# Patient Record
Sex: Female | Born: 1990 | Race: White | Hispanic: No | Marital: Married | Smoking: Never smoker
Health system: Southern US, Community
[De-identification: ages and names within clinical notes are randomized; demographics above are authoritative.]

## PROBLEM LIST (undated history)

## (undated) DIAGNOSIS — N920 Excessive and frequent menstruation with regular cycle: Secondary | ICD-10-CM

## (undated) DIAGNOSIS — T8859XA Other complications of anesthesia, initial encounter: Secondary | ICD-10-CM

## (undated) DIAGNOSIS — K219 Gastro-esophageal reflux disease without esophagitis: Secondary | ICD-10-CM

## (undated) DIAGNOSIS — Z8489 Family history of other specified conditions: Secondary | ICD-10-CM

## (undated) DIAGNOSIS — F419 Anxiety disorder, unspecified: Secondary | ICD-10-CM

## (undated) DIAGNOSIS — T4145XA Adverse effect of unspecified anesthetic, initial encounter: Secondary | ICD-10-CM

## (undated) DIAGNOSIS — Z87442 Personal history of urinary calculi: Secondary | ICD-10-CM

## (undated) DIAGNOSIS — R519 Headache, unspecified: Secondary | ICD-10-CM

## (undated) HISTORY — PX: ABDOMINAL HYSTERECTOMY: SHX81

## (undated) HISTORY — DX: Excessive and frequent menstruation with regular cycle: N92.0

## (undated) HISTORY — PX: TONSILLECTOMY: SUR1361

---
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
## 2013-11-11 DIAGNOSIS — L309 Dermatitis, unspecified: Secondary | ICD-10-CM | POA: Insufficient documentation

## 2015-01-04 ENCOUNTER — Encounter: Payer: Self-pay | Admitting: Emergency Medicine

## 2015-01-04 ENCOUNTER — Emergency Department (INDEPENDENT_AMBULATORY_CARE_PROVIDER_SITE_OTHER)
Admission: EM | Admit: 2015-01-04 | Discharge: 2015-01-04 | Disposition: A | Discharge status: Home / Self Care | Payer: 59 | Source: Home / Self Care | Attending: Family Medicine | Admitting: Family Medicine

## 2015-01-04 DIAGNOSIS — J029 Acute pharyngitis, unspecified: Secondary | ICD-10-CM

## 2015-01-04 DIAGNOSIS — R05 Cough: Secondary | ICD-10-CM

## 2015-01-04 DIAGNOSIS — R059 Cough, unspecified: Secondary | ICD-10-CM

## 2015-01-04 LAB — POCT RAPID STREP A (OFFICE): Rapid Strep A Screen: NEGATIVE

## 2015-01-04 MED ORDER — HYDROCODONE-HOMATROPINE 5-1.5 MG/5ML PO SYRP
5.0000 mL | ORAL_SOLUTION | Freq: Four times a day (QID) | ORAL | Status: DC | PRN
Start: 2015-01-04 — End: 2016-06-02

## 2015-01-04 NOTE — ED Provider Notes (Signed)
CSN: [id number]     Arrival date & time 01/04/15  1659 History   First MD Initiated Contact with Patient 01/04/15 1723     Chief Complaint  Patient presents with  . Sore Throat   (Consider location/radiation/quality/duration/timing/severity/associated sxs/prior Treatment) HPI  Pt is a 24yo female presenting to [facility] with c/o 3 day hx of gradually worsening sore throat with associated mild to moderately intermittent dry cough and hoarseness of voice.  Pt reports hx of strep but no recent known exposure.  She has not been trying any OTC medications PTA.  Denies fever, chills, n/v/d.  Denies body aches or shortness of breath.  History reviewed. No pertinent past medical history. Past Surgical History  Procedure Laterality Date  . Tonsillectomy     History reviewed. No pertinent family history. Social History  Substance Use Topics  . Smoking status: Never Smoker   . Smokeless tobacco: None  . Alcohol Use: Yes   OB History    No data available     Review of Systems  Constitutional: Positive for fatigue. Negative for fever and chills.  HENT: Positive for sore throat and voice change. Negative for congestion, ear pain and trouble swallowing.   Respiratory: Positive for cough and wheezing. Negative for shortness of breath.   Cardiovascular: Negative for chest pain and palpitations.  Gastrointestinal: Negative for nausea, vomiting, abdominal pain and diarrhea.  Musculoskeletal: Negative for myalgias, back pain and arthralgias.  Skin: Negative for rash.    Allergies  Review of patient's allergies indicates not on file.  Home Medications   Prior to Admission medications   Medication Sig Start Date End Date Taking? Authorizing Provider  HYDROcodone-homatropine (HYCODAN) 5-1.5 MG/5ML syrup Take 5 mLs by mouth every 6 (six) hours as needed for cough. 01/04/15   Junius Finner, PA-C   Meds Ordered and Administered this Visit  Medications - No data to display  BP 118/76 mmHg  Pulse 96   Temp(Src) 98.2 F (36.8 C) (Oral)  Wt 141 lb (63.957 kg)  SpO2 99% No data found.   Physical Exam  Constitutional: She appears well-developed and well-nourished. No distress.  HENT:  Head: Normocephalic and atraumatic.  Right Ear: Hearing, tympanic membrane, external ear and ear canal normal.  Left Ear: Hearing, tympanic membrane, external ear and ear canal normal.  Nose: Nose normal.  Mouth/Throat: Uvula is midline and mucous membranes are normal. Posterior oropharyngeal erythema present. No oropharyngeal exudate, posterior oropharyngeal edema or tonsillar abscesses.  Eyes: Conjunctivae are normal. No scleral icterus.  Neck: Normal range of motion. Neck supple.  Cardiovascular: Normal rate, regular rhythm and normal heart sounds.   Pulmonary/Chest: Effort normal and breath sounds normal. No respiratory distress. She has no wheezes. She has no rales. She exhibits no tenderness.  Abdominal: Soft. She exhibits no distension and no mass. There is no tenderness. There is no rebound and no guarding.  Musculoskeletal: Normal range of motion.  Lymphadenopathy:    She has no cervical adenopathy.  Neurological: She is alert.  Skin: Skin is warm and dry. She is not diaphoretic.  Nursing note and vitals reviewed.   ED Course  Procedures (including critical care time)  Labs Review Labs Reviewed  POCT RAPID STREP A (OFFICE)    Imaging Review No results found.    MDM   1. Acute pharyngitis, unspecified etiology   2. Cough    Pt c/o sore throat and cough for 3 days.  Rapid strep: negative  Reassured pt symptoms are likely viral. Encouraged conservative  treatment with fluids, rest, acetaminophen and ibuprofen. Pt states she has had tessalon before and it "does not work."  Advised she may try OTC cough medication.  Upon discharge pt requested a prescription cough medication Rx: hycodan. Advised it may cause drowsiness. Do not drive while taking. Pt declined work note. F/u with  PCP in 7-10 days if not improving, sooner if worsening. Patient verbalized understanding and agreement with treatment plan.     Junius FinnerErin O'Malley, PA-C 01/05/15 [id number]

## 2015-01-04 NOTE — ED Notes (Signed)
Pt c/o sore throat and dry cough x3 days. Denies fever.

## 2015-01-04 NOTE — Discharge Instructions (Signed)
You may take 400-600mg Ibuprofen (Motrin) every 6-8 hours for fever and pain  °Alternate with Tylenol  °You may take 500mg Tylenol every 4-6 hours as needed for fever and pain  °Follow-up with your primary care provider next week for recheck of symptoms if not improving.  °Be sure to drink plenty of fluids and rest, at least 8hrs of sleep a night, preferably more while you are sick. °Return urgent care or go to closest ER if you cannot keep down fluids/signs of dehydration, fever not reducing with Tylenol, difficulty breathing/wheezing, stiff neck, worsening condition, or other concerns (see below)  ° ° °Cool Mist Vaporizers °Vaporizers may help relieve the symptoms of a cough and cold. They add moisture to the air, which helps mucus to become thinner and less sticky. This makes it easier to breathe and cough up secretions. Cool mist vaporizers do not cause serious burns like hot mist vaporizers, which may also be called steamers or humidifiers. Vaporizers have not been proven to help with colds. You should not use a vaporizer if you are allergic to mold. °HOME CARE INSTRUCTIONS °· Follow the package instructions for the vaporizer. °· Do not use anything other than distilled water in the vaporizer. °· Do not run the vaporizer all of the time. This can cause mold or bacteria to grow in the vaporizer. °· Clean the vaporizer after each time it is used. °· Clean and dry the vaporizer well before storing it. °· Stop using the vaporizer if worsening respiratory symptoms develop. °  °This information is not intended to replace advice given to you by your health care provider. Make sure you discuss any questions you have with your health care provider. °  °Document Released: 10/01/2003 Document Revised: 01/08/2013 Document Reviewed: 05/23/2012 °Elsevier Interactive Patient Education ©2016 Elsevier Inc. ° °

## 2016-06-02 ENCOUNTER — Encounter: Payer: Self-pay | Admitting: Certified Nurse Midwife

## 2016-06-02 ENCOUNTER — Telehealth: Payer: Self-pay | Admitting: Certified Nurse Midwife

## 2016-06-02 ENCOUNTER — Ambulatory Visit (INDEPENDENT_AMBULATORY_CARE_PROVIDER_SITE_OTHER): Payer: 59 | Admitting: Certified Nurse Midwife

## 2016-06-02 VITALS — BP 120/70 | HR 70 | Wt 158.1 lb

## 2016-06-02 DIAGNOSIS — Z30011 Encounter for initial prescription of contraceptive pills: Secondary | ICD-10-CM

## 2016-06-02 DIAGNOSIS — Z30432 Encounter for removal of intrauterine contraceptive device: Secondary | ICD-10-CM | POA: Diagnosis not present

## 2016-06-02 MED ORDER — NORETHIN ACE-ETH ESTRAD-FE 1-20 MG-MCG PO TABS: 1.0000 | ORAL_TABLET | Freq: Every day | ORAL | 3 refills | Status: DC

## 2016-06-02 NOTE — Progress Notes (Signed)
Mary Parsons is a 26 y.o. year old [id number] Caucasian female who presents for removal of a Mirena IUD. Her Mirena IUD was placed 01/08/2016.   Patient's last menstrual period was 05/17/2016. BP 120/70   Pulse 70   Wt 158 lb 1.6 oz (71.7 kg)   LMP 05/17/2016 Comment: taking out today (06/02/16)  Time out was performed.  A medium plastic speculum was placed in the vagina.  The cervix was visualized, and the strings were visible. They were grasped and the Mirena was easily removed intact without complications.   Pt desires OCPs as pregnancy prevention. She was taking Junel prior to pregnancy and would like to resume.    Rx Junel 1/20 Fe, see orders. Advised back up method of prevention for 14 days.   Reviewed red flag symptoms and when to call including ACHES acronym.  RTC as needed.    Mary Parsons ,CNM

## 2016-06-02 NOTE — Patient Instructions (Signed)
Ethinyl Estradiol; Norethindrone Acetate; Ferrous fumarate tablets or capsules What is this medicine? ETHINYL ESTRADIOL; NORETHINDRONE ACETATE; FERROUS FUMARATE (ETH in il es tra DYE ole; nor eth IN drone AS e tate; FER us FUE ma rate) is an oral contraceptive. The products combine two types of female hormones, an estrogen and a progestin. They are used to prevent ovulation and pregnancy. Some products are also used to treat acne in females. This medicine may be used for other purposes; ask your health care provider or pharmacist if you have questions. COMMON BRAND NAME(S): Blisovi 24 Fe, Blisovi Fe, Estrostep Fe, Gildess 24 Fe, Gildess Fe 1.5/30, Gildess Fe 1/20, Junel Fe 1.5/30, Junel Fe 1/20, Junel Fe 24, Larin Fe, Lo Loestrin Fe, Loestrin 24 Fe, Loestrin FE 1.5/30, Loestrin FE 1/20, Lomedia 24 Fe, Microgestin 24 Fe, Microgestin Fe 1.5/30, Microgestin Fe 1/20, Tarina Fe 1/20, Taytulla, Tilia Fe, Tri-Legest Fe What should I tell my health care provider before I take this medicine? They need to know if you have any of these conditions: -abnormal vaginal bleeding -blood vessel disease -breast, cervical, endometrial, ovarian, liver, or uterine cancer -diabetes -gallbladder disease -heart disease or recent heart attack -high blood pressure -high cholesterol -history of blood clots -kidney disease -liver disease -migraine headaches -smoke tobacco -stroke -systemic lupus erythematosus (SLE) -an unusual or allergic reaction to estrogens, progestins, other medicines, foods, dyes, or preservatives -pregnant or trying to get pregnant -breast-feeding How should I use this medicine? Take this medicine by mouth. To reduce nausea, this medicine may be taken with food. Follow the directions on the prescription label. Take this medicine at the same time each day and in the order directed on the package. Do not take your medicine more often than directed. A patient package insert for the product will be  given with each prescription and refill. Read this sheet carefully each time. The sheet may change frequently. Contact your pediatrician regarding the use of this medicine in children. Special care may be needed. This medicine has been used in female children who have started having menstrual periods. Overdosage: If you think you have taken too much of this medicine contact a poison control center or emergency room at once. NOTE: This medicine is only for you. Do not share this medicine with others. What if I miss a dose? If you miss a dose, refer to the patient information sheet you received with your medicine for direction. If you miss more than one pill, this medicine may not be as effective and you may need to use another form of birth control. What may interact with this medicine? Do not take this medicine with the following medication: -dasabuvir; ombitasvir; paritaprevir; ritonavir -ombitasvir; paritaprevir; ritonavir This medicine may also interact with the following medications: -acetaminophen -antibiotics or medicines for infections, especially rifampin, rifabutin, rifapentine, and griseofulvin, and possibly penicillins or tetracyclines -aprepitant -ascorbic acid (vitamin C) -atorvastatin -barbiturate medicines, such as phenobarbital -bosentan -carbamazepine -caffeine -clofibrate -cyclosporine -dantrolene -doxercalciferol -felbamate -grapefruit juice -hydrocortisone -medicines for anxiety or sleeping problems, such as diazepam or temazepam -medicines for diabetes, including pioglitazone -mineral oil -modafinil -mycophenolate -nefazodone -oxcarbazepine -phenytoin -prednisolone -ritonavir or other medicines for HIV infection or AIDS -rosuvastatin -selegiline -soy isoflavones supplements -St. John's wort -tamoxifen or raloxifene -theophylline -thyroid hormones -topiramate -warfarin This list may not describe all possible interactions. Give your health care  provider a list of all the medicines, herbs, non-prescription drugs, or dietary supplements you use. Also tell them if you smoke, drink alcohol, or use illegal drugs. Some   items may interact with your medicine. What should I watch for while using this medicine? Visit your doctor or health care professional for regular checks on your progress. You will need a regular breast and pelvic exam and Pap smear while on this medicine. Use an additional method of contraception during the first cycle that you take these tablets. If you have any reason to think you are pregnant, stop taking this medicine right away and contact your doctor or health care professional. If you are taking this medicine for hormone related problems, it may take several cycles of use to see improvement in your condition. Smoking increases the risk of getting a blood clot or having a stroke while you are taking birth control pills, especially if you are more than 25 years old. You are strongly advised not to smoke. This medicine can make your body retain fluid, making your fingers, hands, or ankles swell. Your blood pressure can go up. Contact your doctor or health care professional if you feel you are retaining fluid. This medicine can make you more sensitive to the sun. Keep out of the sun. If you cannot avoid being in the sun, wear protective clothing and use sunscreen. Do not use sun lamps or tanning beds/booths. If you wear contact lenses and notice visual changes, or if the lenses begin to feel uncomfortable, consult your eye care specialist. In some women, tenderness, swelling, or minor bleeding of the gums may occur. Notify your dentist if this happens. Brushing and flossing your teeth regularly may help limit this. See your dentist regularly and inform your dentist of the medicines you are taking. If you are going to have elective surgery, you may need to stop taking this medicine before the surgery. Consult your health care  professional for advice. This medicine does not protect you against HIV infection (AIDS) or any other sexually transmitted diseases. What side effects may I notice from receiving this medicine? Side effects that you should report to your doctor or health care professional as soon as possible: -allergic reactions like skin rash, itching or hives, swelling of the face, lips, or tongue -breast tissue changes or discharge -changes in vaginal bleeding during your period or between your periods -changes in vision -chest pain -confusion -coughing up blood -dizziness -feeling faint or lightheaded -headaches or migraines -leg, arm or groin pain -loss of balance or coordination -severe or sudden headaches -stomach pain (severe) -sudden shortness of breath -sudden numbness or weakness of the face, arm or leg -symptoms of vaginal infection like itching, irritation or unusual discharge -tenderness in the upper abdomen -trouble speaking or understanding -vomiting -yellowing of the eyes or skin Side effects that usually do not require medical attention (report to your doctor or health care professional if they continue or are bothersome): -breakthrough bleeding and spotting that continues beyond the 3 initial cycles of pills -breast tenderness -mood changes, anxiety, depression, frustration, anger, or emotional outbursts -increased sensitivity to sun or ultraviolet light -nausea -skin rash, acne, or brown spots on the skin -weight gain (slight) This list may not describe all possible side effects. Call your doctor for medical advice about side effects. You may report side effects to FDA at 1-800-FDA-1088. Where should I keep my medicine? Keep out of the reach of children. Store at room temperature between 15 and 30 degrees C (59 and 86 degrees F). Throw away any unused medicine after the expiration date. NOTE: This sheet is a summary. It may not cover all possible information. If you   have  questions about this medicine, talk to your doctor, pharmacist, or health care provider.  2018 Elsevier/Gold Standard (2015-09-14 08:04:41)  

## 2016-06-02 NOTE — Telephone Encounter (Signed)
Please resend scripts to the [facility]  This has been updated in the chart

## 2016-06-06 MED ORDER — NORETHIN ACE-ETH ESTRAD-FE 1-20 MG-MCG PO TABS: 1.0000 | ORAL_TABLET | Freq: Every day | ORAL | 3 refills | Status: DC

## 2016-06-06 NOTE — Telephone Encounter (Signed)
Patient called again this am [facility]   Thanks

## 2016-06-06 NOTE — Telephone Encounter (Signed)
Refill sent to [facility].

## 2016-08-15 ENCOUNTER — Encounter: Payer: Self-pay | Admitting: Certified Nurse Midwife

## 2016-08-15 ENCOUNTER — Ambulatory Visit (INDEPENDENT_AMBULATORY_CARE_PROVIDER_SITE_OTHER): Payer: 59 | Admitting: Certified Nurse Midwife

## 2016-08-15 VITALS — BP 119/78 | HR 75 | Ht 62.0 in | Wt 153.0 lb

## 2016-08-15 DIAGNOSIS — N939 Abnormal uterine and vaginal bleeding, unspecified: Secondary | ICD-10-CM

## 2016-08-15 DIAGNOSIS — Z842 Family history of other diseases of the genitourinary system: Secondary | ICD-10-CM

## 2016-08-15 LAB — POCT URINE PREGNANCY: PREG TEST UR: NEGATIVE

## 2016-08-15 MED ORDER — IBUPROFEN 800 MG PO TABS: 800.0000 mg | ORAL_TABLET | Freq: Three times a day (TID) | ORAL | 1 refills | Status: DC | PRN

## 2016-08-15 NOTE — Patient Instructions (Signed)
Abnormal Uterine Bleeding Abnormal uterine bleeding can affect women at various stages in life, including teenagers, women in their reproductive years, pregnant women, and women who have reached menopause. Several kinds of uterine bleeding are considered abnormal, including:  Bleeding or spotting between periods.  Bleeding after sexual intercourse.  Bleeding that is heavier or more than normal.  Periods that last longer than usual.  Bleeding after menopause.  Many cases of abnormal uterine bleeding are minor and simple to treat, while others are more serious. Any type of abnormal bleeding should be evaluated by your health care provider. Treatment will depend on the cause of the bleeding. Follow these instructions at home: Monitor your condition for any changes. The following actions may help to alleviate any discomfort you are experiencing:  Avoid the use of tampons and douches as directed by your health care provider.  Change your pads frequently.  You should get regular pelvic exams and Pap tests. Keep all follow-up appointments for diagnostic tests as directed by your health care provider. Contact a health care provider if:  Your bleeding lasts more than 1 week.  You feel dizzy at times. Get help right away if:  You pass out.  You are changing pads every 15 to 30 minutes.  You have abdominal pain.  You have a fever.  You become sweaty or weak.  You are passing large blood clots from the vagina.  You start to feel nauseous and vomit. This information is not intended to replace advice given to you by your health care provider. Make sure you discuss any questions you have with your health care provider. Document Released: 01/03/2005 Document Revised: 06/17/2015 Document Reviewed: 08/02/2012 Elsevier Interactive Patient Education  2017 [employer]  Endometriosis Endometriosis is a condition in which the tissue that lines the uterus (endometrium) grows outside of its  normal location. The tissue may grow in many locations close to the uterus, but it commonly grows on the ovaries, fallopian tubes, vagina, or bowel. When the uterus sheds the endometrium every menstrual cycle, there is bleeding wherever the endometrial tissue is located. This can cause pain because blood is irritating to tissues that are not normally exposed to it. What are the causes? The cause of endometriosis is not known. What increases the risk? You may be more likely to develop endometriosis if you:  Have a family history of endometriosis.  Have never given birth.  Started your period at age 25 or younger.  Have high levels of estrogen in your body.  Were exposed to a certain medicine (diethylstilbestrol) before you were born (in utero).  Had low birth weight.  Were born as a twin, triplet, or other multiple.  Have a BMI of less than 25. BMI is an estimate of body fat and is calculated from height and weight.  What are the signs or symptoms? Often, there are no symptoms of this condition. If you do have symptoms, they may:  Vary depending on where your endometrial tissue is growing.  Occur during your menstrual period (most common) or midcycle.  Come and go, or you may go months with no symptoms at all.  Stop with menopause.  Symptoms may include:  Pain in the back or abdomen.  Heavier bleeding during periods.  Pain during sex.  Painful bowel movements.  Infertility.  Pelvic pain.  Bleeding more than once a month.  How is this diagnosed? This condition is diagnosed based on your symptoms and a physical exam. You may have tests, such as:  Blood tests and urine tests. These may be done to help rule out other possible causes of your symptoms.  Ultrasound, to look for abnormal tissues.  An X-ray of the lower bowel (barium enema).  An ultrasound that is done through the vagina (transvaginally).  CT scan.  MRI.  Laparoscopy. In this procedure, a  lighted, pencil-sized instrument called a laparoscope is inserted into your abdomen through an incision. The laparoscope allows your health care provider to look at the organs inside your body and check for abnormal tissue to confirm the diagnosis. If abnormal tissue is found, your health care provider may remove a small piece of tissue (biopsy) to be examined under a microscope.  How is this treated? Treatment for this condition may include:  Medicines to relieve pain, such as NSAIDs.  Hormone therapy. This involves using artificial (synthetic) hormones to reduce endometrial tissue growth. Your health care provider may recommend using a hormonal form of birth control, or other medicines.  Surgery. This may be done to remove abnormal endometrial tissue. ? In some cases, tissue may be removed using a laparoscope and a laser (laparoscopic laser treatment). ? In severe cases, surgery may be done to remove the fallopian tubes, uterus, and ovaries (hysterectomy).  Follow these instructions at home:  Take over-the-counter and prescription medicines only as told by your health care provider.  Do not drive or use heavy machinery while taking prescription pain medicine.  Try to avoid activities that cause pain, including sexual activity.  Keep all follow-up visits as told by your health care provider. This is important. Contact a health care provider if:  You have pain in the area between your hip bones (pelvic area) that occurs: ? Before, during, or after your period. ? In between your period and gets worse during your period. ? During or after sex. ? With bowel movements or urination, especially during your period.  You have problems getting pregnant.  You have a fever. Get help right away if:  You have severe pain that does not get better with medicine.  You have severe nausea and vomiting, or you cannot eat without vomiting.  You have pain that affects only the lower, right side of  your abdomen.  You have abdominal pain that gets worse.  You have abdominal swelling.  You have blood in your stool. This information is not intended to replace advice given to you by your health care provider. Make sure you discuss any questions you have with your health care provider. Document Released: 01/01/2000 Document Revised: 10/09/2015 Document Reviewed: 06/06/2015 Elsevier Interactive Patient Education  [employer]2018 Elsevier Inc.

## 2016-08-16 LAB — BETA HCG QUANT (REF LAB): hCG Quant: <1 m[IU]/mL

## 2016-08-16 LAB — CBC
HEMATOCRIT: 43 % (ref 34.0–46.6)
Hemoglobin: 14.7 g/dL (ref 11.1–15.9)
MCH: 31.3 pg (ref 26.6–33.0)
MCHC: 34.2 g/dL (ref 31.5–35.7)
MCV: 92 fL (ref 79–97)
PLATELETS: 284 10*3/uL (ref 150–379)
RBC: 4.7 x10E6/uL (ref 3.77–5.28)
RDW: 12.6 % (ref 12.3–15.4)
WBC: 6 10*3/uL (ref 3.4–10.8)

## 2016-08-16 LAB — FSH/LH
FSH: 6.1 m[IU]/mL
LH: 7.8 m[IU]/mL

## 2016-08-16 LAB — ESTRADIOL: ESTRADIOL: 23.8 pg/mL

## 2016-08-16 LAB — PROLACTIN: PROLACTIN: 8.4 ng/mL (ref 4.8–23.3)

## 2016-08-16 LAB — TSH: TSH: 1.67 u[IU]/mL (ref 0.450–4.500)

## 2016-08-16 LAB — FERRITIN: FERRITIN: 164 ng/mL — AB (ref 15–150)

## 2016-08-16 NOTE — Progress Notes (Signed)
GYN ENCOUNTER NOTE  Subjective:       Mary Parsons is a 26 y.o. [id number] female is here for gynecologic evaluation of the following issues: abnormal uterine bleeding.   Mary CoolerChristen reports heavy vaginal bleeding with small clots x 1 day. Bleeding saturates one (1) tampon per hour.   Denies difficulty breathing or respiratory distress, chest pain, abdominal pain, dysuria, dyspareunia, and leg pain or swelling.   Family history significant for endometriosis and several relatives had hysterectomies prior to age 25.      Gynecologic History  Patient's last menstrual period was 08/01/2016.  Contraception: OCP (estrogen/progesterone)  Last Pap: unknown.   Obstetric History  OB History  Gravida Para Term Preterm AB Living  1 1 1     1   SAB TAB Ectopic Multiple Live Births          1    # Outcome Date GA Lbr Len/2nd Weight Sex Delivery Anes PTL Lv  1 Term 08/21/15 [id number]  4 lb 2.2 oz (1.878 kg) M CS-Unspec   LIV     Complications: Fetal Intolerance      Past Medical History:  Diagnosis Date  . Heavy periods     Past Surgical History:  Procedure Laterality Date  . CESAREAN SECTION    . TONSILLECTOMY      Current Outpatient Prescriptions on File Prior to Visit  Medication Sig Dispense Refill  . norethindrone-ethinyl estradiol (JUNEL FE,GILDESS FE,LOESTRIN FE) 1-20 MG-MCG tablet Take 1 tablet by mouth daily. 3 Package 3   No current facility-administered medications on file prior to visit.     No Known Allergies  Social History   Social History  . Marital status: Married    Spouse name: N/A  . Number of children: N/A  . Years of education: N/A   Occupational History  . Not on file.   Social History Main Topics  . Smoking status: Never Smoker  . Smokeless tobacco: Never Used  . Alcohol use Yes  . Drug use: No  . Sexual activity: Yes   Other Topics Concern  . Not on file   Social History Narrative  . No narrative on file    Family History  Problem  Relation Age of Onset  . Endometriosis Mother   . Endometriosis Maternal Aunt   . Endometriosis Maternal Grandmother     The following portions of the patient's history were reviewed and updated as appropriate: allergies, current medications, past family history, past medical history, past social history, past surgical history and problem list.  Review of Systems  Review of Systems - Negative except as noted above.  History obtained from the patient.   Objective:   BP 119/78   Pulse 75   Ht 5\' 2"  (1.575 m)   Wt 153 lb (69.4 kg)   LMP 08/01/2016   BMI 27.98 kg/m    CONSTITUTIONAL: Well-developed, well-nourished female in no acute distress.   ABDOMEN: Soft, non distended; Non tender.  No Organomegaly.  PELVIC:  External Genitalia: Normall  Vagina: Normal  Cervix: Normal, NuSwab collected  Uterus: Normal size, shape,consistency, mobile  Adnexa: Normal  UPT negative.   Assessment:   1. Abnormal uterine bleeding  - CBC - Ferritin - Estradiol - FSH/LH - TSH - Prolactin - Beta HCG, Quant - NuSwab Vaginitis Plus (VG+) - US Pelvis Complete; Future - US Transvaginal Non-OB; Future - POCT urine pregnancy  2. Family history of endometriosis  - US Pelvis Complete; Future - US Transvaginal Non-OB; Future  Plan:   Labs: See orders.   NuSwab collected will contact pt with results.   Reviewed red flag symptoms and when to call.   RTC x 1 weeks for ultrasound, RTC x 2 weeks for results review.    Gunnar BullaJenkins Michelle Terriona Horlacher, CNM

## 2016-08-19 LAB — NUSWAB VAGINITIS PLUS (VG+)
CANDIDA ALBICANS, NAA: NEGATIVE
CANDIDA GLABRATA, NAA: NEGATIVE
Chlamydia trachomatis, NAA: NEGATIVE
NEISSERIA GONORRHOEAE, NAA: NEGATIVE
Trich vag by NAA: NEGATIVE

## 2016-08-22 ENCOUNTER — Ambulatory Visit (INDEPENDENT_AMBULATORY_CARE_PROVIDER_SITE_OTHER): Payer: 59

## 2016-08-22 ENCOUNTER — Encounter: Payer: Self-pay | Admitting: Certified Nurse Midwife

## 2016-08-22 DIAGNOSIS — Z842 Family history of other diseases of the genitourinary system: Secondary | ICD-10-CM

## 2016-08-22 DIAGNOSIS — N939 Abnormal uterine and vaginal bleeding, unspecified: Secondary | ICD-10-CM | POA: Diagnosis not present

## 2016-08-26 ENCOUNTER — Ambulatory Visit (INDEPENDENT_AMBULATORY_CARE_PROVIDER_SITE_OTHER): Payer: 59 | Admitting: Certified Nurse Midwife

## 2016-08-26 ENCOUNTER — Encounter: Payer: Self-pay | Admitting: Certified Nurse Midwife

## 2016-08-26 VITALS — BP 112/61 | HR 71 | Ht 62.0 in | Wt 154.0 lb

## 2016-08-26 DIAGNOSIS — N939 Abnormal uterine and vaginal bleeding, unspecified: Secondary | ICD-10-CM

## 2016-08-26 DIAGNOSIS — Z842 Family history of other diseases of the genitourinary system: Secondary | ICD-10-CM

## 2016-08-26 DIAGNOSIS — Z09 Encounter for follow-up examination after completed treatment for conditions other than malignant neoplasm: Secondary | ICD-10-CM | POA: Diagnosis not present

## 2016-08-26 NOTE — Patient Instructions (Signed)
Endometriosis Endometriosis is a condition in which the tissue that lines the uterus (endometrium) grows outside of its normal location. The tissue may grow in many locations close to the uterus, but it commonly grows on the ovaries, fallopian tubes, vagina, or bowel. When the uterus sheds the endometrium every menstrual cycle, there is bleeding wherever the endometrial tissue is located. This can cause pain because blood is irritating to tissues that are not normally exposed to it. What are the causes? The cause of endometriosis is not known. What increases the risk? You may be more likely to develop endometriosis if you:  Have a family history of endometriosis.  Have never given birth.  Started your period at age 10 or younger.  Have high levels of estrogen in your body.  Were exposed to a certain medicine (diethylstilbestrol) before you were born (in utero).  Had low birth weight.  Were born as a twin, triplet, or other multiple.  Have a BMI of less than 25. BMI is an estimate of body fat and is calculated from height and weight.  What are the signs or symptoms? Often, there are no symptoms of this condition. If you do have symptoms, they may:  Vary depending on where your endometrial tissue is growing.  Occur during your menstrual period (most common) or midcycle.  Come and go, or you may go months with no symptoms at all.  Stop with menopause.  Symptoms may include:  Pain in the back or abdomen.  Heavier bleeding during periods.  Pain during sex.  Painful bowel movements.  Infertility.  Pelvic pain.  Bleeding more than once a month.  How is this diagnosed? This condition is diagnosed based on your symptoms and a physical exam. You may have tests, such as:  Blood tests and urine tests. These may be done to help rule out other possible causes of your symptoms.  Ultrasound, to look for abnormal tissues.  An X-ray of the lower bowel (barium enema).  An  ultrasound that is done through the vagina (transvaginally).  CT scan.  MRI.  Laparoscopy. In this procedure, a lighted, pencil-sized instrument called a laparoscope is inserted into your abdomen through an incision. The laparoscope allows your health care provider to look at the organs inside your body and check for abnormal tissue to confirm the diagnosis. If abnormal tissue is found, your health care provider may remove a small piece of tissue (biopsy) to be examined under a microscope.  How is this treated? Treatment for this condition may include:  Medicines to relieve pain, such as NSAIDs.  Hormone therapy. This involves using artificial (synthetic) hormones to reduce endometrial tissue growth. Your health care provider may recommend using a hormonal form of birth control, or other medicines.  Surgery. This may be done to remove abnormal endometrial tissue. ? In some cases, tissue may be removed using a laparoscope and a laser (laparoscopic laser treatment). ? In severe cases, surgery may be done to remove the fallopian tubes, uterus, and ovaries (hysterectomy).  Follow these instructions at home:  Take over-the-counter and prescription medicines only as told by your health care provider.  Do not drive or use heavy machinery while taking prescription pain medicine.  Try to avoid activities that cause pain, including sexual activity.  Keep all follow-up visits as told by your health care provider. This is important. Contact a health care provider if:  You have pain in the area between your hip bones (pelvic area) that occurs: ? Before, during, or   after your period. ? In between your period and gets worse during your period. ? During or after sex. ? With bowel movements or urination, especially during your period.  You have problems getting pregnant.  You have a fever. Get help right away if:  You have severe pain that does not get better with medicine.  You have severe  nausea and vomiting, or you cannot eat without vomiting.  You have pain that affects only the lower, right side of your abdomen.  You have abdominal pain that gets worse.  You have abdominal swelling.  You have blood in your stool. This information is not intended to replace advice given to you by your health care provider. Make sure you discuss any questions you have with your health care provider. Document Released: 01/01/2000 Document Revised: 10/09/2015 Document Reviewed: 06/06/2015 Elsevier Interactive Patient Education  2018 [employer] Diagnostic Laparoscopy A diagnostic laparoscopy is a procedure to diagnose diseases in the abdomen. During the procedure, a thin, lighted, pencil-sized instrument called a laparoscope is inserted into the abdomen through an incision. The laparoscope allows your health care provider to look at the organs inside your body. Tell a health care provider about:  Any allergies you have.  All medicines you are taking, including vitamins, herbs, eye drops, creams, and over-the-counter medicines.  Any problems you or family members have had with anesthetic medicines.  Any blood disorders you have.  Any surgeries you have had.  Any medical conditions you have. What are the risks? Generally, this is a safe procedure. However, problems can occur, which may include:  Infection.  Bleeding.  Damage to other organs.  Allergic reaction to the anesthetics used during the procedure.  What happens before the procedure?  Do not eat or drink anything after midnight on the night before the procedure or as directed by your health care provider.  Ask your health care provider about: ? Changing or stopping your regular medicines. ? Taking medicines such as aspirin and ibuprofen. These medicines can thin your blood. Do not take these medicines before your procedure if your health care provider instructs you not to.  Plan to have someone take you home after  the procedure. What happens during the procedure?  You may be given a medicine to help you relax (sedative).  You will be given a medicine to make you sleep (general anesthetic).  Your abdomen will be inflated with a gas. This will make your organs easier to see.  Small incisions will be made in your abdomen.  A laparoscope and other small instruments will be inserted into the abdomen through the incisions.  A tissue sample may be removed from an organ in the abdomen for examination.  The instruments will be removed from the abdomen.  The gas will be released.  The incisions will be closed with stitches (sutures). What happens after the procedure? Your blood pressure, heart rate, breathing rate, and blood oxygen level will be monitored often until the medicines you were given have worn off. This information is not intended to replace advice given to you by your health care provider. Make sure you discuss any questions you have with your health care provider. Document Released: 04/11/2000 Document Revised: 05/14/2015 Document Reviewed: 08/16/2013 Elsevier Interactive Patient Education  2018 [employer] Leuprolide depot injection What is this medicine? LEUPROLIDE (loo PROE lide) is a man-made protein that acts like a natural hormone in the body. It decreases testosterone in men and decreases estrogen in women. In men, this  medicine is used to treat advanced prostate cancer. In women, some forms of this medicine may be used to treat endometriosis, uterine fibroids, or other female hormone-related problems. This medicine may be used for other purposes; ask your health care provider or pharmacist if you have questions. COMMON BRAND NAME(S): Eligard, Lupron Depot, Lupron Depot-Ped, Viadur What should I tell my health care provider before I take this medicine? They need to know if you have any of these conditions: -diabetes -heart disease or previous heart attack -high blood  pressure -high cholesterol -mental illness -osteoporosis -pain or difficulty passing urine -seizures -spinal cord metastasis -stroke -suicidal thoughts, plans, or attempt; a previous suicide attempt by you or a family member -tobacco smoker -unusual vaginal bleeding (women) -an unusual or allergic reaction to leuprolide, benzyl alcohol, other medicines, foods, dyes, or preservatives -pregnant or trying to get pregnant -breast-feeding How should I use this medicine? This medicine is for injection into a muscle or for injection under the skin. It is given by a health care professional in a hospital or clinic setting. The specific product will determine how it will be given to you. Make sure you understand which product you receive and how often you will receive it. Talk to your pediatrician regarding the use of this medicine in children. Special care may be needed. Overdosage: If you think you have taken too much of this medicine contact a poison control center or emergency room at once. NOTE: This medicine is only for you. Do not share this medicine with others. What if I miss a dose? It is important not to miss a dose. Call your doctor or health care professional if you are unable to keep an appointment. Depot injections: Depot injections are given either once-monthly, every 12 weeks, every 16 weeks, or every 24 weeks depending on the product you are prescribed. The product you are prescribed will be based on if you are female or female, and your condition. Make sure you understand your product and dosing. What may interact with this medicine? Do not take this medicine with any of the following medications: -chasteberry This medicine may also interact with the following medications: -herbal or dietary supplements, like black cohosh or DHEA -female hormones, like estrogens or progestins and birth control pills, patches, rings, or injections -female hormones, like testosterone This list may  not describe all possible interactions. Give your health care provider a list of all the medicines, herbs, non-prescription drugs, or dietary supplements you use. Also tell them if you smoke, drink alcohol, or use illegal drugs. Some items may interact with your medicine. What should I watch for while using this medicine? Visit your doctor or health care professional for regular checks on your progress. During the first weeks of treatment, your symptoms may get worse, but then will improve as you continue your treatment. You may get hot flashes, increased bone pain, increased difficulty passing urine, or an aggravation of nerve symptoms. Discuss these effects with your doctor or health care professional, some of them may improve with continued use of this medicine. Female patients may experience a menstrual cycle or spotting during the first months of therapy with this medicine. If this continues, contact your doctor or health care professional. What side effects may I notice from receiving this medicine? Side effects that you should report to your doctor or health care professional as soon as possible: -allergic reactions like skin rash, itching or hives, swelling of the face, lips, or tongue -breathing problems -chest pain -depression  or memory disorders -pain in your legs or groin -pain at site where injected or implanted -seizures -severe headache -swelling of the feet and legs -suicidal thoughts or other mood changes -visual changes -vomiting Side effects that usually do not require medical attention (report to your doctor or health care professional if they continue or are bothersome): -breast swelling or tenderness -decrease in sex drive or performance -diarrhea -hot flashes -loss of appetite -muscle, joint, or bone pains -nausea -redness or irritation at site where injected or implanted -skin problems or acne This list may not describe all possible side effects. Call your doctor  for medical advice about side effects. You may report side effects to FDA at 1-800-FDA-1088. Where should I keep my medicine? This drug is given in a hospital or clinic and will not be stored at home. NOTE: This sheet is a summary. It may not cover all possible information. If you have questions about this medicine, talk to your doctor, pharmacist, or health care provider.  2018 Elsevier/Gold Standard (2015-06-18 09:45:53) Medroxyprogesterone injection [Contraceptive] What is this medicine? MEDROXYPROGESTERONE (me DROX ee proe JES te rone) contraceptive injections prevent pregnancy. They provide effective birth control for 3 months. Depo-subQ Provera 104 is also used for treating pain related to endometriosis. This medicine may be used for other purposes; ask your health care provider or pharmacist if you have questions. COMMON BRAND NAME(S): Depo-Provera, Depo-subQ Provera 104 What should I tell my health care provider before I take this medicine? They need to know if you have any of these conditions: -frequently drink alcohol -asthma -blood vessel disease or a history of a blood clot in the lungs or legs -bone disease such as osteoporosis -breast cancer -diabetes -eating disorder (anorexia nervosa or bulimia) -high blood pressure -HIV infection or AIDS -kidney disease -liver disease -mental depression -migraine -seizures (convulsions) -stroke -tobacco smoker -vaginal bleeding -an unusual or allergic reaction to medroxyprogesterone, other hormones, medicines, foods, dyes, or preservatives -pregnant or trying to get pregnant -breast-feeding How should I use this medicine? Depo-Provera Contraceptive injection is given into a muscle. Depo-subQ Provera 104 injection is given under the skin. These injections are given by a health care professional. You must not be pregnant before getting an injection. The injection is usually given during the first 5 days after the start of a menstrual  period or 6 weeks after delivery of a baby. Talk to your pediatrician regarding the use of this medicine in children. Special care may be needed. These injections have been used in female children who have started having menstrual periods. Overdosage: If you think you have taken too much of this medicine contact a poison control center or emergency room at once. NOTE: This medicine is only for you. Do not share this medicine with others. What if I miss a dose? Try not to miss a dose. You must get an injection once every 3 months to maintain birth control. If you cannot keep an appointment, call and reschedule it. If you wait longer than 13 weeks between Depo-Provera contraceptive injections or longer than 14 weeks between Depo-subQ Provera 104 injections, you could get pregnant. Use another method for birth control if you miss your appointment. You may also need a pregnancy test before receiving another injection. What may interact with this medicine? Do not take this medicine with any of the following medications: -bosentan This medicine may also interact with the following medications: -aminoglutethimide -antibiotics or medicines for infections, especially rifampin, rifabutin, rifapentine, and griseofulvin -aprepitant -barbiturate medicines such  as phenobarbital or primidone -bexarotene -carbamazepine -medicines for seizures like ethotoin, felbamate, oxcarbazepine, phenytoin, topiramate -modafinil -St. John's wort This list may not describe all possible interactions. Give your health care provider a list of all the medicines, herbs, non-prescription drugs, or dietary supplements you use. Also tell them if you smoke, drink alcohol, or use illegal drugs. Some items may interact with your medicine. What should I watch for while using this medicine? This drug does not protect you against HIV infection (AIDS) or other sexually transmitted diseases. Use of this product may cause you to lose calcium  from your bones. Loss of calcium may cause weak bones (osteoporosis). Only use this product for more than 2 years if other forms of birth control are not right for you. The longer you use this product for birth control the more likely you will be at risk for weak bones. Ask your health care professional how you can keep strong bones. You may have a change in bleeding pattern or irregular periods. Many females stop having periods while taking this drug. If you have received your injections on time, your chance of being pregnant is very low. If you think you may be pregnant, see your health care professional as soon as possible. Tell your health care professional if you want to get pregnant within the next year. The effect of this medicine may last a long time after you get your last injection. What side effects may I notice from receiving this medicine? Side effects that you should report to your doctor or health care professional as soon as possible: -allergic reactions like skin rash, itching or hives, swelling of the face, lips, or tongue -breast tenderness or discharge -breathing problems -changes in vision -depression -feeling faint or lightheaded, falls -fever -pain in the abdomen, chest, groin, or leg -problems with balance, talking, walking -unusually weak or tired -yellowing of the eyes or skin Side effects that usually do not require medical attention (report to your doctor or health care professional if they continue or are bothersome): -acne -fluid retention and swelling -headache -irregular periods, spotting, or absent periods -temporary pain, itching, or skin reaction at site where injected -weight gain This list may not describe all possible side effects. Call your doctor for medical advice about side effects. You may report side effects to FDA at 1-800-FDA-1088. Where should I keep my medicine? This does not apply. The injection will be given to you by a health care  professional. NOTE: This sheet is a summary. It may not cover all possible information. If you have questions about this medicine, talk to your doctor, pharmacist, or health care provider.  2018 Elsevier/Gold Standard (2008-01-25 18:37:56) Norethindrone tablets (contraception) What is this medicine? NORETHINDRONE (nor eth IN drone) is an oral contraceptive. The product contains a female hormone known as a progestin. It is used to prevent pregnancy. This medicine may be used for other purposes; ask your health care provider or pharmacist if you have questions. COMMON BRAND NAME(S): Camila, Deblitane 28-Day, Errin, Heather, [place], Jolivette, [place], Nor-QD, Nora-BE, Norlyroc, Ortho Micronor, [employer] 28-Day What should I tell my health care provider before I take this medicine? They need to know if you have any of these conditions: -blood vessel disease or blood clots -breast, cervical, or vaginal cancer -diabetes -heart disease -kidney disease -liver disease -mental depression -migraine -seizures -stroke -vaginal bleeding -an unusual or allergic reaction to norethindrone, other medicines, foods, dyes, or preservatives -pregnant or trying to get pregnant -breast-feeding How should I use  this medicine? Take this medicine by mouth with a glass of water. You may take it with or without food. Follow the directions on the prescription label. Take this medicine at the same time each day and in the order directed on the package. Do not take your medicine more often than directed. Contact your pediatrician regarding the use of this medicine in children. Special care may be needed. This medicine has been used in female children who have started having menstrual periods. A patient package insert for the product will be given with each prescription and refill. Read this sheet carefully each time. The sheet may change frequently. Overdosage: If you think you have taken too much of this medicine  contact a poison control center or emergency room at once. NOTE: This medicine is only for you. Do not share this medicine with others. What if I miss a dose? Try not to miss a dose. Every time you miss a dose or take a dose late your chance of pregnancy increases. When 1 pill is missed (even if only 3 hours late), take the missed pill as soon as possible and continue taking a pill each day at the regular time (use a back up method of birth control for the next 48 hours). If more than 1 dose is missed, use an additional birth control method for the rest of your pill pack until menses occurs. Contact your health care professional if more than 1 dose has been missed. What may interact with this medicine? Do not take this medicine with any of the following medications: -amprenavir or fosamprenavir -bosentan This medicine may also interact with the following medications: -antibiotics or medicines for infections, especially rifampin, rifabutin, rifapentine, and griseofulvin, and possibly penicillins or tetracyclines -aprepitant -barbiturate medicines, such as phenobarbital -carbamazepine -felbamate -modafinil -oxcarbazepine -phenytoin -ritonavir or other medicines for HIV infection or AIDS -St. John's wort -topiramate This list may not describe all possible interactions. Give your health care provider a list of all the medicines, herbs, non-prescription drugs, or dietary supplements you use. Also tell them if you smoke, drink alcohol, or use illegal drugs. Some items may interact with your medicine. What should I watch for while using this medicine? Visit your doctor or health care professional for regular checks on your progress. You will need a regular breast and pelvic exam and Pap smear while on this medicine. Use an additional method of birth control during the first cycle that you take these tablets. If you have any reason to think you are pregnant, stop taking this medicine right away and  contact your doctor or health care professional. If you are taking this medicine for hormone related problems, it may take several cycles of use to see improvement in your condition. This medicine does not protect you against HIV infection (AIDS) or any other sexually transmitted diseases. What side effects may I notice from receiving this medicine? Side effects that you should report to your doctor or health care professional as soon as possible: -breast tenderness or discharge -pain in the abdomen, chest, groin or leg -severe headache -skin rash, itching, or hives -sudden shortness of breath -unusually weak or tired -vision or speech problems -yellowing of skin or eyes Side effects that usually do not require medical attention (report to your doctor or health care professional if they continue or are bothersome): -changes in sexual desire -change in menstrual flow -facial hair growth -fluid retention and swelling -headache -irritability -nausea -weight gain or loss This list may not describe  all possible side effects. Call your doctor for medical advice about side effects. You may report side effects to FDA at 1-800-FDA-1088. Where should I keep my medicine? Keep out of the reach of children. Store at room temperature between 15 and 30 degrees C (59 and 86 degrees F). Throw away any unused medicine after the expiration date. NOTE: This sheet is a summary. It may not cover all possible information. If you have questions about this medicine, talk to your doctor, pharmacist, or health care provider.  2018 Elsevier/Gold Standard (2011-09-23 16:41:35)

## 2016-08-29 ENCOUNTER — Encounter: Payer: Self-pay | Admitting: Certified Nurse Midwife

## 2016-08-29 ENCOUNTER — Encounter: Payer: 59 | Admitting: Certified Nurse Midwife

## 2016-08-31 ENCOUNTER — Encounter: Payer: Self-pay | Admitting: Certified Nurse Midwife

## 2016-09-01 NOTE — Progress Notes (Signed)
GYN ENCOUNTER NOTE  Subjective:       Mary Parsons is a 26 y.o. G45P1001 female here for results review. Pt seen in office on 08/15/2016 for evaluation of AUB. Lab work and [place] were ordered.   Denies difficulty breathing or respiratory distress, chest pain, abdominal pain, unexplained or excessive vaginal bleeding, and leg pain or swelling.    Gynecologic History  Patient's last menstrual period was 08/01/2016 (exact date).  Contraception: OCP (estrogen/progesterone)  Last Pap: unknown.   Obstetric History  OB History  Gravida Para Term Preterm AB Living  1 1 1     1   SAB TAB Ectopic Multiple Live Births          1    # Outcome Date GA Lbr Len/2nd Weight Sex Delivery Anes PTL Lv  1 Term 08/21/15 [redacted]w[redacted]d  4 lb 2.2 oz (1.878 kg) M CS-Unspec   LIV     Complications: Fetal Intolerance      Past Medical History:  Diagnosis Date  . Heavy periods     Past Surgical History:  Procedure Laterality Date  . CESAREAN SECTION    . TONSILLECTOMY      Current Outpatient Prescriptions on File Prior to Visit  Medication Sig Dispense Refill  . ibuprofen (ADVIL,MOTRIN) 800 MG tablet Take 1 tablet (800 mg total) by mouth every 8 (eight) hours as needed. 60 tablet 1  . norethindrone-ethinyl estradiol (JUNEL FE,GILDESS FE,LOESTRIN FE) 1-20 MG-MCG tablet Take 1 tablet by mouth daily. 3 Package 3   No current facility-administered medications on file prior to visit.     No Known Allergies  Social History   Social History  . Marital status: Married    Spouse name: N/A  . Number of children: N/A  . Years of education: N/A   Occupational History  . Not on file.   Social History Main Topics  . Smoking status: Never Smoker  . Smokeless tobacco: Never Used  . Alcohol use Yes     Comment: occas  . Drug use: No  . Sexual activity: Yes    Birth control/ protection: Pill   Other Topics Concern  . Not on file   Social History Narrative  . No narrative on file    Family  History  Problem Relation Age of Onset  . Endometriosis Mother   . Endometriosis Maternal Aunt   . Endometriosis Maternal Grandmother     The following portions of the patient's history were reviewed and updated as appropriate: allergies, current medications, past family history, past medical history, past social history, past surgical history and problem list.  Review of Systems Review of Systems - Negative except as noted above.  History obtained from the patient.   Objective:   BP 112/61   Pulse 71   Ht 5\' 2"  (1.575 m)   Wt 154 lb (69.9 kg)   LMP 08/01/2016 (Exact Date)   Breastfeeding? No   BMI 28.17 kg/m   Alert and oriented x 4, no apparent distress.   Physical exam: not indicated.   Labs:   Recent Results (from the past 2160 hour(s))  CBC     Status: None   Collection Time: 08/15/16 11:25 AM  Result Value Ref Range   WBC 6.0 3.4 - 10.8 x10E3/uL   RBC 4.70 3.77 - 5.28 x10E6/uL   Hemoglobin 14.7 11.1 - 15.9 g/dL   Hematocrit 16.1 09.6 - 46.6 %   MCV 92 79 - 97 fL   MCH 31.3 26.6 - 33.0  pg   MCHC 34.2 31.5 - 35.7 g/dL   RDW 16.112.6 09.612.3 - 04.515.4 %   Platelets 284 150 - 379 x10E3/uL  Ferritin     Status: Abnormal   Collection Time: 08/15/16 11:25 AM  Result Value Ref Range   Ferritin 164 (H) 15 - 150 ng/mL  Estradiol     Status: None   Collection Time: 08/15/16 11:25 AM  Result Value Ref Range   Estradiol 23.8 pg/mL    Comment:                     Adult Female:                       Follicular phase   12.5 -   166.0                       Ovulation phase    85.8 -   498.0                       Luteal phase       43.8 -   211.0                       Postmenopausal     <6.0 -    54.7                     Pregnancy                       1st trimester     215.0 - >4300.0                     Girls (1-10 years)    6.0 -    27.0 Roche ECLIA methodology   FSH/LH     Status: None   Collection Time: 08/15/16 11:25 AM  Result Value Ref Range   LH 7.8 mIU/mL     Comment:                     Adult Female:                       Follicular phase      2.4 -  12.6                       Ovulation phase      14.0 -  95.6                       Luteal phase          1.0 -  11.4                       Postmenopausal        7.7 -  58.5    FSH 6.1 mIU/mL    Comment:                     Adult Female:                       Follicular phase      3.5 -  12.5  Ovulation phase       4.7 -  21.5                       Luteal phase          1.7 -   7.7                       Postmenopausal       25.8 - 134.8   TSH     Status: None   Collection Time: 08/15/16 11:25 AM  Result Value Ref Range   TSH 1.670 0.450 - 4.500 uIU/mL  Prolactin     Status: None   Collection Time: 08/15/16 11:25 AM  Result Value Ref Range   Prolactin 8.4 4.8 - 23.3 ng/mL  Beta HCG, Quant     Status: None   Collection Time: 08/15/16 11:25 AM  Result Value Ref Range   hCG Quant <1 mIU/mL    Comment:                      Female (Non-pregnant)    0 -     5                             (Postmenopausal)  0 -     8                      Female (Pregnant)                      Weeks of Gestation                              3                6 -    71                              4               10 -   750                              5              217 -  7138                              6              158 - 31795                              7             3697 -130865                              8            32065 -784696                              9  16109 -151410                             10            46509 -604540                             12            27832 -210612                             14            13950 - 62530                             15            12039 - [phone] Roche  E CLIA methodology   POCT urine pregnancy     Status: None   Collection Time: 08/15/16 11:32 AM  Result Value Ref Range   Preg Test, Ur Negative Negative  NuSwab Vaginitis Plus (VG+)     Status: None   Collection Time: 08/15/16 11:37 AM  Result Value Ref Range   Atopobium vaginae Low - 0 Score   BVAB 2 Low - 0 Score   Megasphaera 1 Low - 0 Score    Comment: Calculate total score by adding the 3 individual bacterial vaginosis (BV) marker scores together.  Total score is interpreted as follows: Total score 0-1: Indicates the absence of BV. Total score   2: Indeterminate for BV. Additional clinical                  data should be evaluated to establish a                  diagnosis. Total score 3-6: Indicates the presence of BV. This test was developed and its performance characteristics determined by [facility].  It has not been cleared or approved by the Food and Drug Administration.  The FDA has determined that such clearance or approval is not necessary.    Candida albicans, NAA Negative Negative   Candida glabrata, NAA Negative Negative    Comment: This test was developed and its performance characteristics determined by [facility].  It has not been cleared or approved by the Food and Drug Administration.  The FDA has determined that such clearance or approval is not necessary.    Trich vag by NAA Negative Negative   Chlamydia trachomatis, NAA Negative Negative   Neisseria gonorrhoeae, NAA Negative Negative  ULTRASOUND REPORT  Location: [facility] Date of Service: 08/22/16   Indications: AUB and a family history of endometriosis Findings:  The uterus is anteverted and measures 8.2 x 3.4 x 4.5 cm. Echo texture is homogenous without evidence of focal masses.  The Endometrium measures 2.5 mm.  Right Ovary measures 2.9 x 1.9 x 1.7 cm, and appears WNL. Left Ovary measures 2.8 x 1.9 x 1.5 cm, and appears WNL. Survey of the adnexa demonstrates no adnexal  masses. There is no free fluid in the cul de sac.  Impression: 1. Normal appearing pelvic ultrasound.  Recommendations: 1.Clinical correlation with the patient's History and Physical Exam.    Assessment:   1. Follow up   2. Abnormal uterine bleeding   3. Family history of endometriosis   Plan:   Labs reviewed with patient and mother. Findings all within normal limits, pt verbalized understanding.   Discussed diagnosis options for endometriosis including MD referral and possible exploratory laparoscopy.   Education regarding treatment options including POPs, Depo, Mirena, and Lupron as well as associated risk and benefits.   Pt will contact clinic with desired treatment method.   Reviewed red flag symptoms and when to call.   RTC as needed.    Gunnar Bulla, CNM

## 2016-09-12 ENCOUNTER — Encounter: Payer: Self-pay | Admitting: Certified Nurse Midwife

## 2016-09-13 ENCOUNTER — Encounter: Payer: Self-pay | Admitting: Certified Nurse Midwife

## 2016-09-13 ENCOUNTER — Ambulatory Visit (INDEPENDENT_AMBULATORY_CARE_PROVIDER_SITE_OTHER): Payer: 59 | Admitting: Certified Nurse Midwife

## 2016-09-13 VITALS — BP 123/76 | HR 67 | Wt 156.1 lb

## 2016-09-13 DIAGNOSIS — R102 Pelvic and perineal pain: Secondary | ICD-10-CM

## 2016-09-13 DIAGNOSIS — N926 Irregular menstruation, unspecified: Secondary | ICD-10-CM | POA: Diagnosis not present

## 2016-09-13 DIAGNOSIS — N941 Unspecified dyspareunia: Secondary | ICD-10-CM

## 2016-09-13 DIAGNOSIS — Z842 Family history of other diseases of the genitourinary system: Secondary | ICD-10-CM

## 2016-09-13 NOTE — Patient Instructions (Signed)
Endometriosis Endometriosis is a condition in which the tissue that lines the uterus (endometrium) grows outside of its normal location. The tissue may grow in many locations close to the uterus, but it commonly grows on the ovaries, fallopian tubes, vagina, or bowel. When the uterus sheds the endometrium every menstrual cycle, there is bleeding wherever the endometrial tissue is located. This can cause pain because blood is irritating to tissues that are not normally exposed to it. What are the causes? The cause of endometriosis is not known. What increases the risk? You may be more likely to develop endometriosis if you:  Have a family history of endometriosis.  Have never given birth.  Started your period at age 10 or younger.  Have high levels of estrogen in your body.  Were exposed to a certain medicine (diethylstilbestrol) before you were born (in utero).  Had low birth weight.  Were born as a twin, triplet, or other multiple.  Have a BMI of less than 25. BMI is an estimate of body fat and is calculated from height and weight.  What are the signs or symptoms? Often, there are no symptoms of this condition. If you do have symptoms, they may:  Vary depending on where your endometrial tissue is growing.  Occur during your menstrual period (most common) or midcycle.  Come and go, or you may go months with no symptoms at all.  Stop with menopause.  Symptoms may include:  Pain in the back or abdomen.  Heavier bleeding during periods.  Pain during sex.  Painful bowel movements.  Infertility.  Pelvic pain.  Bleeding more than once a month.  How is this diagnosed? This condition is diagnosed based on your symptoms and a physical exam. You may have tests, such as:  Blood tests and urine tests. These may be done to help rule out other possible causes of your symptoms.  Ultrasound, to look for abnormal tissues.  An X-ray of the lower bowel (barium enema).  An  ultrasound that is done through the vagina (transvaginally).  CT scan.  MRI.  Laparoscopy. In this procedure, a lighted, pencil-sized instrument called a laparoscope is inserted into your abdomen through an incision. The laparoscope allows your health care provider to look at the organs inside your body and check for abnormal tissue to confirm the diagnosis. If abnormal tissue is found, your health care provider may remove a small piece of tissue (biopsy) to be examined under a microscope.  How is this treated? Treatment for this condition may include:  Medicines to relieve pain, such as NSAIDs.  Hormone therapy. This involves using artificial (synthetic) hormones to reduce endometrial tissue growth. Your health care provider may recommend using a hormonal form of birth control, or other medicines.  Surgery. This may be done to remove abnormal endometrial tissue. ? In some cases, tissue may be removed using a laparoscope and a laser (laparoscopic laser treatment). ? In severe cases, surgery may be done to remove the fallopian tubes, uterus, and ovaries (hysterectomy).  Follow these instructions at home:  Take over-the-counter and prescription medicines only as told by your health care provider.  Do not drive or use heavy machinery while taking prescription pain medicine.  Try to avoid activities that cause pain, including sexual activity.  Keep all follow-up visits as told by your health care provider. This is important. Contact a health care provider if:  You have pain in the area between your hip bones (pelvic area) that occurs: ? Before, during, or   after your period. ? In between your period and gets worse during your period. ? During or after sex. ? With bowel movements or urination, especially during your period.  You have problems getting pregnant.  You have a fever. Get help right away if:  You have severe pain that does not get better with medicine.  You have severe  nausea and vomiting, or you cannot eat without vomiting.  You have pain that affects only the lower, right side of your abdomen.  You have abdominal pain that gets worse.  You have abdominal swelling.  You have blood in your stool. This information is not intended to replace advice given to you by your health care provider. Make sure you discuss any questions you have with your health care provider. Document Released: 01/01/2000 Document Revised: 10/09/2015 Document Reviewed: 06/06/2015 Elsevier Interactive Patient Education  2018 [employer]  

## 2016-09-14 LAB — BETA HCG QUANT (REF LAB)

## 2016-09-20 ENCOUNTER — Encounter: Payer: Self-pay | Admitting: Certified Nurse Midwife

## 2016-09-25 DIAGNOSIS — N941 Unspecified dyspareunia: Secondary | ICD-10-CM | POA: Insufficient documentation

## 2016-09-25 DIAGNOSIS — R102 Pelvic and perineal pain: Secondary | ICD-10-CM | POA: Insufficient documentation

## 2016-09-25 NOTE — Progress Notes (Signed)
GYN ENCOUNTER NOTE  Subjective:       Mary Parsons is a 26 y.o. 161P1001 female here with her spouse to discussed treatment for abnormal uterine bleeding and possible endometriosis.   Patient stopped taking her OCPs after one (1) month and has yet to have a period. She took a pregnancy test on Sunday, that was negative. Her pelvic pain and dyspareunia persist.    Denies difficulty breathing or respiratory distress, chest pain, abdominal pain, excessive vaginal bleeding, dysuria, and leg pain or swelling.    Gynecologic History  No LMP recorded. Patient is not currently having periods (Reason: Other).  Contraception: none  Last Pap: unknown.   Obstetric History  OB History  Gravida Para Term Preterm AB Living  1 1 1     1   SAB TAB Ectopic Multiple Live Births          1    # Outcome Date GA Lbr Len/2nd Weight Sex Delivery Anes PTL Lv  1 Term 08/21/15 [id number]  4 lb 2.2 oz (1.878 kg) M CS-Unspec   LIV     Complications: Fetal Intolerance      Past Medical History:  Diagnosis Date  . Heavy periods     Past Surgical History:  Procedure Laterality Date  . CESAREAN SECTION    . TONSILLECTOMY      Current Outpatient Prescriptions on File Prior to Visit  Medication Sig Dispense Refill  . ibuprofen (ADVIL,MOTRIN) 800 MG tablet Take 1 tablet (800 mg total) by mouth every 8 (eight) hours as needed. 60 tablet 1  . norethindrone-ethinyl estradiol (JUNEL FE,GILDESS FE,LOESTRIN FE) 1-20 MG-MCG tablet Take 1 tablet by mouth daily. 3 Package 3   No current facility-administered medications on file prior to visit.     No Known Allergies  Social History   Social History  . Marital status: Married    Spouse name: N/A  . Number of children: N/A  . Years of education: N/A   Occupational History  . Not on file.   Social History Main Topics  . Smoking status: Never Smoker  . Smokeless tobacco: Never Used  . Alcohol use Yes     Comment: occas  . Drug use: No  . Sexual  activity: Yes    Birth control/ protection: Pill   Other Topics Concern  . Not on file   Social History Narrative  . No narrative on file    Family History  Problem Relation Age of Onset  . Endometriosis Mother   . Endometriosis Maternal Aunt   . Endometriosis Maternal Grandmother     The following portions of the patient's history were reviewed and updated as appropriate: allergies, current medications, past family history, past medical history, past social history, past surgical history and problem list.  Review of Systems  Review of Systems - Negative except as noted above.  History obtained from the patient.   Objective:   BP 123/76   Pulse 67   Wt 156 lb 1.6 oz (70.8 kg)   BMI 28.55 kg/m   Alert and oriented x 4, no apparent distress.   Physical exam: not indicated.   Assessment:   1. Missed menses  - Beta HCG, Quant  2. Pelvic pain   3. Family history of endometriosis  Plan:   Encouraged patient to wait one (1) to two (2) months for return of menses due to recently removing Mirena and stopping OCPs.   Advised pregnancy prior to contraception use or exploratory surgery, if desired.  Reviewed forms of birth control options available to treat irregular menses and possible endometriosis including:  hormonal contraceptive medication including pill, patch, ring, injection,contraceptive implant; hormonal IUDs; Risks and benefits reviewed.    Patient questions need for surgery prior to treatment or next pregnancy. Requests MD referral.   Referral to Physical Therapy due to pelvic pain, see orders.   Reviewed red flag symptoms and when to call.   RTC x 2 weeks for follow up with Dr. Valentino Saxon.    Gunnar Bulla, CNM

## 2016-09-29 ENCOUNTER — Encounter: Payer: Self-pay | Admitting: Certified Nurse Midwife

## 2016-10-06 ENCOUNTER — Encounter: Payer: 59 | Admitting: Obstetrics and Gynecology

## 2016-10-28 ENCOUNTER — Ambulatory Visit (INDEPENDENT_AMBULATORY_CARE_PROVIDER_SITE_OTHER): Payer: 59 | Admitting: Certified Nurse Midwife

## 2016-10-28 ENCOUNTER — Encounter: Payer: Self-pay | Admitting: Certified Nurse Midwife

## 2016-10-28 VITALS — BP 128/67 | HR 86 | Wt 160.2 lb

## 2016-10-28 DIAGNOSIS — O26899 Other specified pregnancy related conditions, unspecified trimester: Secondary | ICD-10-CM | POA: Diagnosis not present

## 2016-10-28 DIAGNOSIS — Z8742 Personal history of other diseases of the female genital tract: Secondary | ICD-10-CM

## 2016-10-28 DIAGNOSIS — R109 Unspecified abdominal pain: Secondary | ICD-10-CM | POA: Diagnosis not present

## 2016-10-28 DIAGNOSIS — N926 Irregular menstruation, unspecified: Secondary | ICD-10-CM | POA: Diagnosis not present

## 2016-10-28 LAB — POCT URINE PREGNANCY: Preg Test, Ur: POSITIVE — AB

## 2016-10-28 NOTE — Progress Notes (Signed)
GYN ENCOUNTER NOTE  Subjective:       Mary Parsons is a 26 y.o. G72P1001 female here for pregnancy confirmation.   Reports four (4) positive home pregnancy test and intermittent lower abdominal cramping.   Denies difficulty breathing or respiratory distress, chest pain, vaginal bleeding, dysuria, and leg pain or swelling.    Gynecologic History  Patient's last menstrual period was 09/25/2016 (exact date).  Estimated Date of Birth: 07/02/2016.   Gestational age: 45 weeks 5 days.   Obstetric History  OB History  Gravida Para Term Preterm AB Living  SAB TAB Ectopic Multiple Live Births          1    # Outcome Date GA Lbr Len/2nd Weight Sex Delivery Anes PTL Lv  2 Current           1 Term 08/21/15 [redacted]w[redacted]d  4 lb 2.2 oz (1.878 kg) M CS-Unspec   LIV     Complications: Fetal Intolerance      Past Medical History:  Diagnosis Date  . Heavy periods     Past Surgical History:  Procedure Laterality Date  . CESAREAN SECTION    . TONSILLECTOMY     No Known Allergies  Social History   Social History  . Marital status: Married    Spouse name: N/A  . Number of children: N/A  . Years of education: N/A   Occupational History  . Not on file.   Social History Main Topics  . Smoking status: Never Smoker  . Smokeless tobacco: Never Used  . Alcohol use Yes     Comment: occas  . Drug use: No  . Sexual activity: Yes    Birth control/ protection: None   Other Topics Concern  . Not on file   Social History Narrative  . No narrative on file    Family History  Problem Relation Age of Onset  . Endometriosis Mother   . Endometriosis Maternal Aunt   . Endometriosis Maternal Grandmother     The following portions of the patient's history were reviewed and updated as appropriate: allergies, current medications, past family history, past medical history, past social history, past surgical history and problem list.  Review of Systems Review of Systems -  Negative except as noted above History obtained from the patient.   Objective:   BP 128/67   Pulse 86   Wt 160 lb 3.2 oz (72.7 kg)   LMP 09/25/2016 (Exact Date)   Breastfeeding? No   BMI 29.30 kg/m    CONSTITUTIONAL: Well-developed, well-nourished female in no acute distress.   Positive UPT  Physical exam: not indicated.   Assessment:   1. Missed menses  - POCT urine pregnancy - US OB Transvaginal; Future - Beta HCG, Quant - Progesterone  2. Abdominal cramping affecting pregnancy  - US OB Transvaginal; Future - Beta HCG, Quant - Progesterone  3. History of abnormal uterine bleeding  - Beta HCG, Quant - Progesterone   Plan:   Labs: Beta and Progesterone, see orders.   Reviewed red flag symptoms and when to call.   RTC x 3-4 weeks for dating/viability [place] and nurse intake.    Gunnar Bulla, CNM

## 2016-10-28 NOTE — Patient Instructions (Signed)
Trial of Labor After Cesarean Delivery A trial of labor after cesarean delivery (TOLAC) is when a woman tries to give birth vaginally after a previous cesarean delivery. TOLAC may be a safe and appropriate option for you depending on your medical history and other risk factors. When TOLAC is successful and you are able to have a vaginal delivery, this is called a vaginal birth after cesarean delivery (VBAC). Candidates for TOLAC TOLAC is possible for some women who:  Have undergone one or two prior cesarean deliveries in which the incision of the uterus was horizontal (low transverse).  Are carrying twins and have had one prior low transverse incision during a cesarean delivery.  Do not have a vertical (classical) uterine scar.  Have not had a tear in the wall of their uterus (uterine rupture).  TOLAC is also supported for women who meet appropriate criteria and:  Are under the age of 65 years.  Are tall and have a body mass index (BMI) of less than 30.  Have an unknown uterine scar.  Give birth in a facility equipped to handle an emergency cesarean delivery. This team should be able to handle possible complications such as a uterine rupture.  Have thorough counseling about the benefits and risks of TOLAC.  Have discussed future pregnancy plans with their health care provider.  Plan to have several more pregnancies.  Most successful candidates for TOLAC:  Have had a successful vaginal delivery before or after their cesarean delivery.  Experience labor that begins naturally on or before the due date (40 weeks of gestation).  Do not have a very large (macrosomic) baby.  Had a prior cesarean delivery but are not currently experiencing factors that would prompt a cesarean delivery (such as a breech position).  Had only one prior cesarean delivery.  Had a prior cesarean delivery that was performed early in labor and not after full cervical dilation. TOLAC may be most appropriate  for women who meet the above guidelines and who plan to have more pregnancies. TOLAC is not recommended for home births. Least successful candidates for TOLAC:  Have an induced labor with an unfavorable cervix. An unfavorable cervix is when the cervix is not dilating enough (among other factors).  Have never had a vaginal delivery.  Have had more than two cesarean deliveries.  Have a pregnancy at more than 40 weeks of gestation.  Are pregnant with a baby with a suspected weight greater than 4,000 grams (8 pounds) and who have no prior history of a vaginal delivery.  Have closely spaced pregnancies. Suggested benefits of TOLAC  You may have a faster recovery time.  You may have a shorter stay in the hospital.  You may have less pain and fewer problems than with a cesarean delivery. Women who have a cesarean delivery have a higher chance of needing blood or getting a fever, an infection, or a blood clot in the legs. Suggested risks of TOLAC The highest risk of complications happens to women who attempt a TOLAC and fail. A failed TOLAC results in an unplanned cesarean delivery. Risks related to [facility] or repeat cesarean deliveries include:  Blood loss.  Infection.  Blood clot.  Injury to surrounding tissues or organs.  Having to remove the uterus (hysterectomy).  Potential problems with the placenta (such as placenta previa or placenta accreta) in future pregnancies.  Although very rare, the main concerns with TOLAC are:  Rupture of the uterine scar from a past cesarean delivery.  Needing an emergency  cesarean delivery.  Having a bad outcome for the baby (perinatal morbidity).  Where to find more information:  American [employer] of [employer]st[employer]ians and Gynecologists: www.acog.[place]: www.midwife.org This information is not intended to replace advice given to you by your health care provider. Make sure you discuss any questions you have with  your health care provider. Document Released: 09/21/2010 Document Revised: 12/02/2015 Document Reviewed: 06/25/2012 Elsevier Interactive Patient Education  2018 [place] for Pregnant Women While you are pregnant, your body will require additional nutrition to help support your growing baby. It is recommended that you consume:  150 additional calories each day during your first trimester.  300 additional calories each day during your second trimester.  300 additional calories each day during your third trimester.  Eating a healthy, well-balanced diet is very important for your health and for your baby's health. You also have a higher need for some vitamins and minerals, such as folic acid, calcium, iron, and vitamin D. What do I need to know about eating during pregnancy?  Do not try to lose weight or go on a diet during pregnancy.  Choose healthy, nutritious foods. Choose  of a sandwich with a glass of milk instead of a candy bar or a high-calorie sugar-sweetened beverage.  Limit your overall intake of foods that have "empty calories." These are foods that have little nutritional value, such as sweets, desserts, candies, sugar-sweetened beverages, and fried foods.  Eat a variety of foods, especially fruits and vegetables.  Take a prenatal vitamin to help meet the additional needs during pregnancy, specifically for folic acid, iron, calcium, and vitamin D.  Remember to stay active. Ask your health care provider for exercise recommendations that are specific to you.  Practice good food safety and cleanliness, such as washing your hands before you eat and after you prepare raw meat. This helps to prevent foodborne illnesses, such as listeriosis, that can be very dangerous for your baby. Ask your health care provider for more information about listeriosis. What does 150 extra calories look like? Healthy options for an additional 150 calories each day could be any of the  following:  Plain low-fat yogurt (6-8 oz) with  cup of berries.  1 apple with 2 teaspoons of peanut butter.  Cut-up vegetables with  cup of hummus.  Low-fat chocolate milk (8 oz or 1 cup).  1 string cheese with 1 medium orange.   of a peanut butter and jelly sandwich on whole-wheat bread (1 tsp of peanut butter).  For 300 calories, you could eat two of those healthy options each day. What is a healthy amount of weight to gain? The recommended amount of weight for you to gain is based on your pre-pregnancy BMI. If your pre-pregnancy BMI was:  Less than 18 (underweight), you should gain 28-40 lb.  18-24.9 (normal), you should gain 25-35 lb.  25-29.9 (overweight), you should gain 15-25 lb.  Greater than 30 (obese), you should gain 11-20 lb.  What if I am having twins or multiples? Generally, pregnant women who will be having twins or multiples may need to increase their daily calories by 300-600 calories each day. The recommended range for total weight gain is 25-54 lb, depending on your pre-pregnancy BMI. Talk with your health care provider for specific guidance about additional nutritional needs, weight gain, and exercise during your pregnancy. What foods can I eat? Grains Any grains. Try to choose whole grains, such as whole-wheat bread, oatmeal, or brown rice.  Vegetables Any vegetables. Try to eat a variety of colors and types of vegetables to get a full range of vitamins and minerals. Remember to wash your vegetables well before eating. Fruits Any fruits. Try to eat a variety of colors and types of fruit to get a full range of vitamins and minerals. Remember to wash your fruits well before eating. Meats and Other Protein Sources Lean meats, including chicken, [place], fish, and lean cuts of beef, veal, or pork. Make sure that all meats are cooked to "well done." Tofu. Tempeh. Beans. Eggs. Peanut butter and other nut butters. Seafood, such as shrimp, crab, and lobster. If you  choose fish, select types that are higher in omega-3 fatty acids, including salmon, herring, mussels, trout, sardines, and pollock. Make sure that all meats are cooked to food-safe temperatures. Dairy Pasteurized milk and milk alternatives. Pasteurized yogurt and pasteurized cheese. Cottage cheese. Sour cream. Beverages Water. Juices that contain 100% fruit juice or vegetable juice. Caffeine-free teas and decaffeinated coffee. Drinks that contain caffeine are okay to drink, but it is better to avoid caffeine. Keep your total caffeine intake to less than 200 mg each day (12 oz of coffee, tea, or soda) or as directed by your health care provider. Condiments Any pasteurized condiments. Sweets and Desserts Any sweets and desserts. Fats and Oils Any fats and oils. The items listed above may not be a complete list of recommended foods or beverages. Contact your dietitian for more options. What foods are not recommended? Vegetables Unpasteurized (raw) vegetable juices. Fruits Unpasteurized (raw) fruit juices. Meats and Other Protein Sources Cured meats that have nitrates, such as bacon, salami, and hotdogs. Luncheon meats, bologna, or other deli meats (unless they are reheated until they are steaming hot). Refrigerated pate, meat spreads from a meat counter, smoked seafood that is found in the refrigerated section of a store. Raw fish, such as sushi or sashimi. High mercury content fish, such as tilefish, shark, swordfish, and king mackerel. Raw meats, such as tuna or beef tartare. Undercooked meats and poultry. Make sure that all meats are cooked to food-safe temperatures. Dairy Unpasteurized (raw) milk and any foods that have raw milk in them. Soft cheeses, such as feta, queso blanco, queso fresco, Brie, Camembert cheeses, blue-veined cheeses, and Panela cheese (unless it is made with pasteurized milk, which must be stated on the label). Beverages Alcohol. Sugar-sweetened beverages, such as sodas,  teas, or energy drinks. Condiments Homemade fermented foods and drinks, such as pickles, sauerkraut, or kombucha drinks. (Store-bought pasteurized versions of these are okay.) Other Salads that are made in the store, such as ham salad, chicken salad, egg salad, tuna salad, and seafood salad. The items listed above may not be a complete list of foods and beverages to avoid. Contact your dietitian for more information. This information is not intended to replace advice given to you by your health care provider. Make sure you discuss any questions you have with your health care provider. Document Released: 10/18/2013 Document Revised: 06/11/2015 Document Reviewed: 06/18/2013 Elsevier Interactive Patient Education  2018 [employer]. Common Medications Safe in Pregnancy  Acne:      Constipation:  Benzoyl Peroxide     Colace  Clindamycin      Dulcolax Suppository  [place] Erythromycin     Fibercon  Salicylic Acid      Metamucil         Miralax AVOID:        Senakot   Accutane    Cough:  Retin-A  Cough Drops  Tetracycline      Phenergan w/ Codeine if Rx  Minocycline      Robitussin (Plain & DM)  Antibiotics:     Crabs/Lice:  Ceclor       RID  Cephalosporins    AVOID:  E-Mycins      Kwell  Keflex  Macrobid/Macrodantin   Diarrhea:  Penicillin      Kao-Pectate  Zithromax      Imodium AD         PUSH FLUIDS AVOID:       Cipro     Fever:  Tetracycline      Tylenol (Regular or Extra  Minocycline       Strength)  Levaquin      Extra Strength-Do not          Exceed 8 tabs/24 hrs Caffeine:        200mg /day (equiv. To 1 cup of coffee or  approx. 3 12 oz sodas)         Gas: Cold/Hayfever:       Gas-X  Benadryl      Mylicon  Claritin       Phazyme  **Claritin-D        Chlor-Trimeton    Headaches:  Dimetapp      ASA-Free Excedrin  Drixoral-Non-Drowsy     Cold Compress  Mucinex (Guaifenasin)     Tylenol (Regular or Extra  Sudafed/Sudafed-12 Hour     Strength)  **Sudafed PE  Pseudoephedrine   Tylenol Cold & Sinus     Vicks Vapor Rub  Zyrtec  **AVOID if Problems With Blood Pressure         Heartburn: Avoid lying down for at least 1 hour after meals  Aciphex      Maalox     Rash:  Milk of Magnesia     Benadryl    Mylanta       1% Hydrocortisone Cream  Pepcid  Pepcid Complete   Sleep Aids:  Prevacid      Ambien   Prilosec       Benadryl  Rolaids       Chamomile Tea  Tums (Limit 4/day)     Unisom  Zantac       Tylenol PM         Warm milk-add vanilla or  Hemorrhoids:       Sugar for taste  Anusol/Anusol H.C.  (RX: Analapram 2.5%)  Sugar Substitutes:  Hydrocortisone OTC     Ok in moderation  Preparation H      Tucks        Vaseline lotion applied to tissue with wiping    Herpes:     Throat:  Acyclovir      Oragel  Famvir  Valtrex     Vaccines:         Flu Shot Leg Cramps:       *Gardasil  Benadryl      Hepatitis A         Hepatitis B Nasal Spray:       Pneumovax  Saline Nasal Spray     [place] Booster         Tetanus Nausea:       Tuberculosis test or PPD  Vitamin B6 25 mg TID   AVOID:    Dramamine      *Gardasil  Emetrol       Live Poliovirus  Ginger Root 250 mg QID    MMR (measles, mumps &  High Complex Carbs @ Bedtime    rebella)  Sea Bands-Accupressure    Varicella (Chickenpox)  Unisom 1/2 tab TID     *No known complications           If received before Pain:         Known pregnancy;   Darvocet       Resume series after  Lortab        Delivery  Percocet    Yeast:   Tramadol      Femstat  Tylenol 3      Gyne-lotrimin  Ultram       Monistat  Vicodin           MISC:         All Sunscreens           Hair Coloring/highlights          Insect Repellant's          (Including DEET)         [place] Tans Morning Sickness Morning sickness is when you feel sick to your stomach (nauseous) during pregnancy. You may feel sick to your stomach and throw up (vomit). You may feel sick in the morning, but you can feel this way any time of  day. Some women feel very sick to their stomach and cannot stop throwing up (hyperemesis gravidarum). Follow these instructions at home:  Only take medicines as told by your doctor.  Take multivitamins as told by your doctor. Taking multivitamins before getting pregnant can stop or lessen the harshness of morning sickness.  Eat dry toast or unsalted crackers before getting out of bed.  Eat 5 to 6 small meals a day.  Eat dry and bland foods like rice and baked potatoes.  Do not drink liquids with meals. Drink between meals.  Do not eat greasy, fatty, or spicy foods.  Have someone cook for you if the smell of food causes you to feel sick or throw up.  If you feel sick to your stomach after taking prenatal vitamins, take them at night or with a snack.  Eat protein when you need a snack (nuts, yogurt, cheese).  Eat unsweetened gelatins for dessert.  Wear a bracelet used for sea sickness (acupressure wristband).  Go to a doctor that puts thin needles into certain body points (acupuncture) to improve how you feel.  Do not smoke.  Use a humidifier to keep the air in your house free of odors.  Get lots of fresh air. Contact a doctor if:  You need medicine to feel better.  You feel dizzy or lightheaded.  You are losing weight. Get help right away if:  You feel very sick to your stomach and cannot stop throwing up.  You pass out (faint). This information is not intended to replace advice given to you by your health care provider. Make sure you discuss any questions you have with your health care provider. Document Released: 02/11/2004 Document Revised: 06/11/2015 Document Reviewed: 06/20/2012 Elsevier Interactive Patient Education  2017 [place] of Pregnancy The first trimester of pregnancy is from week 1 until the end of week 13 (months 1 through 3). A week after a sperm fertilizes an egg, the egg will implant on the wall of the uterus. This embryo will  begin to develop into a baby. Genes from you and your partner will form the baby. The female genes will determine whether the baby will be a boy or a girl. At 6-8 weeks,  the eyes and face will be formed, and the heartbeat can be seen on ultrasound. At the end of 12 weeks, all the baby's organs will be formed. Now that you are pregnant, you will want to do everything you can to have a healthy baby. Two of the most important things are to get good prenatal care and to follow your health care provider's instructions. Prenatal care is all the medical care you receive before the baby's birth. This care will help prevent, find, and treat any problems during the pregnancy and childbirth. Body changes during your first trimester Your body goes through many changes during pregnancy. The changes vary from woman to woman.  You may gain or lose a couple of pounds at first.  You may feel sick to your stomach (nauseous) and you may throw up (vomit). If the vomiting is uncontrollable, call your health care provider.  You may tire easily.  You may develop headaches that can be relieved by medicines. All medicines should be approved by your health care provider.  You may urinate more often. Painful urination may mean you have a bladder infection.  You may develop heartburn as a result of your pregnancy.  You may develop constipation because certain hormones are causing the muscles that push stool through your intestines to slow down.  You may develop hemorrhoids or swollen veins (varicose veins).  Your breasts may begin to grow larger and become tender. Your nipples may stick out more, and the tissue that surrounds them (areola) may become darker.  Your gums may bleed and may be sensitive to brushing and flossing.  Dark spots or blotches (chloasma, mask of pregnancy) may develop on your face. This will likely fade after the baby is born.  Your menstrual periods will stop.  You may have a loss of  appetite.  You may develop cravings for certain kinds of food.  You may have changes in your emotions from day to day, such as being excited to be pregnant or being concerned that something may go wrong with the pregnancy and baby.  You may have more vivid and strange dreams.  You may have changes in your hair. These can include thickening of your hair, rapid growth, and changes in texture. Some women also have hair loss during or after pregnancy, or hair that feels dry or thin. Your hair will most likely return to normal after your baby is born.  What to expect at prenatal visits During a routine prenatal visit:  You will be weighed to make sure you and the baby are growing normally.  Your blood pressure will be taken.  Your abdomen will be measured to track your baby's growth.  The fetal heartbeat will be listened to between weeks 10 and 14 of your pregnancy.  Test results from any previous visits will be discussed.  Your health care provider may ask you:  How you are feeling.  If you are feeling the baby move.  If you have had any abnormal symptoms, such as leaking fluid, bleeding, severe headaches, or abdominal cramping.  If you are using any tobacco products, including cigarettes, chewing tobacco, and electronic cigarettes.  If you have any questions.  Other tests that may be performed during your first trimester include:  Blood tests to find your blood type and to check for the presence of any previous infections. The tests will also be used to check for low iron levels (anemia) and protein on red blood cells (Rh antibodies). Depending on your  risk factors, or if you previously had diabetes during pregnancy, you may have tests to check for high blood sugar that affects pregnant women (gestational diabetes).  Urine tests to check for infections, diabetes, or protein in the urine.  An ultrasound to confirm the proper growth and development of the baby.  Fetal screens  for spinal cord problems (spina bifida) and Down syndrome.  HIV (human immunodeficiency virus) testing. Routine prenatal testing includes screening for HIV, unless you choose not to have this test.  You may need other tests to make sure you and the baby are doing well.  Follow these instructions at home: Medicines  Follow your health care provider's instructions regarding medicine use. Specific medicines may be either safe or unsafe to take during pregnancy.  Take a prenatal vitamin that contains at least 600 micrograms (mcg) of folic acid.  If you develop constipation, try taking a stool softener if your health care provider approves. Eating and drinking  Eat a balanced diet that includes fresh fruits and vegetables, whole grains, good sources of protein such as meat, eggs, or tofu, and low-fat dairy. Your health care provider will help you determine the amount of weight gain that is right for you.  Avoid raw meat and uncooked cheese. These carry germs that can cause birth defects in the baby.  Eating four or five small meals rather than three large meals a day may help relieve nausea and vomiting. If you start to feel nauseous, eating a few soda crackers can be helpful. Drinking liquids between meals, instead of during meals, also seems to help ease nausea and vomiting.  Limit foods that are high in fat and processed sugars, such as fried and sweet foods.  To prevent constipation: ? Eat foods that are high in fiber, such as fresh fruits and vegetables, whole grains, and beans. ? Drink enough fluid to keep your urine clear or pale yellow. Activity  Exercise only as directed by your health care provider. Most women can continue their usual exercise routine during pregnancy. Try to exercise for 30 minutes at least 5 days a week. Exercising will help you: ? Control your weight. ? Stay in shape. ? Be prepared for labor and delivery.  Experiencing pain or cramping in the lower abdomen  or lower back is a good sign that you should stop exercising. Check with your health care provider before continuing with normal exercises.  Try to avoid standing for long periods of time. Move your legs often if you must stand in one place for a long time.  Avoid heavy lifting.  Wear low-heeled shoes and practice good posture.  You may continue to have sex unless your health care provider tells you not to. Relieving pain and discomfort  Wear a good support bra to relieve breast tenderness.  Take warm sitz baths to soothe any pain or discomfort caused by hemorrhoids. Use hemorrhoid cream if your health care provider approves.  Rest with your legs elevated if you have leg cramps or low back pain.  If you develop varicose veins in your legs, wear support hose. Elevate your feet for 15 minutes, 3-4 times a day. Limit salt in your diet. Prenatal care  Schedule your prenatal visits by the twelfth week of pregnancy. They are usually scheduled monthly at first, then more often in the last 2 months before delivery.  Write down your questions. Take them to your prenatal visits.  Keep all your prenatal visits as told by your health care provider. This  is important. Safety  Wear your seat belt at all times when driving.  Make a list of emergency phone numbers, including numbers for family, friends, the hospital, and police and fire departments. General instructions  Ask your health care provider for a referral to a local prenatal education class. Begin classes no later than the beginning of month 6 of your pregnancy.  Ask for help if you have counseling or nutritional needs during pregnancy. Your health care provider can offer advice or refer you to specialists for help with various needs.  Do not use hot tubs, steam rooms, or saunas.  Do not douche or use tampons or scented sanitary pads.  Do not cross your legs for long periods of time.  Avoid cat litter boxes and soil used by cats.  These carry germs that can cause birth defects in the baby and possibly loss of the fetus by miscarriage or stillbirth.  Avoid all smoking, herbs, alcohol, and medicines not prescribed by your health care provider. Chemicals in these products affect the formation and growth of the baby.  Do not use any products that contain nicotine or tobacco, such as cigarettes and e-cigarettes. If you need help quitting, ask your health care provider. You may receive counseling support and other resources to help you quit.  Schedule a dentist appointment. At home, brush your teeth with a soft toothbrush and be gentle when you floss. Contact a health care provider if:  You have dizziness.  You have mild pelvic cramps, pelvic pressure, or nagging pain in the abdominal area.  You have persistent nausea, vomiting, or diarrhea.  You have a bad smelling vaginal discharge.  You have pain when you urinate.  You notice increased swelling in your face, hands, legs, or ankles.  You are exposed to fifth disease or chickenpox.  You are exposed to [place] measles (rubella) and have never had it. Get help right away if:  You have a fever.  You are leaking fluid from your vagina.  You have spotting or bleeding from your vagina.  You have severe abdominal cramping or pain.  You have rapid weight gain or loss.  You vomit blood or material that looks like coffee grounds.  You develop a severe headache.  You have shortness of breath.  You have any kind of trauma, such as from a fall or a car accident. Summary  The first trimester of pregnancy is from week 1 until the end of week 13 (months 1 through 3).  Your body goes through many changes during pregnancy. The changes vary from woman to woman.  You will have routine prenatal visits. During those visits, your health care provider will examine you, discuss any test results you may have, and talk with you about how you are feeling. This information is  not intended to replace advice given to you by your health care provider. Make sure you discuss any questions you have with your health care provider. Document Released: 12/28/2000 Document Revised: 12/16/2015 Document Reviewed: 12/16/2015 Elsevier Interactive Patient Education  2017 [employer].

## 2016-10-28 NOTE — Progress Notes (Signed)
Pt is here for a confirmation of pregnancy. States she had  4 positive home pregnancy tests. C/o cramping but no spotting. Denies nausea,vomiting or breast tenderness.

## 2016-10-30 LAB — BETA HCG QUANT (REF LAB): HCG QUANT: 170 m[IU]/mL

## 2016-10-30 LAB — PROGESTERONE: PROGESTERONE: 10.6 ng/mL

## 2016-11-01 ENCOUNTER — Encounter: Payer: Self-pay | Admitting: Certified Nurse Midwife

## 2016-11-01 ENCOUNTER — Other Ambulatory Visit: Payer: Self-pay | Admitting: Certified Nurse Midwife

## 2016-11-01 DIAGNOSIS — R109 Unspecified abdominal pain: Secondary | ICD-10-CM

## 2016-11-01 DIAGNOSIS — N926 Irregular menstruation, unspecified: Secondary | ICD-10-CM

## 2016-11-01 DIAGNOSIS — O26899 Other specified pregnancy related conditions, unspecified trimester: Secondary | ICD-10-CM

## 2016-11-02 ENCOUNTER — Other Ambulatory Visit: Payer: 59

## 2016-11-02 DIAGNOSIS — O26899 Other specified pregnancy related conditions, unspecified trimester: Secondary | ICD-10-CM

## 2016-11-02 DIAGNOSIS — R109 Unspecified abdominal pain: Secondary | ICD-10-CM

## 2016-11-02 DIAGNOSIS — N926 Irregular menstruation, unspecified: Secondary | ICD-10-CM

## 2016-11-03 ENCOUNTER — Other Ambulatory Visit: Payer: Self-pay | Admitting: Certified Nurse Midwife

## 2016-11-03 DIAGNOSIS — R109 Unspecified abdominal pain: Principal | ICD-10-CM

## 2016-11-03 DIAGNOSIS — O26899 Other specified pregnancy related conditions, unspecified trimester: Secondary | ICD-10-CM

## 2016-11-03 LAB — BETA HCG QUANT (REF LAB): HCG QUANT: 1202 m[IU]/mL

## 2016-11-03 LAB — PROGESTERONE: PROGESTERONE: 9.3 ng/mL

## 2016-11-03 MED ORDER — PROGESTERONE MICRONIZED 200 MG PO CAPS: ORAL_CAPSULE | ORAL | 3 refills | Status: DC

## 2016-11-03 NOTE — Telephone Encounter (Signed)
Please contact patient to schedule ultrasound tomorrow due to abdominal cramping in early pregnancy. Thanks, JML

## 2016-11-03 NOTE — Telephone Encounter (Signed)
Pt has scheduled an appt at 11:30 tomorrow for ultrasound.

## 2016-11-04 ENCOUNTER — Ambulatory Visit (INDEPENDENT_AMBULATORY_CARE_PROVIDER_SITE_OTHER): Payer: 59

## 2016-11-04 ENCOUNTER — Ambulatory Visit (INDEPENDENT_AMBULATORY_CARE_PROVIDER_SITE_OTHER): Payer: 59 | Admitting: Certified Nurse Midwife

## 2016-11-04 ENCOUNTER — Encounter: Payer: Self-pay | Admitting: Certified Nurse Midwife

## 2016-11-04 VITALS — BP 103/54 | HR 82 | Wt 161.5 lb

## 2016-11-04 DIAGNOSIS — O26899 Other specified pregnancy related conditions, unspecified trimester: Secondary | ICD-10-CM | POA: Diagnosis not present

## 2016-11-04 DIAGNOSIS — O9989 Other specified diseases and conditions complicating pregnancy, childbirth and the puerperium: Secondary | ICD-10-CM | POA: Diagnosis not present

## 2016-11-04 DIAGNOSIS — R109 Unspecified abdominal pain: Secondary | ICD-10-CM

## 2016-11-04 DIAGNOSIS — O99891 Other specified diseases and conditions complicating pregnancy: Secondary | ICD-10-CM

## 2016-11-04 DIAGNOSIS — M549 Dorsalgia, unspecified: Secondary | ICD-10-CM

## 2016-11-04 NOTE — Progress Notes (Signed)
Subjective:   Mary Parsons is a 26 y.o. G2P1001 2844w5d being seen today for problem obstetrical visit.  Patient reports backache and and lower abdominal cramping for the last week. No relief with home treatment measures.     Patient on supplemental vaginal progesterone for low progesterone level in early pregnancy.   Denies difficulty breathing or respiratory distress, chest pain, vaginal bleeding, dysuria, and leg pain or swelling.   The following portions of the patient's history were reviewed and updated as appropriate: allergies, current medications, past family history, past medical history, past social history, past surgical history and problem list.   Review of Systems:   ROS negative except as noted above. Information obtained from patient.   Objective:   BP (!) 103/54   Pulse 82   Wt 161 lb 8 oz (73.3 kg)   LMP 09/25/2016 (Exact Date)   BMI 29.54 kg/m    CONSTITUTIONAL: Well-developed, well-nourished female in no acute distress.   HENT:  Normocephalic, atraumatic.   NECK: Normal range of motion, supple, no masses.    SKIN: Skin is warm and dry. No rash noted. Not diaphoretic. No erythema. No pallor.  NEUROLGIC: Alert and oriented to person, place, and time.  PSYCHIATRIC: Normal mood and affect. Normal behavior. Normal judgment and thought content.  CARDIOVASCULAR:Not Examined  RESPIRATORY: Not Examined  BREASTS: Not Examined  ABDOMEN: Soft, non distended; Non tender.  No Organomegaly.  PELVIC: Not Examined  MUSCULOSKELETAL: Normal range of motion. No tenderness.  No cyanosis, clubbing, or edema.  ULTRASOUND REPORT  Location: [facility] Date of Service: 11/04/16  Indications: Cramping in early pregnancy Findings:  A possible small gestational sac is identified within the endometrium measuring 5 1/[redacted] weeks gestation (4.712mm) with an U/S EDD of 07/06/17. The (U/S) EDD is consistent with the clinically established (LMP) EDD of 07/02/17.   No  fetal pole was identified at this time.  Right Ovary measures 3.3 x 2.1 x 2.1 cm and appears WNL.  Left Ovary measures 2.4 x 1.1 x 1.5 cm and appears WNL.  There is evidence of a corpus luteal cyst in the right ovary. Survey of the adnexa demonstrates no adnexal masses. There is no free peritoneal fluid in the cul de sac.  Impression: 1. A possible small gestational sac measuring approx. 5 1/[redacted] weeks gestation (4.412mm) was identified. 2. (U/S) EDD is consistent with Clinically established (LMP) EDD of 07/02/17.  Recommendations: 1.Clinical correlation with the patient's History and Physical Exam. 2. F/U U/S recommended in 2-3 weeks to confirm the presence of a fetal pole and viability.  Urine dipstick shows negative for all components.    Assessment and Plan:   Pregnancy:  G2P1001 at 644w5d  1. Cramping affecting pregnancy, antepartum  - Beta HCG, Quant - Urine Culture  2. Back pain affecting pregnancy in first trimester  - Urine Culture  Plan:   Labs: Beta and urine culture, see orders. Will contact pt via MyChart with results.   Reviewed red flag symptoms and when to call.   Follow up in 3 weeks as previously scheduled or sooner if needed.   Gunnar BullaJenkins Michelle Naiya Corral, CNM

## 2016-11-04 NOTE — Patient Instructions (Signed)
First Trimester of Pregnancy The first trimester of pregnancy is from week 1 until the end of week 13 (months 1 through 3). A week after a sperm fertilizes an egg, the egg will implant on the wall of the uterus. This embryo will begin to develop into a baby. Genes from you and your partner will form the baby. The female genes will determine whether the baby will be a boy or a girl. At 6-8 weeks, the eyes and face will be formed, and the heartbeat can be seen on ultrasound. At the end of 12 weeks, all the baby's organs will be formed. Now that you are pregnant, you will want to do everything you can to have a healthy baby. Two of the most important things are to get good prenatal care and to follow your health care provider's instructions. Prenatal care is all the medical care you receive before the baby's birth. This care will help prevent, find, and treat any problems during the pregnancy and childbirth. Body changes during your first trimester Your body goes through many changes during pregnancy. The changes vary from woman to woman.  You may gain or lose a couple of pounds at first.  You may feel sick to your stomach (nauseous) and you may throw up (vomit). If the vomiting is uncontrollable, call your health care provider.  You may tire easily.  You may develop headaches that can be relieved by medicines. All medicines should be approved by your health care provider.  You may urinate more often. Painful urination may mean you have a bladder infection.  You may develop heartburn as a result of your pregnancy.  You may develop constipation because certain hormones are causing the muscles that push stool through your intestines to slow down.  You may develop hemorrhoids or swollen veins (varicose veins).  Your breasts may begin to grow larger and become tender. Your nipples may stick out more, and the tissue that surrounds them (areola) may become darker.  Your gums may bleed and may be  sensitive to brushing and flossing.  Dark spots or blotches (chloasma, mask of pregnancy) may develop on your face. This will likely fade after the baby is born.  Your menstrual periods will stop.  You may have a loss of appetite.  You may develop cravings for certain kinds of food.  You may have changes in your emotions from day to day, such as being excited to be pregnant or being concerned that something may go wrong with the pregnancy and baby.  You may have more vivid and strange dreams.  You may have changes in your hair. These can include thickening of your hair, rapid growth, and changes in texture. Some women also have hair loss during or after pregnancy, or hair that feels dry or thin. Your hair will most likely return to normal after your baby is born.  What to expect at prenatal visits During a routine prenatal visit:  You will be weighed to make sure you and the baby are growing normally.  Your blood pressure will be taken.  Your abdomen will be measured to track your baby's growth.  The fetal heartbeat will be listened to between weeks 10 and 14 of your pregnancy.  Test results from any previous visits will be discussed.  Your health care provider may ask you:  How you are feeling.  If you are feeling the baby move.  If you have had any abnormal symptoms, such as leaking fluid, bleeding, severe headaches,   or abdominal cramping.  If you are using any tobacco products, including cigarettes, chewing tobacco, and electronic cigarettes.  If you have any questions.  Other tests that may be performed during your first trimester include:  Blood tests to find your blood type and to check for the presence of any previous infections. The tests will also be used to check for low iron levels (anemia) and protein on red blood cells (Rh antibodies). Depending on your risk factors, or if you previously had diabetes during pregnancy, you may have tests to check for high blood  sugar that affects pregnant women (gestational diabetes).  Urine tests to check for infections, diabetes, or protein in the urine.  An ultrasound to confirm the proper growth and development of the baby.  Fetal screens for spinal cord problems (spina bifida) and Down syndrome.  HIV (human immunodeficiency virus) testing. Routine prenatal testing includes screening for HIV, unless you choose not to have this test.  You may need other tests to make sure you and the baby are doing well.  Follow these instructions at home: Medicines  Follow your health care provider's instructions regarding medicine use. Specific medicines may be either safe or unsafe to take during pregnancy.  Take a prenatal vitamin that contains at least 600 micrograms (mcg) of folic acid.  If you develop constipation, try taking a stool softener if your health care provider approves. Eating and drinking  Eat a balanced diet that includes fresh fruits and vegetables, whole grains, good sources of protein such as meat, eggs, or tofu, and low-fat dairy. Your health care provider will help you determine the amount of weight gain that is right for you.  Avoid raw meat and uncooked cheese. These carry germs that can cause birth defects in the baby.  Eating four or five small meals rather than three large meals a day may help relieve nausea and vomiting. If you start to feel nauseous, eating a few soda crackers can be helpful. Drinking liquids between meals, instead of during meals, also seems to help ease nausea and vomiting.  Limit foods that are high in fat and processed sugars, such as fried and sweet foods.  To prevent constipation: ? Eat foods that are high in fiber, such as fresh fruits and vegetables, whole grains, and beans. ? Drink enough fluid to keep your urine clear or pale yellow. Activity  Exercise only as directed by your health care provider. Most women can continue their usual exercise routine during  pregnancy. Try to exercise for 30 minutes at least 5 days a week. Exercising will help you: ? Control your weight. ? Stay in shape. ? Be prepared for labor and delivery.  Experiencing pain or cramping in the lower abdomen or lower back is a good sign that you should stop exercising. Check with your health care provider before continuing with normal exercises.  Try to avoid standing for long periods of time. Move your legs often if you must stand in one place for a long time.  Avoid heavy lifting.  Wear low-heeled shoes and practice good posture.  You may continue to have sex unless your health care provider tells you not to. Relieving pain and discomfort  Wear a good support bra to relieve breast tenderness.  Take warm sitz baths to soothe any pain or discomfort caused by hemorrhoids. Use hemorrhoid cream if your health care provider approves.  Rest with your legs elevated if you have leg cramps or low back pain.  If you develop   varicose veins in your legs, wear support hose. Elevate your feet for 15 minutes, 3-4 times a day. Limit salt in your diet. Prenatal care  Schedule your prenatal visits by the twelfth week of pregnancy. They are usually scheduled monthly at first, then more often in the last 2 months before delivery.  Write down your questions. Take them to your prenatal visits.  Keep all your prenatal visits as told by your health care provider. This is important. Safety  Wear your seat belt at all times when driving.  Make a list of emergency phone numbers, including numbers for family, friends, the hospital, and police and fire departments. General instructions  Ask your health care provider for a referral to a local prenatal education class. Begin classes no later than the beginning of month 6 of your pregnancy.  Ask for help if you have counseling or nutritional needs during pregnancy. Your health care provider can offer advice or refer you to specialists for help  with various needs.  Do not use hot tubs, steam rooms, or saunas.  Do not douche or use tampons or scented sanitary pads.  Do not cross your legs for long periods of time.  Avoid cat litter boxes and soil used by cats. These carry germs that can cause birth defects in the baby and possibly loss of the fetus by miscarriage or stillbirth.  Avoid all smoking, herbs, alcohol, and medicines not prescribed by your health care provider. Chemicals in these products affect the formation and growth of the baby.  Do not use any products that contain nicotine or tobacco, such as cigarettes and e-cigarettes. If you need help quitting, ask your health care provider. You may receive counseling support and other resources to help you quit.  Schedule a dentist appointment. At home, brush your teeth with a soft toothbrush and be gentle when you floss. Contact a health care provider if:  You have dizziness.  You have mild pelvic cramps, pelvic pressure, or nagging pain in the abdominal area.  You have persistent nausea, vomiting, or diarrhea.  You have a bad smelling vaginal discharge.  You have pain when you urinate.  You notice increased swelling in your face, hands, legs, or ankles.  You are exposed to fifth disease or chickenpox.  You are exposed to [place] measles (rubella) and have never had it. Get help right away if:  You have a fever.  You are leaking fluid from your vagina.  You have spotting or bleeding from your vagina.  You have severe abdominal cramping or pain.  You have rapid weight gain or loss.  You vomit blood or material that looks like coffee grounds.  You develop a severe headache.  You have shortness of breath.  You have any kind of trauma, such as from a fall or a car accident. Summary  The first trimester of pregnancy is from week 1 until the end of week 13 (months 1 through 3).  Your body goes through many changes during pregnancy. The changes vary from  woman to woman.  You will have routine prenatal visits. During those visits, your health care provider will examine you, discuss any test results you may have, and talk with you about how you are feeling. This information is not intended to replace advice given to you by your health care provider. Make sure you discuss any questions you have with your health care provider.   Back Pain in Pregnancy Back pain during pregnancy is common. Back pain may be  caused by several factors that are related to changes during your pregnancy. Follow these instructions at home: Managing pain, stiffness, and swelling  If directed, apply ice for sudden (acute) back pain. ? Put ice in a plastic bag. ? Place a towel between your skin and the bag. ? Leave the ice on for 20 minutes, 2-3 times per day.  If directed, apply heat to the affected area before you exercise: ? Place a towel between your skin and the heat pack or heating pad. ? Leave the heat on for 20-30 minutes. ? Remove the heat if your skin turns bright red. This is especially important if you are unable to feel pain, heat, or cold. You may have a greater risk of getting burned. Activity  Exercise as told by your health care provider. Exercising is the best way to prevent or manage back pain.  Listen to your body when lifting. If lifting hurts, ask for help or bend your knees. This uses your leg muscles instead of your back muscles.  Squat down when picking up something from the floor. Do not bend over.  Only use bed rest as told by your health care provider. Bed rest should only be used for the most severe episodes of back pain. Standing, Sitting, and Lying Down  Do not stand in one place for long periods of time.  Use good posture when sitting. Make sure your head rests over your shoulders and is not hanging forward. Use a pillow on your lower back if necessary.  Try sleeping on your side, preferably the left side, with a pillow or two  between your legs. If you are sore after a night's rest, your bed may be too soft. A firm mattress may provide more support for your back during pregnancy. General instructions  Do not wear high heels.  Eat a healthy diet. Try to gain weight within your health care provider's recommendations.  Use a maternity girdle, elastic sling, or back brace as told by your health care provider.  Take over-the-counter and prescription medicines only as told by your health care provider.  Keep all follow-up visits as told by your health care provider. This is important. This includes any visits with any specialists, such as a physical therapist. Contact a health care provider if:  Your back pain interferes with your daily activities.  You have increasing pain in other parts of your body. Get help right away if:  You develop numbness, tingling, weakness, or problems with the use of your arms or legs.  You develop severe back pain that is not controlled with medicine.  You have a sudden change in bowel or bladder control.  You develop shortness of breath, dizziness, or you faint.  You develop nausea, vomiting, or sweating.  You have back pain that is a rhythmic, cramping pain similar to labor pains. Labor pain is usually 1-2 minutes apart, lasts for about 1 minute, and involves a bearing down feeling or pressure in your pelvis.  You have back pain and your water breaks or you have vaginal bleeding.  You have back pain or numbness that travels down your leg.  Your back pain developed after you fell.  You develop pain on one side of your back.  You see blood in your urine.  You develop skin blisters in the area of your back pain. This information is not intended to replace advice given to you by your health care provider. Make sure you discuss any questions you have with  your health care provider. Document Released: 04/13/2005 Document Revised: 06/11/2015 Document Reviewed:  09/17/2014 Elsevier Interactive Patient Education  2018 [employer]  Document Released: 12/28/2000 Document Revised: 12/16/2015 Document Reviewed: 12/16/2015 Elsevier Interactive Patient Education  2017 [employer].

## 2016-11-05 LAB — BETA HCG QUANT (REF LAB): hCG Quant: 2012 m[IU]/mL

## 2016-11-06 LAB — URINE CULTURE

## 2016-11-07 ENCOUNTER — Encounter: Payer: Self-pay | Admitting: Certified Nurse Midwife

## 2016-11-15 ENCOUNTER — Ambulatory Visit (INDEPENDENT_AMBULATORY_CARE_PROVIDER_SITE_OTHER): Payer: 59

## 2016-11-15 ENCOUNTER — Encounter: Payer: Self-pay | Admitting: Certified Nurse Midwife

## 2016-11-15 ENCOUNTER — Ambulatory Visit (INDEPENDENT_AMBULATORY_CARE_PROVIDER_SITE_OTHER): Payer: 59 | Admitting: Certified Nurse Midwife

## 2016-11-15 ENCOUNTER — Telehealth: Payer: Self-pay | Admitting: Certified Nurse Midwife

## 2016-11-15 DIAGNOSIS — O26891 Other specified pregnancy related conditions, first trimester: Secondary | ICD-10-CM | POA: Diagnosis not present

## 2016-11-15 DIAGNOSIS — R109 Unspecified abdominal pain: Principal | ICD-10-CM

## 2016-11-15 DIAGNOSIS — O200 Threatened abortion: Secondary | ICD-10-CM | POA: Diagnosis not present

## 2016-11-15 NOTE — Telephone Encounter (Signed)
Pt called

## 2016-11-15 NOTE — Patient Instructions (Signed)
FACTS YOU SHOULD KNOW  About Early Pregnancy Loss  WHAT IS AN EARLY PREGNANCY LOSS? Once the egg is fertilized with the sperm and begins to develop, it attaches to the lining of the uterus. This early pregnancy tissue may not develop into an embryo (the beginning stage of a baby). Sometimes an embryo does develop but does not continue to grow. These problems can be seen on ultrasound.   MANAGEMNT OF EARLY PREGNANCY LOSS: About 4 out of 100 (0.25%) women will have a pregnancy loss in her lifetime.  One in five pregnancies is found to be an early pregnancy loss.  There are 3 ways to care for an early pregnancy loss:   (1) Surgery, (2) Medicine, (3) Waiting for you to pass the pregnancy on your own. The decision as to how to proceed after being diagnosed with and early pregnancy loss is an individual one.  The decision can be made only after appropriate counseling.  You need to weigh the pros and cons of the 3 choices. Then you can make the choice that works for you.  SURGERY (D&E) . Procedure over in 1 day . Requires being put to sleep . Bleeding may be light . Possible problems during surgery, including injury to womb(uterus) . Care provider has more control Medicine (CYTOTEC) . The complete procedure may take days to weeks . No Surgery . Bleeding may be heavy at times . There may be drug side effects . Patient has more control Waiting . You may choose to wait, in which case your own body may complete the passing of the abnormal early pregnancy on its own in about 2-4 weeks . Your bleeding may be heavy at times . There is a small possibility that you may need surgery if the bleeding is too much or not all of the pregnancy has passed.  CYTOTEC MANAGEMENT Prostaglandins (cytotec) are the most widely used drug for this purpose. They cause the uterus to cramp and contract. You will place the medicine yourself inside your vagina in the privacy of your home. Empting of the uterus should occur  within 3 days but the process may continue for several weeks. The bleeding may seem heavy at times.  INSTRUCTIONS: Take all 4 tablets of cytotec (800mcg total) at one time. This will cause a lot of cramping, you may have bleeding, and pass tissue, then the cramping and bleeding should get better. If you do not pass the tissue, then you can take 4 more tablets of cytotec (800mcg total) 48 hours after your first dose.  You will come back to have your blood drawn to make sure the pregnancy hormones are dropping in 1 week. Please call us if you have any questions.   POSSIBLE SIDE EFFECTS FROM CYTOTEC . Nausea  Vomiting . Diarrhea Fever . Chills  Hot Flashes Side effects  from the process of the early pregnancy loss include: . Cramping  Bleeding . Headaches  Dizziness RISKS: This is a low risk procedure. Less than 1 in 100 women has a complication. An incomplete passage of the early pregnancy may occur. Also, hemorrhage (heavy bleeding) could happen.  Rarely the pregnancy will not be passed completely. Excessively heavy bleeding may occur.  Your doctor may need to perform surgery to empty the uterus (D&E). Afterwards: Everybody will feel differently after the early pregnancy loss completion. You may have soreness or cramps for a day or two. You may have soreness or cramps for day or two.  You may have light   bleeding for up to 2 weeks. You may be as active as you feel like being. If you have any of the following problems you may call [facility] at [phone] or [facility] at [phone] if it is after hours. . If you have pain that does not get better with pain medication . Bleeding that soaks through 2 thick full-sized sanitary pads in an hour . Cramps that last longer than 2 days . Foul smelling discharge . Fever above 100.4 degrees F Even if you do not have any of these symptoms, you should have a follow-up exam to make sure you are healing properly. Your next normal period will  usually start again in 4-6 week after the loss. You can get pregnant soon after the loss, so use birth control right away. Finally: Make sure all your questions are answered before during and after any procedure. Follow up with medical care and family planning methods.      

## 2016-11-15 NOTE — Telephone Encounter (Signed)
Patient called and stated that she is experiencing severe cramping and was informed to call the office to speak with Marcelino DusterMichelle or a nurse to decide if she needs to be seen and come into th office, The patient also disclosed that she is having lower back pain. The patient is 7 wks. No other information was disclosed. Please advise.

## 2016-11-15 NOTE — Telephone Encounter (Signed)
Past 2 days cramping worse and back pain. Yellowish (almost like an egg) discharge after progesterone tab insertion. No vaginal bleeding. Pt offered an ultrasound and BHCG today. Will have ultrasound at 11:30 and see midwife after.

## 2016-11-15 NOTE — Progress Notes (Signed)
Subjective:    Mary Parsons is a 26 y.o. female. Mary Parsons reports cramping that has increaed in intensity  over the past week. She is not in acute distress. Ectopic risks: none.  Ultrasound today shows No fetal heart tones, probable fetal pole with a CRL consistent with [redacted] weeks gestation which  IS NOT consistent with the clinically established (LMP) EDD of 07/02/17. See below for full report.   The following portions of the patient's history were reviewed and updated as appropriate: allergies, current medications, past family history, past medical history, past social history, past surgical history and problem list.  Review of Systems Pertinent items are noted in HPI.   Objective:     LMP 09/25/2016 (Exact Date)  General:   alert, cooperative, appears stated age, mild distress and tearful   Heart: regular rate and rhythm  Lungs: pink , no signs of distress  Abdomen:   Pelvic:                  Vulva:               Vagina:                 Cervix:                Uterus:               Adnexa:    Imaging Limited office ultrasound:    ULTRASOUND REPORT  Location: [facility] Date of Service:  11/15/16  Indications:Viability for severe abdomen pain Findings:  PROBABLE NON-VIABLE Singleton intrauterine pregnancy is visualized with a probable fetal pole with a CRL consistent with [redacted] weeks gestation, giving an (U/S) EDD of 07/11/17. The (U/S) EDD IS NOT consistent with the clinically established (LMP) EDD of 07/02/17.  A 5 1/[redacted] weeks gestational sac was noted on U/S from 11/04/16.  The gestational sac measures 5 6/7 weeks on today's exam.  FHR: No fetal heart tones CRL measurement: 4.6 mm Yolk sac appears prominent at 5.1 mm.  Right Ovary measures 2.3 x 2.1 x 1.8 cm and appears WNL.  Left Ovary measures 1.6 x 1.4 x 1.4 cm and appears WNL.  There is evidence of a corpus luteal cyst in the right ovary. Survey of the adnexa demonstrates no adnexal masses. There is no free  peritoneal fluid in the cul de sac.  Impression: 1. 6 week probable non-viable Singleton Intrauterine pregnancy by U/S. 2. (U/S) EDD IS NOT consistent with Clinically established (LMP) EDD of 07/02/17.  Recommendations: 1.Clinical correlation with the patient's History and Physical Exam.  Mary Parsons, RDMS   Assessment:     Missed abortion  Plan:    Reviewed options of surgery, watchful waiting, or medication. She is taking time to process and will reach out to me when she has decided how she would like to proceed.   Mary Parsons, CNM

## 2016-11-16 ENCOUNTER — Encounter: Payer: Self-pay | Admitting: Obstetrics and Gynecology

## 2016-11-16 ENCOUNTER — Ambulatory Visit (INDEPENDENT_AMBULATORY_CARE_PROVIDER_SITE_OTHER): Payer: 59 | Admitting: Obstetrics and Gynecology

## 2016-11-16 VITALS — BP 113/68 | HR 81 | Ht 62.0 in | Wt 163.0 lb

## 2016-11-16 DIAGNOSIS — O021 Missed abortion: Secondary | ICD-10-CM

## 2016-11-16 DIAGNOSIS — N941 Unspecified dyspareunia: Secondary | ICD-10-CM | POA: Diagnosis not present

## 2016-11-16 DIAGNOSIS — R102 Pelvic and perineal pain: Secondary | ICD-10-CM | POA: Diagnosis not present

## 2016-11-16 DIAGNOSIS — Z842 Family history of other diseases of the genitourinary system: Secondary | ICD-10-CM | POA: Diagnosis not present

## 2016-11-16 NOTE — Patient Instructions (Addendum)
Diagnostic Laparoscopy A diagnostic laparoscopy is a procedure to diagnose diseases in the abdomen. During the procedure, a thin, lighted, pencil-sized instrument called a laparoscope is inserted into the abdomen through an incision. The laparoscope allows your health care provider to look at the organs inside your body. Tell a health care provider about:  Any allergies you have.  All medicines you are taking, including vitamins, herbs, eye drops, creams, and over-the-counter medicines.  Any problems you or family members have had with anesthetic medicines.  Any blood disorders you have.  Any surgeries you have had.  Any medical conditions you have. What are the risks? Generally, this is a safe procedure. However, problems can occur, which may include:  Infection.  Bleeding.  Damage to other organs.  Allergic reaction to the anesthetics used during the procedure.  What happens before the procedure?  Do not eat or drink anything after midnight on the night before the procedure or as directed by your health care provider.  Ask your health care provider about: ? Changing or stopping your regular medicines. ? Taking medicines such as aspirin and ibuprofen. These medicines can thin your blood. Do not take these medicines before your procedure if your health care provider instructs you not to.  Plan to have someone take you home after the procedure. What happens during the procedure?  You may be given a medicine to help you relax (sedative).  You will be given a medicine to make you sleep (general anesthetic).  Your abdomen will be inflated with a gas. This will make your organs easier to see.  Small incisions will be made in your abdomen.  A laparoscope and other small instruments will be inserted into the abdomen through the incisions.  A tissue sample may be removed from an organ in the abdomen for examination.  The instruments will be removed from the abdomen.  The  gas will be released.  The incisions will be closed with stitches (sutures). What happens after the procedure? Your blood pressure, heart rate, breathing rate, and blood oxygen level will be monitored often until the medicines you were given have worn off. This information is not intended to replace advice given to you by your health care provider. Make sure you discuss any questions you have with your health care provider. Document Released: 04/11/2000 Document Revised: 05/14/2015 Document Reviewed: 08/16/2013 Elsevier Interactive Patient Education  2018 [employer]  Dilation and Curettage or Vacuum Curettage Dilation and curettage (D&C) and vacuum curettage are minor procedures. A D&C involves stretching (dilation) the cervix and scraping (curettage) the inside lining of the uterus (endometrium). During a D&C, tissue is gently scraped from the endometrium, starting from the top portion of the uterus down to the lowest part of the uterus (cervix). During a vacuum curettage, the lining and tissue in the uterus are removed with the use of gentle suction. Curettage may be performed to either diagnose or treat a problem. As a diagnostic procedure, curettage is performed to examine tissues from the uterus. A diagnostic curettage may be done if you have:  Irregular bleeding in the uterus.  Bleeding with the development of clots.  Spotting between menstrual periods.  Prolonged menstrual periods or other abnormal bleeding.  Bleeding after menopause.  No menstrual period (amenorrhea).  A change in size and shape of the uterus.  Abnormal endometrial cells discovered during a Pap test.  As a treatment procedure, curettage may be performed for the following reasons:  Removal of an IUD (intrauterine device).  Removal of retained placenta after giving birth.  Abortion.  Miscarriage.  Removal of endometrial polyps.  Removal of uncommon types of noncancerous lumps (fibroids).  Tell a  health care provider about:  Any allergies you have, including allergies to prescribed medicine or latex.  All medicines you are taking, including vitamins, herbs, eye drops, creams, and over-the-counter medicines. This is especially important if you take any blood-thinning medicine. Bring a list of all of your medicines to your appointment.  Any problems you or family members have had with anesthetic medicines.  Any blood disorders you have.  Any surgeries you have had.  Your medical history and any medical conditions you have.  Whether you are pregnant or may be pregnant.  Recent vaginal infections you have had.  Recent menstrual periods, bleeding problems you have had, and what form of birth control (contraception) you use. What are the risks? Generally, this is a safe procedure. However, problems may occur, including:  Infection.  Heavy vaginal bleeding.  Allergic reactions to medicines.  Damage to the cervix or other structures or organs.  Development of scar tissue (adhesions) inside the uterus, which can cause abnormal amounts of menstrual bleeding. This may make it harder to get pregnant in the future.  A hole (perforation) or puncture in the uterine wall. This is rare.  What happens before the procedure? Staying hydrated Follow instructions from your health care provider about hydration, which may include:  Up to 2 hours before the procedure - you may continue to drink clear liquids, such as water, clear fruit juice, black coffee, and plain tea.  Eating and drinking restrictions Follow instructions from your health care provider about eating and drinking, which may include:  8 hours before the procedure - stop eating heavy meals or foods such as meat, fried foods, or fatty foods.  6 hours before the procedure - stop eating light meals or foods, such as toast or cereal.  6 hours before the procedure - stop drinking milk or drinks that contain milk.  2 hours  before the procedure - stop drinking clear liquids. If your health care provider told you to take your medicine(s) on the day of your procedure, take them with only a sip of water.  Medicines  Ask your health care provider about: ? Changing or stopping your regular medicines. This is especially important if you are taking diabetes medicines or blood thinners. ? Taking medicines such as aspirin and ibuprofen. These medicines can thin your blood. Do not take these medicines before your procedure if your health care provider instructs you not to.  You may be given antibiotic medicine to help prevent infection. General instructions  For 24 hours before your procedure, do not: ? Douche. ? Use tampons. ? Use medicines, creams, or suppositories in the vagina. ? Have sexual intercourse.  You may be given a pregnancy test on the day of the procedure.  Plan to have someone take you home from the hospital or clinic.  You may have a blood or urine sample taken.  If you will be going home right after the procedure, plan to have someone with you for 24 hours. What happens during the procedure?  To reduce your risk of infection: ? Your health care team will wash or sanitize their hands. ? Your skin will be washed with soap.  An IV tube will be inserted into one of your veins.  You will be given one of the following: ? A medicine that numbs the area in  and around the cervix (local anesthetic). ? A medicine to make you fall asleep (general anesthetic).  You will lie down on your back, with your feet in foot rests (stirrups).  The size and position of your uterus will be checked.  A lubricated instrument (speculum or Sims retractor) will be inserted into the back side of your vagina. The speculum will be used to hold apart the walls of your vagina so your health care provider can see your cervix.  A tool (tenaculum) will be attached to the lip of the cervix to stabilize it.  Your cervix  will be softened and dilated. This may be done by: ? Taking a medicine. ? Having tapered dilators or thin rods (laminaria) or gradual widening instruments (tapered dilators) inserted into your cervix.  A small, sharp, curved instrument (curette) will be used to scrape a small amount of tissue or cells from the endometrium or cervical canal. In some cases, gentle suction is applied with the curette. The curette will then be removed. The cells will be taken to a lab for testing. The procedure may vary among health care providers and hospitals. What happens after the procedure?  You may have mild cramping, backache, pain, and light bleeding or spotting. You may pass small blood clots from your vagina.  You may have to wear compression stockings. These stockings help to prevent blood clots and reduce swelling in your legs.  Your blood pressure, heart rate, breathing rate, and blood oxygen level will be monitored until the medicines you were given have worn off. Summary  Dilation and curettage (D&C) involves stretching (dilation) the cervix and scraping (curettage) the inside lining of the uterus (endometrium).  After the procedure, you may have mild cramping, backache, pain, and light bleeding or spotting. You may pass small blood clots from your vagina.  Plan to have someone take you home from the hospital or clinic. This information is not intended to replace advice given to you by your health care provider. Make sure you discuss any questions you have with your health care provider. Document Released: 01/03/2005 Document Revised: 09/20/2015 Document Reviewed: 09/20/2015 Elsevier Interactive Patient Education  2018 [employer].

## 2016-11-16 NOTE — Progress Notes (Signed)
GYNECOLOGY PROGRESS NOTE  Subjective:    Patient ID: Mary Parsons, female    DOB: 09-Jan-1991, [age] y.o.   MRN: [id number]  HPI  Patient is a 26 y.o. 202P1001 female who presents for discussion of surgical management of missed abortion.  Patient was seen by midwife Doreene BurkeAnnie Thompson, CNM on yesterday after ultrasound done for worsening pelvic cramping and back pain. Denied vaginal bleeding. Ultrasound noted a non-viable pregnancy at [redacted] weeks gestation. Patient was offered expectant management, medical management (Cytotec) and surgical management with D&C.  Patient opted for surgical management.   Of note, patient also inquires if she could be evaluated for endometriosis.  She has a history of pelvic pain, dyspareunia, and heavy menses.  Notes that after her last pregnancy last year she desired to undergo evaluation but her provider at that time recommended trying hormonal suppression first. Had used a Mirena IUD for contraception and management of symptoms but notes that she begain having irregular cycles.  She switched from IUD to OCPs and had been on hese for ~ 4 months before discovery of pregnancy. Patient also sites a strong family history of endometriosis.   The following portions of the patient's history were reviewed and updated as appropriate: allergies, current medications, past family history, past medical history, past social history, past surgical history and problem list.  Review of Systems Pertinent items noted in HPI and remainder of comprehensive ROS otherwise negative.   Objective:   Blood pressure 113/68, pulse 81, height 5\' 2"  (1.575 m), weight 163 lb (73.9 kg), last menstrual period 09/25/2016, not currently breastfeeding. General appearance: alert and no distress Abdomen: soft, non-tender; bowel sounds normal; no masses,  no organomegaly Pelvic: deferred   Imaging:  ULTRASOUND REPORT  Location: [facility] Date of Service:   11/15/16  Indications:Viability for severe abdomen pain Findings:  PROBABLE NON-VIABLE Singleton intrauterine pregnancy is visualized with a probable fetal pole with a CRL consistent with [redacted] weeks gestation, giving an (U/S) EDD of 07/11/17. The (U/S) EDD IS NOT consistent with the clinically established (LMP) EDD of 07/02/17.  A 5 1/[redacted] weeks gestational sac was noted on U/S from 11/04/16.  The gestational sac measures 5 6/7 weeks on today's exam.  FHR: No fetal heart tones CRL measurement: 4.6 mm Yolk sac appears prominent at 5.1 mm.  Right Ovary measures 2.3 x 2.1 x 1.8 cm and appears WNL.  Left Ovary measures 1.6 x 1.4 x 1.4 cm and appears WNL.  There is evidence of a corpus luteal cyst in the right ovary. Survey of the adnexa demonstrates no adnexal masses. There is no free peritoneal fluid in the cul de sac.  Impression: 1. 6 week probable non-viable Singleton Intrauterine pregnancy by U/S. 2. (U/S) EDD IS NOT consistent with Clinically established (LMP) EDD of 07/02/17.  Recommendations: 1.Clinical correlation with the patient's History and Physical Exam.   Kari BaarsJill Long, RDMS  Assessment:   Missed abortion at 6 weeks Pelvic pain Dyspareunia Heavy menses Plan:   Patient desires surgical management with Suction D&C.  She also desires surgical evaluation for endometriosis.  The risks of surgery were discussed in detail with the patient including but not limited to: bleeding which may require transfusion or reoperation; infection which may require prolonged hospitalization or re-hospitalization and antibiotic therapy; injury to bowel, bladder, ureters and major vessels or other surrounding organs; need for additional procedures including laparotomy; thromboembolic phenomenon, incisional problems and other postoperative or anesthesia complications.  Patient was told that the likelihood that  her condition and symptoms will be treated effectively with this surgical management was  very high; the postoperative expectations were also discussed in detail. The patient also understands the alternative treatment options which were discussed in full. All questions were answered.  She was told that she will be contacted by our surgical scheduler regarding the time and date of her surgery; routine preoperative instructions of having nothing to eat or drink after midnight on the day prior to surgery and also coming to the hospital 1.5 hours prior to her time of surgery were also emphasized.  Printed patient education handouts about the procedure were given to the patient to review at home.  Patient scheduled for Suction D&C, and laparoscopy with biopsies on 11/18/2016.   A total of 15 minutes were spent face-to-face with the patient during this encounter and over half of that time dealt with counseling and coordination of care.   Hildred Laser, MD [facility]

## 2016-11-17 ENCOUNTER — Encounter: Payer: Self-pay | Admitting: Certified Nurse Midwife

## 2016-11-17 MED ORDER — DOXYCYCLINE HYCLATE 100 MG IV SOLR
200.0000 mg | INTRAVENOUS | Status: AC
  Administered 2016-11-18: 200 mg via INTRAVENOUS
  Filled 2016-11-17: qty 200

## 2016-11-17 NOTE — H&P (Signed)
GYNECOLOGY PREOPERATIVE HISTORY AND PHYSICAL   Subjective:  Mary Parsons is a 26 y.o. G2P1001 here for surgical management of missed abortion at 7 weeks.  Patient also with a history of heavy menses, pelvic pain and dyspareunia, symptoms suspicious for endometriosis, as well as a family history of endometriosis.  No significant preoperative concerns.  Proposed surgery: Laparoscopy with biopsies, Suction D&C   Pertinent Gynecological History: Menses: regular every month without intermenstrual spotting.  Patient's last menstrual period was 09/25/2016 (exact date).  Last pap: normal Date: ~ 2-3 years ago.    Past Medical History:  Diagnosis Date  . Heavy periods     Past Surgical History:  Procedure Laterality Date  . CESAREAN SECTION    . TONSILLECTOMY      OB History  Gravida Para Term Preterm AB Living  2 1 1     1   SAB TAB Ectopic Multiple Live Births          1    # Outcome Date GA Lbr Len/2nd Weight Sex Delivery Anes PTL Lv  2 Current           1 Term 08/21/15 530w5d  4 lb 2.2 oz (1.878 kg) M CS-Unspec   LIV     Complications: Fetal Intolerance      Family History  Problem Relation Age of Onset  . Endometriosis Mother   . Endometriosis Maternal Aunt   . Endometriosis Maternal Grandmother    Social History   Social History  . Marital status: Married    Spouse name: N/A  . Number of children: N/A  . Years of education: N/A   Occupational History  . Not on file.   Social History Main Topics  . Smoking status: Never Smoker  . Smokeless tobacco: Never Used  . Alcohol use Yes     Comment: occas  . Drug use: No  . Sexual activity: Yes    Birth control/ protection: Pill, None   Other Topics Concern  . Not on file   Social History Narrative  . No narrative on file   Current Outpatient Prescriptions on File Prior to Visit  Medication Sig Dispense Refill  . progesterone (PROMETRIUM) 200 MG capsule Place one capsule vaginally at bedtime (Patient  not taking: Reported on 11/16/2016) 30 capsule 3   No current facility-administered medications on file prior to visit.    Allergies  Allergen Reactions  . Adhesive [Tape] Other (See Comments)    Burns skin off--tolerates PAPER TAPE ONLY      Review of Systems Constitutional: No recent fever/chills/sweats Respiratory: No recent cough/bronchitis Cardiovascular: No chest pain Gastrointestinal: No recent nausea/vomiting/diarrhea Genitourinary: No UTI symptoms Hematologic/lymphatic:No history of coagulopathy or recent blood thinner use    Objective:   Blood pressure 113/68, pulse 81, height 5\' 2"  (1.575 m), weight 163 lb (73.9 kg), last menstrual period 09/25/2016, not currently breastfeeding. CONSTITUTIONAL: Well-developed, well-nourished female in no acute distress.  HENT:  Normocephalic, atraumatic, External right and left ear normal. Oropharynx is clear and moist EYES: Conjunctivae and EOM are normal. Pupils are equal, round, and reactive to light. No scleral icterus.  NECK: Normal range of motion, supple, no masses SKIN: Skin is warm and dry. No rash noted. Not diaphoretic. No erythema. No pallor. NEUROLOGIC: Alert and oriented to person, place, and time. Normal reflexes, muscle tone coordination. No cranial nerve deficit noted. PSYCHIATRIC: Normal mood and affect. Normal behavior. Normal judgment and thought content. CARDIOVASCULAR: Normal heart rate noted, regular rhythm RESPIRATORY: Effort  and breath sounds normal, no problems with respiration noted ABDOMEN: Soft, nontender, nondistended. PELVIC: Deferred MUSCULOSKELETAL: Normal range of motion. No edema and no tenderness. 2+ distal pulses.    Labs: Results for orders placed or performed in visit on 11/04/16 (from the past 336 hour(s))  Beta HCG, Quant   Collection Time: 11/04/16 12:21 PM  Result Value Ref Range   hCG Quant 2,012 mIU/mL  Urine Culture   Collection Time: 11/04/16  1:34 PM  Result Value Ref Range    Urine Culture, Routine Final report    Organism ID, Bacteria Comment     Lab Results  Component Value Date   WBC 6.0 08/15/2016   HGB 14.7 08/15/2016   HCT 43.0 08/15/2016   MCV 92 08/15/2016   PLT 284 08/15/2016    Imaging Studies: US Ob Transvaginal  Result Date: 11/16/2016 ULTRASOUND REPORT Location: [facility] Date of Service:  11/15/16 Indications:Viability for severe abdomen pain Findings: PROBABLE NON-VIABLE Singleton intrauterine pregnancy is visualized with a probable fetal pole with a CRL consistent with [redacted] weeks gestation, giving an (U/S) EDD of 07/11/17. The (U/S) EDD IS NOT consistent with the clinically established (LMP) EDD of 07/02/17. A 5 1/[redacted] weeks gestational sac was noted on U/S from 11/04/16.  The gestational sac measures 5 6/7 weeks on today's exam. FHR: No fetal heart tones CRL measurement: 4.6 mm Yolk sac appears prominent at 5.1 mm. Right Ovary measures 2.3 x 2.1 x 1.8 cm and appears WNL. Left Ovary measures 1.6 x 1.4 x 1.4 cm and appears WNL. There is evidence of a corpus luteal cyst in the right ovary. Survey of the adnexa demonstrates no adnexal masses. There is no free peritoneal fluid in the cul de sac. Impression: 1. 6 week probable non-viable Singleton Intrauterine pregnancy by U/S. 2. (U/S) EDD IS NOT consistent with Clinically established (LMP) EDD of 07/02/17. Recommendations: 1.Clinical correlation with the patient's History and Physical Exam. Kari Baars, RDMS Scan reviewed and agree with findings, Melody Shambley,CNM   US Ob Transvaginal  Result Date: 11/04/2016 ULTRASOUND REPORT Location: [facility] Date of Service: 11/04/16 Indications: Cramping in early pregnancy Findings: A possible small gestational sac is identified within the endometrium measuring 5 1/[redacted] weeks gestation (4.25mm) with an U/S EDD of 07/06/17. The (U/S) EDD is consistent with the clinically established (LMP) EDD of 07/02/17.  No fetal pole was identified at this time. Right  Ovary measures 3.3 x 2.1 x 2.1 cm and appears WNL. Left Ovary measures 2.4 x 1.1 x 1.5 cm and appears WNL. There is evidence of a corpus luteal cyst in the right ovary. Survey of the adnexa demonstrates no adnexal masses. There is no free peritoneal fluid in the cul de sac. Impression: 1. A possible small gestational sac measuring approx. 5 1/[redacted] weeks gestation (4.6mm) was identified. 2. (U/S) EDD is consistent with Clinically established (LMP) EDD of 07/02/17. Recommendations: 1.Clinical correlation with the patient's History and Physical Exam. 2. F/U U/S recommended in 2-3 weeks to confirm the presence of a fetal pole and viability. Kari Baars, RDMS Scan reviewed and agree with findings.  Melody Shambley,CNM    Assessment:    Missed abortion at 7 weeks Pelvic pain Dyspareunia Heavy menses   Plan:    Counseling: Procedure, risks, reasons, benefits and complications (including injury to bowel, bladder, major blood vessel, ureter, bleeding, possibility of transfusion, infection, or fistula formation) reviewed in detail. Likelihood of success in alleviating the patient's condition was discussed. Routine postoperative instructions will be  reviewed with the patient and her family in detail after surgery.  The patient concurred with the proposed plan, giving informed written consent for the surgery.   Preop testing ordered. Instructions reviewed, including NPO after midnight.     Hildred Laser, MD [facility]

## 2016-11-18 ENCOUNTER — Ambulatory Visit: Payer: 59 | Admitting: Certified Registered"

## 2016-11-18 ENCOUNTER — Other Ambulatory Visit: Payer: 59

## 2016-11-18 ENCOUNTER — Ambulatory Visit
Admission: RE | Admit: 2016-11-18 | Discharge: 2016-11-18 | Disposition: A | Discharge status: Home / Self Care | Payer: 59 | Source: Ambulatory Visit | Attending: Obstetrics and Gynecology | Admitting: Obstetrics and Gynecology

## 2016-11-18 ENCOUNTER — Encounter
Admission: RE | Disposition: A | Discharge status: Home / Self Care | Payer: Self-pay | Source: Ambulatory Visit | Attending: Obstetrics and Gynecology

## 2016-11-18 DIAGNOSIS — O021 Missed abortion: Secondary | ICD-10-CM | POA: Insufficient documentation

## 2016-11-18 HISTORY — PX: LAPAROSCOPY: SHX197

## 2016-11-18 HISTORY — PX: DILATION AND EVACUATION: SHX1459

## 2016-11-18 LAB — TYPE AND SCREEN
ABO/RH(D): A POS
Antibody Screen: NEGATIVE

## 2016-11-18 LAB — CBC
HCT: 38.2 % (ref 35.0–47.0)
Hemoglobin: 13.4 g/dL (ref 12.0–16.0)
MCH: 31.7 pg (ref 26.0–34.0)
MCHC: 35.2 g/dL (ref 32.0–36.0)
MCV: 90.1 fL (ref 80.0–100.0)
Platelets: 225 K/uL (ref 150–440)
RBC: 4.23 MIL/uL (ref 3.80–5.20)
RDW: 12.7 % (ref 11.5–14.5)
WBC: 4.8 K/uL (ref 3.6–11.0)

## 2016-11-18 SURGERY — DILATION AND EVACUATION, UTERUS
Anesthesia: General | Wound class: Clean Contaminated

## 2016-11-18 MED ORDER — BUPIVACAINE HCL (PF) 0.5 % IJ SOLN
INTRAMUSCULAR | Status: AC
  Filled 2016-11-18: qty 30

## 2016-11-18 MED ORDER — FENTANYL CITRATE (PF) 100 MCG/2ML IJ SOLN
INTRAMUSCULAR | Status: DC | PRN
  Administered 2016-11-18 (×5): 50 ug via INTRAVENOUS

## 2016-11-18 MED ORDER — MIDAZOLAM HCL 2 MG/2ML IJ SOLN
INTRAMUSCULAR | Status: AC
  Filled 2016-11-18: qty 2

## 2016-11-18 MED ORDER — LIDOCAINE HCL (PF) 2 % IJ SOLN
INTRAMUSCULAR | Status: AC
  Filled 2016-11-18: qty 10

## 2016-11-18 MED ORDER — ROCURONIUM BROMIDE 100 MG/10ML IV SOLN
INTRAVENOUS | Status: DC | PRN
  Administered 2016-11-18: 40 mg via INTRAVENOUS
  Administered 2016-11-18: 10 mg via INTRAVENOUS

## 2016-11-18 MED ORDER — PROPOFOL 10 MG/ML IV BOLUS
INTRAVENOUS | Status: DC | PRN
  Administered 2016-11-18: 20 mg via INTRAVENOUS
  Administered 2016-11-18: 180 mg via INTRAVENOUS

## 2016-11-18 MED ORDER — GLYCOPYRROLATE 0.2 MG/ML IJ SOLN
INTRAMUSCULAR | Status: DC | PRN
  Administered 2016-11-18: 0.2 mg via INTRAVENOUS

## 2016-11-18 MED ORDER — HYDROCODONE-ACETAMINOPHEN 5-325 MG PO TABS: 1.0000 | ORAL_TABLET | Freq: Four times a day (QID) | ORAL | 0 refills | Status: DC | PRN

## 2016-11-18 MED ORDER — ONDANSETRON HCL 4 MG/2ML IJ SOLN
INTRAMUSCULAR | Status: AC
  Filled 2016-11-18: qty 2

## 2016-11-18 MED ORDER — FENTANYL CITRATE (PF) 250 MCG/5ML IJ SOLN
INTRAMUSCULAR | Status: AC
  Filled 2016-11-18: qty 5

## 2016-11-18 MED ORDER — MIDAZOLAM HCL 2 MG/2ML IJ SOLN
INTRAMUSCULAR | Status: DC | PRN
  Administered 2016-11-18: 2 mg via INTRAVENOUS

## 2016-11-18 MED ORDER — PROPOFOL 10 MG/ML IV BOLUS
INTRAVENOUS | Status: AC
  Filled 2016-11-18: qty 20

## 2016-11-18 MED ORDER — ACETAMINOPHEN 10 MG/ML IV SOLN
INTRAVENOUS | Status: AC
  Filled 2016-11-18: qty 100

## 2016-11-18 MED ORDER — GLYCOPYRROLATE 0.2 MG/ML IJ SOLN
INTRAMUSCULAR | Status: AC
  Filled 2016-11-18: qty 1

## 2016-11-18 MED ORDER — KETOROLAC TROMETHAMINE 30 MG/ML IJ SOLN
INTRAMUSCULAR | Status: AC
  Filled 2016-11-18: qty 1

## 2016-11-18 MED ORDER — SUGAMMADEX SODIUM 200 MG/2ML IV SOLN
INTRAVENOUS | Status: DC | PRN
  Administered 2016-11-18: 150 mg via INTRAVENOUS

## 2016-11-18 MED ORDER — LIDOCAINE HCL (CARDIAC) 20 MG/ML IV SOLN
INTRAVENOUS | Status: DC | PRN
  Administered 2016-11-18: 50 mg via INTRAVENOUS

## 2016-11-18 MED ORDER — BUPIVACAINE HCL 0.5 % IJ SOLN
INTRAMUSCULAR | Status: DC | PRN
  Administered 2016-11-18: 10 mL

## 2016-11-18 MED ORDER — IBUPROFEN 800 MG PO TABS: 800.0000 mg | ORAL_TABLET | Freq: Three times a day (TID) | ORAL | 1 refills | Status: DC | PRN

## 2016-11-18 MED ORDER — FENTANYL CITRATE (PF) 100 MCG/2ML IJ SOLN
INTRAMUSCULAR | Status: AC
  Administered 2016-11-18: 25 ug via INTRAVENOUS
  Filled 2016-11-18: qty 2

## 2016-11-18 MED ORDER — DEXAMETHASONE SODIUM PHOSPHATE 10 MG/ML IJ SOLN
INTRAMUSCULAR | Status: DC | PRN
  Administered 2016-11-18: 10 mg via INTRAVENOUS

## 2016-11-18 MED ORDER — ONDANSETRON HCL 4 MG/2ML IJ SOLN: 4.0000 mg | Freq: Once | INTRAMUSCULAR | Status: DC | PRN

## 2016-11-18 MED ORDER — ACETAMINOPHEN 10 MG/ML IV SOLN
INTRAVENOUS | Status: DC | PRN
  Administered 2016-11-18: 1000 mg via INTRAVENOUS

## 2016-11-18 MED ORDER — FENTANYL CITRATE (PF) 100 MCG/2ML IJ SOLN
25.0000 ug | INTRAMUSCULAR | Status: DC | PRN
  Administered 2016-11-18 (×2): 25 ug via INTRAVENOUS

## 2016-11-18 MED ORDER — SUGAMMADEX SODIUM 200 MG/2ML IV SOLN
INTRAVENOUS | Status: AC
  Filled 2016-11-18: qty 2

## 2016-11-18 MED ORDER — ONDANSETRON HCL 4 MG/2ML IJ SOLN
INTRAMUSCULAR | Status: DC | PRN
Start: 2016-11-18 — End: 2016-11-18
  Administered 2016-11-18: 4 mg via INTRAVENOUS

## 2016-11-18 MED ORDER — LACTATED RINGERS IV SOLN
INTRAVENOUS | Status: DC
  Administered 2016-11-18: 09:00:00 via INTRAVENOUS

## 2016-11-18 MED ORDER — LACTATED RINGERS IV SOLN: INTRAVENOUS | Status: DC

## 2016-11-18 MED ORDER — ROCURONIUM BROMIDE 50 MG/5ML IV SOLN
INTRAVENOUS | Status: AC
  Filled 2016-11-18: qty 1

## 2016-11-18 SURGICAL SUPPLY — 52 items
BLADE SURG SZ11 CARB STEEL (BLADE) ×3 IMPLANT
CANISTER SUCT 1200ML W/VALVE (MISCELLANEOUS) ×3 IMPLANT
CATH ROBINSON RED A/P 16FR (CATHETERS) ×3 IMPLANT
CHLORAPREP W/TINT 26ML (MISCELLANEOUS) ×3 IMPLANT
CORD MONOPOLAR M/FML 12FT (MISCELLANEOUS) IMPLANT
DERMABOND ADVANCED (GAUZE/BANDAGES/DRESSINGS) ×2
DERMABOND ADVANCED .7 DNX12 (GAUZE/BANDAGES/DRESSINGS) ×1 IMPLANT
DRSG TELFA 3X8 NADH (GAUZE/BANDAGES/DRESSINGS) ×3 IMPLANT
FILTER UTR ASPR SPEC (MISCELLANEOUS) ×1 IMPLANT
FLTR UTR ASPR SPEC (MISCELLANEOUS) ×3
GLOVE BIO SURGEON STRL SZ 6.5 (GLOVE) ×2 IMPLANT
GLOVE BIO SURGEON STRL SZ8 (GLOVE) ×3 IMPLANT
GLOVE BIO SURGEONS STRL SZ 6.5 (GLOVE) ×1
GLOVE INDICATOR 7.0 STRL GRN (GLOVE) ×3 IMPLANT
GLOVE INDICATOR 8.0 STRL GRN (GLOVE) ×3 IMPLANT
GOWN STRL REUS W/ TWL LRG LVL3 (GOWN DISPOSABLE) ×2 IMPLANT
GOWN STRL REUS W/TWL LRG LVL3 (GOWN DISPOSABLE) ×4
GOWN STRL REUS W/TWL XL LVL4 (GOWN DISPOSABLE) ×3 IMPLANT
IRRIGATION STRYKERFLOW (MISCELLANEOUS) IMPLANT
IRRIGATOR STRYKERFLOW (MISCELLANEOUS)
IV LACTATED RINGERS 1000ML (IV SOLUTION) ×3 IMPLANT
KIT BERKELEY 1ST TRIMESTER 3/8 (MISCELLANEOUS) ×3 IMPLANT
KIT PINK PAD W/HEAD ARE REST (MISCELLANEOUS) ×3
KIT PINK PAD W/HEAD ARM REST (MISCELLANEOUS) ×1 IMPLANT
KIT RM TURNOVER CYSTO AR (KITS) ×3 IMPLANT
KIT RM TURNOVER STRD PROC AR (KITS) ×3 IMPLANT
NEEDLE HYPO 25X1 1.5 SAFETY (NEEDLE) ×3 IMPLANT
NEEDLE SPNL 25GX3.5 QUINCKE BL (NEEDLE) ×3 IMPLANT
NS IRRIG 500ML POUR BTL (IV SOLUTION) ×3 IMPLANT
PACK DNC HYST (MISCELLANEOUS) ×3 IMPLANT
PACK GYN LAPAROSCOPIC (MISCELLANEOUS) ×3 IMPLANT
PAD OB MATERNITY 4.3X12.25 (PERSONAL CARE ITEMS) ×3 IMPLANT
PAD PREP 24X41 OB/GYN DISP (PERSONAL CARE ITEMS) ×3 IMPLANT
POUCH ENDO CATCH 10MM SPEC (MISCELLANEOUS) IMPLANT
SCISSORS METZENBAUM CVD 33 (INSTRUMENTS) IMPLANT
SET BERKELEY SUCTION TUBING (SUCTIONS) ×3 IMPLANT
SHEARS HARMONIC ACE PLUS 36CM (ENDOMECHANICALS) IMPLANT
SLEEVE ENDOPATH XCEL 5M (ENDOMECHANICALS) ×3 IMPLANT
SOL PREP PVP 2OZ (MISCELLANEOUS) ×3
SOLUTION PREP PVP 2OZ (MISCELLANEOUS) ×1 IMPLANT
SPONGE XRAY 4X4 16PLY STRL (MISCELLANEOUS) IMPLANT
SUT VIC AB 3-0 SH 27 (SUTURE)
SUT VIC AB 3-0 SH 27X BRD (SUTURE) IMPLANT
SUT VICRYL 0 AB UR-6 (SUTURE) IMPLANT
SYRINGE 10CC LL (SYRINGE) ×3 IMPLANT
TROCAR ENDO BLADELESS 11MM (ENDOMECHANICALS) ×3 IMPLANT
TROCAR XCEL NON-BLD 5MMX100MML (ENDOMECHANICALS) ×3 IMPLANT
TROCAR XCEL UNIV SLVE 11M 100M (ENDOMECHANICALS) ×3 IMPLANT
TUBING INSUFFLATOR HI FLOW (MISCELLANEOUS) ×3 IMPLANT
VACURETTE 10 RIGID CVD (CANNULA) IMPLANT
VACURETTE 12 RIGID CVD (CANNULA) IMPLANT
VACURETTE 8 RIGID CVD (CANNULA) IMPLANT

## 2016-11-18 NOTE — Op Note (Addendum)
Procedure(s): DILATATION AND EVACUATION LAPAROSCOPY DIAGNOSTIC Procedure Note  Mary BucksChristen Parsons female 26 y.o. 11/18/2016  Indications: The patient is a 26 y.o. 262P1001 female with missed abortion at ~ [redacted] weeks gestation. Also with h/o pelvic pain, dyspareunia, and heavy menses, with family h/o endometriosis.   Pre-operative Diagnosis: 1) missed abortion at ~7 weeks,  2) pelvic pain,  3) dyspareunia,  4) history of heavy menses  Post-operative Diagnosis: Same with lesions suspicious for endometriosis  Surgeon: Hildred LaserAnika Wendle Kina, MD  Assistants: Surgical scrub tech  Anesthesia: General endotracheal anesthesia  Findings: The uterus was sounded to 10 cm.  Fallopian tubes and ovaries appeared normal.  White fibrotic stellate tissue noted at entry point of round ligament into the left pelvic sidewall.  Biopsy taken here.  Several blue-purple lesions in posterior cul-de-sac, also with an area of pseudo-fenestration; biopsies taken here.  Anterior cul-de-sac appeared normal without lesions. Bilateral ovarian fossa without lesions, normal.  Small adhesion of omentum to anterior abdominal wall.  Tissue consistent with products of conception was removed and sent to Pathology.  Procedure Details: The patient was seen in the Holding Room. The risks, benefits, complications, treatment options, and expected outcomes were discussed with the patient.  The patient concurred with the proposed plan, giving informed consent.  The site of surgery properly noted/marked. The patient was taken to the Operating Room, identified as Mary Buckshristen Manon and the procedure verified as Procedure(s) (LRB): DILATATION AND EVACUATION (N/A) LAPAROSCOPY DIAGNOSTIC (N/A). A Time Out was held and the above information confirmed.  She was then placed under general anesthesia without difficulty. She was placed in the dorsal lithotomy position, and was prepped and draped in a sterile manner.  A straight catheterization was performed.  A sterile speculum was inserted into the vagina and the cervix was grasped at the anterior lip using a single-toothed tenaculum.  The uterus was sounded to 10 cm, and a Hulka clamp was placed for uterine manipulation.  The speculum and tenaculum were then removed. After an adequate timeout was performed, attention was turned to the abdomen where an umbilical incision was made with the scalpel.  The Optiview 5-mm trocar and sleeve were then advanced without difficulty with the laparoscope under direct visualization into the abdomen.  The abdomen was then insufflated with carbon dioxide gas and adequate pneumoperitoneum was obtained. A 5-mm right lower quadrant port was then placed under direct visualization.  A survey of the patient's pelvis and abdomen revealed the findings as above.  Biopsies of the posterior cul-da-sac and left pelvic sidewall were performed.    A final survey of the pelvis noted good hemostasis at biopsy sites.  All trocars were removed under direct visualization, and the abdomen which was desufflated.  All skin incisions were closed with Dermabond.  The incisions were each injected with 5 cc of 1% Sensorcaine.   Attention was then turned to the pelvis.  The Hulka clamp was removed.  A weighted speculum was placed into  the vagina and a single tooth tenaculum was again applied to the anterior lip of the cervix. The cervix did not require dilation.  A 6 mm suction curette, that was gently advanced to the uterine fundus.  The suction device was then activated and the curette was slowly rotated to clear the uterine cavity of products of conception.  A sharp curettage with a serrated curette  was then performed to confirm complete emptying of the uterus. Minimal bleeding was encountered. The tenaculum was removed along with all instruments  from the  vagina.   The patient was awakened, mobilized and taken to the recovery room in satisfactory condition.  The procedure was well-tolerated.  All  instruments, needles, and sponge counts were correct x 2. The patient was taken to the recovery room awake, extubated and in stable condition.    The patient will be discharged to home as per PACU criteria.  Routine postoperative instructions were given along with a prescription for analgesics.  She will follow up in the clinic in 1-2 weeks for postoperative evaluation.   Estimated Blood Loss:  50 ml      Drains: straight catheterization prior to procedure with 30 ml of clear urine         Total IV Fluids: 500  ml  Specimens: Products of conception, peritoneal biopsies of the left pelvic sidewall and posterior cul-de-sac.          Implants: None         Complications:  None; patient tolerated the procedure well.         Disposition: PACU - hemodynamically stable.         Condition: stable   Hildred Laser, MD [facility]

## 2016-11-18 NOTE — Discharge Instructions (Addendum)
AMBULATORY SURGERY  °DISCHARGE INSTRUCTIONS ° ° °1) The drugs that you were given will stay in your system until tomorrow so for the next 24 hours you should not: ° °A) Drive an automobile °B) Make any legal decisions °C) Drink any alcoholic beverage ° ° °2) You may resume regular meals tomorrow.  Today it is better to start with liquids and gradually work up to solid foods. ° °You may eat anything you prefer, but it is better to start with liquids, then soup and crackers, and gradually work up to solid foods. ° ° °3) Please notify your doctor immediately if you have any unusual bleeding, trouble breathing, redness and pain at the surgery site, drainage, fever, or pain not relieved by medication. ° °Please contact your physician with any problems or Same Day Surgery at [phone], Monday through Friday 6 am to 4 pm, or [facility] number at [phone]. °

## 2016-11-18 NOTE — Transfer of Care (Signed)
Immediate Anesthesia Transfer of Care Note  Patient: Mary Parsons  Procedure(s) Performed: DILATATION AND EVACUATION (N/A ) LAPAROSCOPY DIAGNOSTIC (N/A )  Patient Location: PACU  Anesthesia Type:General  Level of Consciousness: sedated  Airway & Oxygen Therapy: Patient Spontanous Breathing and Patient connected to face mask oxygen  Post-op Assessment: Report given to RN and Post -op Vital signs reviewed and stable  Post vital signs: Reviewed  Last Vitals:  Vitals:   11/18/16 0816 11/18/16 1202  BP: 122/60 (!) 112/58  Pulse: 78 67  Resp: 17 14  Temp: (!) 36.3 C 37.3 C  SpO2: 100% 97%    Last Pain:  Vitals:   11/18/16 0816  TempSrc: Tympanic  PainSc: 6          Complications: No apparent anesthesia complications

## 2016-11-18 NOTE — H&P (Addendum)
UPDATE TO PREVIOUS HISTORY AND PHYSICAL  The patient has been seen and examined.  H&P is up to date, no changes noted. Mary BucksChristen Millea is a 26 y.o. G2P1001 here for surgical management of missed abortion at ~7 weeks.  Patient also with a history of heavy menses, pelvic pain and dyspareunia, symptoms suspicious for endometriosis, as well as a family history of endometriosis.  Planned procedure is laparoscopy with biopsies, and suction D&C. Patient can proceed to the OR for scheduled procedure.   Hildred Laserherry, Magdalene Tardiff, MD 11/18/2016 10:20 AM

## 2016-11-18 NOTE — Anesthesia Post-op Follow-up Note (Signed)
Anesthesia QCDR form completed.        

## 2016-11-18 NOTE — Anesthesia Preprocedure Evaluation (Signed)
Anesthesia Evaluation  Patient identified by MRN, date of birth, ID band Patient awake    Reviewed: Allergy & Precautions, NPO status , Patient's Chart, lab work & pertinent test results  Airway Mallampati: II  TM Distance: >3 FB     Dental  (+) Teeth Intact   Pulmonary neg pulmonary ROS,    Pulmonary exam normal        Cardiovascular negative cardio ROS Normal cardiovascular exam     Neuro/Psych negative neurological ROS  negative psych ROS   GI/Hepatic negative GI ROS, Neg liver ROS,   Endo/Other  negative endocrine ROS  Renal/GU negative Renal ROS  Female GU complaint     Musculoskeletal negative musculoskeletal ROS (+)   Abdominal Normal abdominal exam  (+)   Peds negative pediatric ROS (+)  Hematology negative hematology ROS (+)   Anesthesia Other Findings   Reproductive/Obstetrics                             Anesthesia Physical Anesthesia Plan  ASA: I  Anesthesia Plan: General   Post-op Pain Management:    Induction: Intravenous  PONV Risk Score and Plan:   Airway Management Planned: Oral ETT  Additional Equipment:   Intra-op Plan:   Post-operative Plan: Extubation in OR  Informed Consent: I have reviewed the patients History and Physical, chart, labs and discussed the procedure including the risks, benefits and alternatives for the proposed anesthesia with the patient or authorized representative who has indicated his/her understanding and acceptance.   Dental advisory given  Plan Discussed with: CRNA and Surgeon  Anesthesia Plan Comments:         Anesthesia Quick Evaluation

## 2016-11-18 NOTE — Anesthesia Procedure Notes (Signed)
Procedure Name: Intubation Performed by: Adalene Gulotta Pre-anesthesia Checklist: Patient identified, Patient being monitored, Timeout performed, Emergency Drugs available and Suction available Patient Re-evaluated:Patient Re-evaluated prior to induction Oxygen Delivery Method: Circle system utilized Preoxygenation: Pre-oxygenation with 100% oxygen Induction Type: IV induction Ventilation: Mask ventilation without difficulty Laryngoscope Size: Miller and 2 Grade View: Grade I Tube type: Oral Tube size: 7.0 mm Number of attempts: 1 Airway Equipment and Method: Stylet Placement Confirmation: ETT inserted through vocal cords under direct vision,  positive ETCO2 and breath sounds checked- equal and bilateral Secured at: 21 cm Tube secured with: Tape Dental Injury: Teeth and Oropharynx as per pre-operative assessment        

## 2016-11-18 NOTE — Anesthesia Postprocedure Evaluation (Signed)
Anesthesia Post Note  Patient: [name]  Procedure(s) Performed: DILATATION AND EVACUATION (N/A ) LAPAROSCOPY DIAGNOSTIC (N/A )  Patient location during evaluation: PACU Anesthesia Type: General Level of consciousness: awake and alert and oriented Pain management: pain level controlled Vital Signs Assessment: post-procedure vital signs reviewed and stable Respiratory status: spontaneous breathing Cardiovascular status: blood pressure returned to baseline Anesthetic complications: no     Last Vitals:  Vitals:   11/18/16 1217 11/18/16 1233  BP: 128/73 130/72  Pulse: 74   Resp:    Temp:    SpO2:      Last Pain:  Vitals:   11/18/16 1230  TempSrc:   PainSc: 7                  Babacar Haycraft

## 2016-11-21 ENCOUNTER — Telehealth: Payer: Self-pay | Admitting: Obstetrics and Gynecology

## 2016-11-21 ENCOUNTER — Encounter: Payer: Self-pay | Admitting: Obstetrics and Gynecology

## 2016-11-21 LAB — SURGICAL PATHOLOGY

## 2016-11-21 NOTE — Telephone Encounter (Signed)
Pts Mother called and states pts temp is elevated. Spoke to pt and she states her temp is 103.1. Last ibup at 7 am. No other sx. Advised pt to take tylenol 1000g q6 and ibup 800mg  q8. Push fluids. Appt made for 8:15 tomorrow. She did state the gas medicine has helped her shoulder and chest pain. Pt aware if temp is unable to stay down with OTC she will need to be seen at ED. Pt voices understanding. Asked pt to call me back and let me know how she is at 4pm.

## 2016-11-21 NOTE — Telephone Encounter (Signed)
Pt is s/p d&c and lap on 11/2. She c/o of shoulder and chest pain. Low grade fever 100.0. She is taking ibup 800 q8h. No norco needed. Vaginal bleeding changing q 2h(mainly for cleanliness). NO n/v/d. Pos eating and drinking. NO uti sx. No draining from incision sites. Possible gas. Advised pt to get mylanta or maalox. If fever gets above 100.3, soaking a pad q  30 minutes or pain not relived with ibup to contact office. Pt voices understanding.

## 2016-11-21 NOTE — Telephone Encounter (Signed)
Patient called stating she had suction D&E on Friday. She is now experiencing severe cramping and a low grade fever. Please Advise .Thanks

## 2016-11-22 ENCOUNTER — Encounter: Payer: Self-pay | Admitting: Obstetrics and Gynecology

## 2016-11-22 ENCOUNTER — Encounter: Payer: 59 | Admitting: Obstetrics and Gynecology

## 2016-11-22 ENCOUNTER — Ambulatory Visit (INDEPENDENT_AMBULATORY_CARE_PROVIDER_SITE_OTHER): Payer: 59 | Admitting: Obstetrics and Gynecology

## 2016-11-22 VITALS — BP 106/67 | HR 103 | Temp 98.8°F | Ht 62.0 in | Wt 160.7 lb

## 2016-11-22 DIAGNOSIS — Z9889 Other specified postprocedural states: Secondary | ICD-10-CM

## 2016-11-22 DIAGNOSIS — R5082 Postprocedural fever: Secondary | ICD-10-CM

## 2016-11-22 DIAGNOSIS — R140 Abdominal distension (gaseous): Secondary | ICD-10-CM

## 2016-11-22 MED ORDER — DOXYCYCLINE HYCLATE 100 MG PO CAPS: 100.0000 mg | ORAL_CAPSULE | Freq: Two times a day (BID) | ORAL | 0 refills | Status: DC

## 2016-11-22 NOTE — Patient Instructions (Signed)
Cancel appointment for next week. To f/u in 2-3 weeks with Mary Parsons

## 2016-11-22 NOTE — Progress Notes (Addendum)
OBSTETRICS/GYNECOLOGY POST-OPERATIVE CLINIC VISIT  Subjective:     Mary Parsons is a 26 y.o. 612P1011 female who presents to the clinic 4 days post-op status post laparoscopy with biopsies and suction D&C for evaluation of pelvic pain, dyspareunia (searching for endometriosis) and missed Abortion at [redacted] weeks gestation. Eating a regular diet without difficulty. Bowel movements are normal. Pain is controlled with current analgesics. Medications being used: prescription NSAID's including ibuprofen (Motrin) and narcotic analgesics including oxycodone/acetaminophen (Percocet, Tylox).  Of note, patient states that she has been running a fever since yesterday.  States that it got as high as 103.  Denies foul smelling vaginal discharge. Reports bleeding has been light.  Notes appropriate abdominal pain and tenderness with no redness around the incision sites. Denies urinary discomfort.  Additionally she notes abdominal bloating and discomfort, stating that sometimes it feels difficult to breath. Pain radiates up to shoulders, as well as to backside of ribs. Is taking Gas-X once daily.  Has been alternating with Tylenol and Motrin for fevers. Last fever was this morning, temp was 101.   The following portions of the patient's history were reviewed and updated as appropriate: allergies, current medications, past family history, past medical history, past social history, past surgical history and problem list.  Review of Systems Pertinent items noted in HPI and remainder of comprehensive ROS otherwise negative.    Objective:    BP 106/67 (BP Location: Left Arm, Patient Position: Sitting, Cuff Size: Normal)   Pulse (!) 103   Temp 98.8 F (37.1 C)   Ht 5\' 2"  (1.575 m)   Wt 160 lb 11.2 oz (72.9 kg)   LMP 09/25/2016 (Exact Date)   BMI 29.39 kg/m  General:  alert and no distress  Abdomen: soft, bowel sounds active, mildly tender near incision sites, appropriate.  Mild tympany present.   Incision:    healing well, no drainage, no erythema, no hernia, no seroma, no swelling, no dehiscence, incision well approximated   Pelvis:  external genitalia normal, rectovaginal septum normal.  Vagina with scant brown-yellow discharge.  Cervix normal appearing, no lesions and no motion tenderness.  Uterus mobile, nontender, normal shape and size.  Adnexae non-palpable, nontender bilaterally.   Extremities:  non-tender, no edema or swelling    Pathology:  DIAGNOSIS:  A. LEFT PELVIC SIDEWALL; BIOPSY:  - MESOTHELIAL LINED FIBROADIPOSE TISSUE, CONSISTENT WITH FIBROUS  ADHESION.   B. POSTERIOR CUL-DE-SAC; BIOPSY:  - MESOTHELIAL LINED FIBROADIPOSE TISSUE, CONSISTENT WITH FIBROUS  ADHESION.   C. PRODUCTS OF CONCEPTION; DILATION AND EVACUATION:  - DECIDUA, GESTATIONAL ENDOMETRIUM AND CHORIONIC VILLI, CONSISTENT WITH  PRODUCTS OF CONCEPTION.   Assessment:    Postoperative course complicated by febrile morbidity  Abdominal bloating   Plan:   1. Continue any current medications.  Advised on increasing  Gas-X to at least 3-4 times daily over the next few days.  2. Wound care discussed.  Currently healing well, with Dermabond in place over incisions.  3. Operative findings again reviewed. Pathology report discussed. 4. Discussion had on febrile morbidity.  No obvious cause noted.  Patient was treated with antibiotics prior to surgery.  No evidence of infection at either surgical site (abdomen, uterine cavity).  No urinary symptoms. Will start with antibiotic treatment of doxycyline 100 mg BID.  If febrile morbidity does not resolve, will change to a more broad spectrum antibiotic. Continue alternating Tylenol and Motrin for fever.  Activity restrictions: no bending, stooping, or squatting, no lifting more than 10 pounds and pelvic rest  x 1 week 5. Anticipated return to work: 1-2 weeks. 6. Follow up: 2-3 weeks with midwives to discuss further management of menstrual symptoms     Hildred Laser,  MD [facility]

## 2016-11-29 ENCOUNTER — Encounter: Payer: 59 | Admitting: Obstetrics and Gynecology

## 2016-12-06 ENCOUNTER — Encounter: Payer: Self-pay | Admitting: Certified Nurse Midwife

## 2016-12-06 ENCOUNTER — Other Ambulatory Visit: Payer: Self-pay

## 2016-12-06 ENCOUNTER — Ambulatory Visit (INDEPENDENT_AMBULATORY_CARE_PROVIDER_SITE_OTHER): Payer: 59 | Admitting: Certified Nurse Midwife

## 2016-12-06 VITALS — BP 128/68 | HR 83 | Wt 159.8 lb

## 2016-12-06 DIAGNOSIS — Z30019 Encounter for initial prescription of contraceptives, unspecified: Secondary | ICD-10-CM | POA: Diagnosis not present

## 2016-12-06 LAB — POCT URINE PREGNANCY: Preg Test, Ur: NEGATIVE

## 2016-12-06 MED ORDER — LEVONORGEST-ETH ESTRAD 91-DAY 0.15-0.03 MG PO TABS: 1.0000 | ORAL_TABLET | Freq: Every day | ORAL | 4 refills | Status: DC

## 2016-12-06 NOTE — Patient Instructions (Signed)
Ethinyl Estradiol; Levonorgestrel tablets What is this medicine? ETHINYL ESTRADIOL; LEVONORGESTREL (ETH in il es tra DYE ole; LEE voh nor jes trel) is an oral contraceptive. It combines two types of female hormones, an estrogen and a progestin. They are used to prevent ovulation and pregnancy. This medicine may be used for other purposes; ask your health care provider or pharmacist if you have questions. COMMON BRAND NAME(S): Alesse, Altavera, Amethia, Amethia Lo, Amethyst, Ashlyna, Aubra-28, Aviane, Camrese, Camrese Lo, Chateal, Daysee, Delyla, Enpresse, FALMINA, Fayosin, Introvale, Isibloom, Jolessa, Kurvelo, Lessina, Levlen, Levlite, LEVONEST, Levonorgestrel/Ethinyl Estradiol, Levora, LoSeasonique, Lutera, Lybrel, MARLISSA, Myzilra, Nordette, Orsythia, Portia, Quartette, Quasense, Seasonale, Seasonique, Setlakin, Sronyx, Tri-Levlen, Triphasil, Trivora, Vienva What should I tell my health care provider before I take this medicine? They need to know if you have or ever had any of these conditions: -abnormal vaginal bleeding -blood vessel disease or blood clots -breast, cervical, endometrial, ovarian, liver, or uterine cancer -diabetes -gallbladder disease -heart disease or recent heart attack -high blood pressure -high cholesterol -kidney disease -liver disease -migraine headaches -stroke -systemic lupus erythematosus (SLE) -tobacco smoker -an unusual or allergic reaction to estrogens, progestins, other medicines, foods, dyes, or preservatives -pregnant or trying to get pregnant -breast-feeding How should I use this medicine? Take this medicine by mouth. To reduce nausea, this medicine may be taken with food. Follow the directions on the prescription label. Take this medicine at the same time each day and in the order directed on the package. Do not take your medicine more often than directed. Contact your pediatrician regarding the use of this medicine in children. Special care may be  needed. This medicine has been used in female children who have started having menstrual periods. A patient package insert for the product will be given with each prescription and refill. Read this sheet carefully each time. The sheet may change frequently. Overdosage: If you think you have taken too much of this medicine contact a poison control center or emergency room at once. NOTE: This medicine is only for you. Do not share this medicine with others. What if I miss a dose? If you miss a dose, refer to the patient information sheet you received with your medicine for direction. If you miss more than one pill, this medicine may not be as effective and you may need to use another form of birth control. What may interact with this medicine? Do not take this medicine with the following medication: -dasabuvir; ombitasvir; paritaprevir; ritonavir -ombitasvir; paritaprevir; ritonavir This medicine may also interact with the following medications: -acetaminophen -antibiotics or medicines for infections, especially rifampin, rifabutin, rifapentine, and griseofulvin, and possibly penicillins or tetracyclines -aprepitant -ascorbic acid (vitamin C) -atorvastatin -barbiturate medicines, such as phenobarbital -bosentan -carbamazepine -caffeine -clofibrate -cyclosporine -dantrolene -doxercalciferol -felbamate -grapefruit juice -hydrocortisone -medicines for anxiety or sleeping problems, such as diazepam or temazepam -medicines for diabetes, including pioglitazone -mineral oil -modafinil -mycophenolate -nefazodone -oxcarbazepine -phenytoin -prednisolone -ritonavir or other medicines for HIV infection or AIDS -rosuvastatin -selegiline -soy isoflavones supplements -St. John's wort -tamoxifen or raloxifene -theophylline -thyroid hormones -topiramate -warfarin This list may not describe all possible interactions. Give your health care provider a list of all the medicines, herbs,  non-prescription drugs, or dietary supplements you use. Also tell them if you smoke, drink alcohol, or use illegal drugs. Some items may interact with your medicine. What should I watch for while using this medicine? Visit your doctor or health care professional for regular checks on your progress. You will need a regular breast and pelvic   exam and Pap smear while on this medicine. Use an additional method of contraception during the first cycle that you take these tablets. If you have any reason to think you are pregnant, stop taking this medicine right away and contact your doctor or health care professional. If you are taking this medicine for hormone related problems, it may take several cycles of use to see improvement in your condition. Smoking increases the risk of getting a blood clot or having a stroke while you are taking birth control pills, especially if you are more than 25 years old. You are strongly advised not to smoke. This medicine can make your body retain fluid, making your fingers, hands, or ankles swell. Your blood pressure can go up. Contact your doctor or health care professional if you feel you are retaining fluid. This medicine can make you more sensitive to the sun. Keep out of the sun. If you cannot avoid being in the sun, wear protective clothing and use sunscreen. Do not use sun lamps or tanning beds/booths. If you wear contact lenses and notice visual changes, or if the lenses begin to feel uncomfortable, consult your eye care specialist. In some women, tenderness, swelling, or minor bleeding of the gums may occur. Notify your dentist if this happens. Brushing and flossing your teeth regularly may help limit this. See your dentist regularly and inform your dentist of the medicines you are taking. If you are going to have elective surgery, you may need to stop taking this medicine before the surgery. Consult your health care professional for advice. This medicine does not  protect you against HIV infection (AIDS) or any other sexually transmitted diseases. What side effects may I notice from receiving this medicine? Side effects that you should report to your doctor or health care professional as soon as possible: -breast tissue changes or discharge -changes in vaginal bleeding during your period or between your periods -chest pain -coughing up blood -dizziness or fainting spells -headaches or migraines -leg, arm or groin pain -severe or sudden headaches -stomach pain (severe) -sudden shortness of breath -sudden loss of coordination, especially on one side of the body -speech problems -symptoms of vaginal infection like itching, irritation or unusual discharge -tenderness in the upper abdomen -vomiting -weakness or numbness in the arms or legs, especially on one side of the body -yellowing of the eyes or skin Side effects that usually do not require medical attention (report to your doctor or health care professional if they continue or are bothersome): -breakthrough bleeding and spotting that continues beyond the 3 initial cycles of pills -breast tenderness -mood changes, anxiety, depression, frustration, anger, or emotional outbursts -increased sensitivity to sun or ultraviolet light -nausea -skin rash, acne, or brown spots on the skin -weight gain (slight) This list may not describe all possible side effects. Call your doctor for medical advice about side effects. You may report side effects to FDA at 1-800-FDA-1088. Where should I keep my medicine? Keep out of the reach of children. Store at room temperature between 15 and 30 degrees C (59 and 86 degrees F). Throw away any unused medicine after the expiration date. NOTE: This sheet is a summary. It may not cover all possible information. If you have questions about this medicine, talk to your doctor, pharmacist, or health care provider.  2018 Elsevier/Gold Standard (2015-09-14 07:58:22)  

## 2016-12-06 NOTE — Progress Notes (Signed)
GYN ENCOUNTER NOTE  Subjective:       Mary Parsons is a 26 y.o. [id number] female here to discuss contraception and period management options.   Reports heavy painful periods and desires something to decrease her symptoms. Previously had IUD and "did not like it". Was prescribed Lo Loestrin by myself, but "not on pill long enough" to see any effect.   Denies difficulty breathing or respiratory distress, chest pain, abdominal pain, vaginal bleeding, dysuria, and leg pain or swelling.    Gynecologic History  Patient's last menstrual period was 09/25/2016 (exact date).  Contraception: none  Obstetric History  OB History  Gravida Para Term Preterm AB Living  2 1 1   1 1   SAB TAB Ectopic Multiple Live Births  1       1    # Outcome Date GA Lbr Len/2nd Weight Sex Delivery Anes PTL Lv  2 SAB 11/18/16 7552w5d         1 Term 08/21/15 [id number]  4 lb 2.2 oz (1.878 kg) M CS-Unspec   LIV     Complications: Fetal Intolerance      Past Medical History:  Diagnosis Date  . Heavy periods     Past Surgical History:  Procedure Laterality Date  . CESAREAN SECTION    . DILATION AND EVACUATION N/A 11/18/2016   Procedure: DILATATION AND EVACUATION;  Surgeon: Hildred Laserherry, Anika, MD;  Location: [facility];  Service: Gynecology;  Laterality: N/A;  . LAPAROSCOPY N/A 11/18/2016   Procedure: LAPAROSCOPY DIAGNOSTIC;  Surgeon: Hildred Laserherry, Anika, MD;  Location: [facility];  Service: Gynecology;  Laterality: N/A;  . TONSILLECTOMY      Current Outpatient Medications on File Prior to Visit  Medication Sig Dispense Refill  . acetaminophen (TYLENOL) 325 MG tablet Take 650 mg by mouth every 6 (six) hours as needed (for pain.).    Marland Kitchen. ibuprofen (ADVIL,MOTRIN) 800 MG tablet Take 1 tablet (800 mg total) by mouth every 8 (eight) hours as needed. 60 tablet 1   No current facility-administered medications on file prior to visit.     Allergies  Allergen Reactions  . Adhesive [Tape] Other (See Comments)    Burns skin  off--tolerates PAPER TAPE ONLY    Social History   Socioeconomic History  . Marital status: Married    Spouse name: Not on file  . Number of children: Not on file  . Years of education: Not on file  . Highest education level: Not on file  Social Needs  . Financial resource strain: Not on file  . Food insecurity - worry: Not on file  . Food insecurity - inability: Not on file  . Transportation needs - medical: Not on file  . Transportation needs - non-medical: Not on file  Occupational History  . Not on file  Tobacco Use  . Smoking status: Never Smoker  . Smokeless tobacco: Never Used  Substance and Sexual Activity  . Alcohol use: Yes    Comment: occas  . Drug use: No  . Sexual activity: Yes    Birth control/protection: None  Other Topics Concern  . Not on file  Social History Narrative  . Not on file    Family History  Problem Relation Age of Onset  . Endometriosis Mother   . Endometriosis Maternal Aunt   . Endometriosis Maternal Grandmother     The following portions of the patient's history were reviewed and updated as appropriate: allergies, current medications, past family history, past medical history, past social history, past surgical  history and problem list.  Review of Systems  Review of Systems - Negative except as noted above History obtained from the patient   Objective:   BP 128/68   Pulse 83   Wt 159 lb 12.8 oz (72.5 kg)   LMP 09/25/2016 (Exact Date) Comment: MISCARRIAGE  Breastfeeding? Unknown   BMI 29.23 kg/m    CONSTITUTIONAL: Well-developed, well-nourished female in no acute distress.   HENT:  Normocephalic, atraumatic.   NECK: Normal range of motion, supple, no masses.    SKIN: Skin is warm and dry. No rash noted. Not diaphoretic. No erythema. No pallor.  NEUROLGIC: Alert and oriented to person, place, and time.   PSYCHIATRIC: Normal mood and affect. Normal behavior. Normal judgment and thought content.  MUSCULOSKELETAL: Normal  range of motion. No tenderness.  No cyanosis, clubbing, or edema.   Assessment:   1. Encounter for initial prescription of contraceptives, unspecified contraceptive  - POCT urine pregnancy   Plan:   Reviewed all forms of birth control options available including abstinence; over the counter/barrier methods; hormonal contraceptive medication including pill, patch, ring, injection,contraceptive implant; hormonal and nonhormonal IUDs. Risks and benefits reviewed.  Questions were answered.  Information was given to patient to review.   Rx: [place], see orders.   Reviewed red flag symptoms and when to call.   RTC x 3 months for follow up or sooner if needed.    Gunnar BullaJenkins Michelle Daesha Insco, CNM

## 2017-02-13 ENCOUNTER — Encounter: Payer: Self-pay | Admitting: Certified Nurse Midwife

## 2017-02-16 ENCOUNTER — Encounter: Payer: 59 | Admitting: Certified Nurse Midwife

## 2017-02-20 ENCOUNTER — Ambulatory Visit: Payer: 59 | Admitting: Certified Nurse Midwife

## 2017-02-20 ENCOUNTER — Encounter: Payer: Self-pay | Admitting: Certified Nurse Midwife

## 2017-02-20 VITALS — BP 123/75 | HR 78 | Ht 62.0 in | Wt 160.9 lb

## 2017-02-20 DIAGNOSIS — R5383 Other fatigue: Secondary | ICD-10-CM | POA: Diagnosis not present

## 2017-02-20 DIAGNOSIS — N921 Excessive and frequent menstruation with irregular cycle: Secondary | ICD-10-CM

## 2017-02-20 MED ORDER — ESTRADIOL 1 MG PO TABS: 1.0000 mg | ORAL_TABLET | Freq: Every day | ORAL | 0 refills | Status: DC

## 2017-02-20 NOTE — Progress Notes (Signed)
GYN ENCOUNTER NOTE  Subjective:       Mary Parsons is a 27 y.o. [id number] female here for gynecologic evaluation of the following issues:  1. Breakthrough bleeding on OCPs 2. Fatigue 3. Pelvic pain/abdominal cramping 4. Family history of endometriosis  Reports intermittent vaginal bleeding with abdominal cramping as well as pelvic pressure that increased with walking. No relief with continuous OCPs.   Denies difficulty breathing or respiratory distress, chest pain, excessive vaginal bleeding, dysuria, and leg pain or swelling.    Gynecologic History  No LMP recorded. Patient is not currently having periods (Reason: Other).  Contraception: OCP (estrogen/progesterone)  Last Pap: due.   Obstetric History  OB History  Gravida Para Term Preterm AB Living  2 1 1   1 1   SAB TAB Ectopic Multiple Live Births  1       1    # Outcome Date GA Lbr Len/2nd Weight Sex Delivery Anes PTL Lv  2 SAB 11/18/16 7555w5d         1 Term 08/21/15 6017w5d  4 lb 2.2 oz (1.878 kg) M CS-Unspec   LIV     Complications: Fetal Intolerance      Past Medical History:  Diagnosis Date  . Heavy periods     Past Surgical History:  Procedure Laterality Date  . CESAREAN SECTION    . DILATION AND EVACUATION N/A 11/18/2016   Procedure: DILATATION AND EVACUATION;  Surgeon: Hildred Laserherry, Anika, MD;  Location: [facility];  Service: Gynecology;  Laterality: N/A;  . LAPAROSCOPY N/A 11/18/2016   Procedure: LAPAROSCOPY DIAGNOSTIC;  Surgeon: Hildred Laserherry, Anika, MD;  Location: [facility];  Service: Gynecology;  Laterality: N/A;  . TONSILLECTOMY      Current Outpatient Medications on File Prior to Visit  Medication Sig Dispense Refill  . ibuprofen (ADVIL,MOTRIN) 800 MG tablet Take 1 tablet (800 mg total) by mouth every 8 (eight) hours as needed. 60 tablet 1  . levonorgestrel-ethinyl estradiol (SEASONALE,INTROVALE,JOLESSA) 0.15-0.03 MG tablet Take 1 tablet by mouth daily. 1 Package 4  . acetaminophen (TYLENOL) 325 MG tablet Take 650  mg by mouth every 6 (six) hours as needed (for pain.).     No current facility-administered medications on file prior to visit.     Allergies  Allergen Reactions  . Adhesive [Tape] Other (See Comments)    Burns skin off--tolerates PAPER TAPE ONLY    Social History   Socioeconomic History  . Marital status: Married    Spouse name: Not on file  . Number of children: Not on file  . Years of education: Not on file  . Highest education level: Not on file  Social Needs  . Financial resource strain: Not on file  . Food insecurity - worry: Not on file  . Food insecurity - inability: Not on file  . Transportation needs - medical: Not on file  . Transportation needs - non-medical: Not on file  Occupational History  . Not on file  Tobacco Use  . Smoking status: Never Smoker  . Smokeless tobacco: Never Used  Substance and Sexual Activity  . Alcohol use: Yes    Comment: occas  . Drug use: No  . Sexual activity: Yes    Birth control/protection: Pill, None  Other Topics Concern  . Not on file  Social History Narrative  . Not on file    Family History  Problem Relation Age of Onset  . Endometriosis Mother   . Endometriosis Maternal Aunt   . Endometriosis Maternal Grandmother  The following portions of the patient's history were reviewed and updated as appropriate: allergies, current medications, past family history, past medical history, past social history, past surgical history and problem list.  Review of Systems  Review of Systems - Negative except as noted above. History obtained from the patient.   Objective:   BP 123/75   Pulse 78   Ht 5\' 2"  (1.575 m)   Wt 160 lb 14.4 oz (73 kg)   BMI 29.43 kg/m   General: Alert and oriented x 4, no apparent distress.   CBC     Status: None   Collection Time: 02/20/17 11:48 AM  Result Value Ref Range   WBC 6.6 3.4 - 10.8 x10E3/uL   RBC 4.68 3.77 - 5.28 x10E6/uL   Hemoglobin 14.6 11.1 - 15.9 g/dL   Hematocrit 16.1  09.6 - 46.6 %   MCV 90 79 - 97 fL   MCH 31.2 26.6 - 33.0 pg   MCHC 34.8 31.5 - 35.7 g/dL   RDW 04.5 40.9 - 81.1 %   Platelets 322 150 - 379 x10E3/uL  Thyroid Panel With TSH     Status: Abnormal   Collection Time: 02/20/17 11:48 AM  Result Value Ref Range   TSH 2.320 0.450 - 4.500 uIU/mL   T4, Total 8.2 4.5 - 12.0 ug/dL   T3 Uptake Ratio 22 (L) 24 - 39 %   Free Thyroxine Index 1.8 1.2 - 4.9  Ferritin     Status: None   Collection Time: 02/20/17 11:48 AM  Result Value Ref Range   Ferritin 149 15 - 150 ng/mL  Vitamin B12     Status: None   Collection Time: 02/20/17 11:48 AM  Result Value Ref Range   Vitamin B-12 431 232 - 1,245 pg/mL  RBC Folate     Status: None   Collection Time: 02/20/17 11:48 AM  Result Value Ref Range   Folate, Hemolysate 435.3 Not Estab. ng/mL   Folate, RBC 1,036 >498 ng/mL    Assessment:   1. Breakthrough bleeding on OCPs  2. Fatigue  3. Family history of endometriosis  Plan:   Discussed options with patient and mother for treatment of symptoms including increased estrogen, Depo-Provera, hormonal IUDs, Nexplanon, Lupron and [place].   Rx: Estradiol, see orders.   Reviewed red flag symptoms and when to call.   RTC as needed.    Gunnar Bulla, CNM [facility], [facility]

## 2017-02-20 NOTE — Patient Instructions (Addendum)
Medroxyprogesterone injection [Contraceptive] What is this medicine? MEDROXYPROGESTERONE (me DROX ee proe JES te rone) contraceptive injections prevent pregnancy. They provide effective birth control for 3 months. Depo-subQ Provera 104 is also used for treating pain related to endometriosis. This medicine may be used for other purposes; ask your health care provider or pharmacist if you have questions. COMMON BRAND NAME(S): Depo-Provera, Depo-subQ Provera 104 What should I tell my health care provider before I take this medicine? They need to know if you have any of these conditions: -frequently drink alcohol -asthma -blood vessel disease or a history of a blood clot in the lungs or legs -bone disease such as osteoporosis -breast cancer -diabetes -eating disorder (anorexia nervosa or bulimia) -high blood pressure -HIV infection or AIDS -kidney disease -liver disease -mental depression -migraine -seizures (convulsions) -stroke -tobacco smoker -vaginal bleeding -an unusual or allergic reaction to medroxyprogesterone, other hormones, medicines, foods, dyes, or preservatives -pregnant or trying to get pregnant -breast-feeding How should I use this medicine? Depo-Provera Contraceptive injection is given into a muscle. Depo-subQ Provera 104 injection is given under the skin. These injections are given by a health care professional. You must not be pregnant before getting an injection. The injection is usually given during the first 5 days after the start of a menstrual period or 6 weeks after delivery of a baby. Talk to your pediatrician regarding the use of this medicine in children. Special care may be needed. These injections have been used in female children who have started having menstrual periods. Overdosage: If you think you have taken too much of this medicine contact a poison control center or emergency room at once. NOTE: This medicine is only for you. Do not share this medicine  with others. What if I miss a dose? Try not to miss a dose. You must get an injection once every 3 months to maintain birth control. If you cannot keep an appointment, call and reschedule it. If you wait longer than 13 weeks between Depo-Provera contraceptive injections or longer than 14 weeks between Depo-subQ Provera 104 injections, you could get pregnant. Use another method for birth control if you miss your appointment. You may also need a pregnancy test before receiving another injection. What may interact with this medicine? Do not take this medicine with any of the following medications: -bosentan This medicine may also interact with the following medications: -aminoglutethimide -antibiotics or medicines for infections, especially rifampin, rifabutin, rifapentine, and griseofulvin -aprepitant -barbiturate medicines such as phenobarbital or primidone -bexarotene -carbamazepine -medicines for seizures like ethotoin, felbamate, oxcarbazepine, phenytoin, topiramate -modafinil -St. John's wort This list may not describe all possible interactions. Give your health care provider a list of all the medicines, herbs, non-prescription drugs, or dietary supplements you use. Also tell them if you smoke, drink alcohol, or use illegal drugs. Some items may interact with your medicine. What should I watch for while using this medicine? This drug does not protect you against HIV infection (AIDS) or other sexually transmitted diseases. Use of this product may cause you to lose calcium from your bones. Loss of calcium may cause weak bones (osteoporosis). Only use this product for more than 2 years if other forms of birth control are not right for you. The longer you use this product for birth control the more likely you will be at risk for weak bones. Ask your health care professional how you can keep strong bones. You may have a change in bleeding pattern or irregular periods. Many females stop having    periods while taking this drug. If you have received your injections on time, your chance of being pregnant is very low. If you think you may be pregnant, see your health care professional as soon as possible. Tell your health care professional if you want to get pregnant within the next year. The effect of this medicine may last a long time after you get your last injection. What side effects may I notice from receiving this medicine? Side effects that you should report to your doctor or health care professional as soon as possible: -allergic reactions like skin rash, itching or hives, swelling of the face, lips, or tongue -breast tenderness or discharge -breathing problems -changes in vision -depression -feeling faint or lightheaded, falls -fever -pain in the abdomen, chest, groin, or leg -problems with balance, talking, walking -unusually weak or tired -yellowing of the eyes or skin Side effects that usually do not require medical attention (report to your doctor or health care professional if they continue or are bothersome): -acne -fluid retention and swelling -headache -irregular periods, spotting, or absent periods -temporary pain, itching, or skin reaction at site where injected -weight gain This list may not describe all possible side effects. Call your doctor for medical advice about side effects. You may report side effects to FDA at 1-800-FDA-1088. Where should I keep my medicine? This does not apply. The injection will be given to you by a health care professional. NOTE: This sheet is a summary. It may not cover all possible information. If you have questions about this medicine, talk to your doctor, pharmacist, or health care provider.  2018 Elsevier/Gold Standard (2008-01-25 18:37:56) Leuprolide injection What is this medicine? LEUPROLIDE (loo PROE lide) is a man-made hormone. It is used to treat the symptoms of prostate cancer. This medicine may also be used to treat  children with early onset of puberty. It may be used for other hormonal conditions. This medicine may be used for other purposes; ask your health care provider or pharmacist if you have questions. COMMON BRAND NAME(S): Lupron What should I tell my health care provider before I take this medicine? They need to know if you have any of these conditions: -diabetes -heart disease or previous heart attack -high blood pressure -high cholesterol -pain or difficulty passing urine -spinal cord metastasis -stroke -tobacco smoker -an unusual or allergic reaction to leuprolide, benzyl alcohol, other medicines, foods, dyes, or preservatives -pregnant or trying to get pregnant -breast-feeding How should I use this medicine? This medicine is for injection under the skin or into a muscle. You will be taught how to prepare and give this medicine. Use exactly as directed. Take your medicine at regular intervals. Do not take your medicine more often than directed. It is important that you put your used needles and syringes in a special sharps container. Do not put them in a trash can. If you do not have a sharps container, call your pharmacist or healthcare provider to get one. A special MedGuide will be given to you by the pharmacist with each prescription and refill. Be sure to read this information carefully each time. Talk to your pediatrician regarding the use of this medicine in children. While this medicine may be prescribed for children as young as 8 years for selected conditions, precautions do apply. Overdosage: If you think you have taken too much of this medicine contact a poison control center or emergency room at once. NOTE: This medicine is only for you. Do not share this medicine with others.  What if I miss a dose? If you miss a dose, take it as soon as you can. If it is almost time for your next dose, take only that dose. Do not take double or extra doses. What may interact with this  medicine? Do not take this medicine with any of the following medications: -chasteberry This medicine may also interact with the following medications: -herbal or dietary supplements, like black cohosh or DHEA -female hormones, like estrogens or progestins and birth control pills, patches, rings, or injections -female hormones, like testosterone This list may not describe all possible interactions. Give your health care provider a list of all the medicines, herbs, non-prescription drugs, or dietary supplements you use. Also tell them if you smoke, drink alcohol, or use illegal drugs. Some items may interact with your medicine. What should I watch for while using this medicine? Visit your doctor or health care professional for regular checks on your progress. During the first week, your symptoms may get worse, but then will improve as you continue your treatment. You may get hot flashes, increased bone pain, increased difficulty passing urine, or an aggravation of nerve symptoms. Discuss these effects with your doctor or health care professional, some of them may improve with continued use of this medicine. Female patients may experience a menstrual cycle or spotting during the first 2 months of therapy with this medicine. If this continues, contact your doctor or health care professional. What side effects may I notice from receiving this medicine? Side effects that you should report to your doctor or health care professional as soon as possible: -allergic reactions like skin rash, itching or hives, swelling of the face, lips, or tongue -breathing problems -chest pain -depression or memory disorders -pain in your legs or groin -pain at site where injected -severe headache -swelling of the feet and legs -visual changes -vomiting Side effects that usually do not require medical attention (report to your doctor or health care professional if they continue or are bothersome): -breast swelling or  tenderness -decrease in sex drive or performance -diarrhea -hot flashes -loss of appetite -muscle, joint, or bone pains -nausea -redness or irritation at site where injected -skin problems or acne This list may not describe all possible side effects. Call your doctor for medical advice about side effects. You may report side effects to FDA at 1-800-FDA-1088. Where should I keep my medicine? Keep out of the reach of children. Store below 25 degrees C (77 degrees F). Do not freeze. Protect from light. Do not use if it is not clear or if there are particles present. Throw away any unused medicine after the expiration date. NOTE: This sheet is a summary. It may not cover all possible information. If you have questions about this medicine, talk to your doctor, pharmacist, or health care provider.  2018 Elsevier/Gold Standard (2015-08-24 10:54:35) Dyspareunia, Female Dyspareunia is pain that is associated with sexual activity. This can affect any part of the genitals or lower abdomen, and there are many possible causes. This condition ranges from mild to severe. Depending on the cause, dyspareunia may get better with treatment, or it may return (recur) over time. What are the causes? The cause of this condition is not always known. Possible causes include:  Cancer.  Psychological factors, such as depression, anxiety, or previous traumatic experiences.  Severe pain and tenderness of the skin around the vagina (vulva) when it is touched (vulvar vestibulitis syndrome).  Infection of the pelvis or the vulva.  Infection of the  vagina.  Painful, involuntary tightening (contraction) of the vaginal muscles when anything is put inside the vagina (vaginismus).  Allergic reaction.  Ovarian cysts.  Solid growths of tissue (tumors) in the ovaries or the uterus.  Scar tissue in the ovaries, vagina, or pelvis.  Vaginal dryness.  Thinning of the tissue (atrophy) of the vulva and vagina.  Skin  conditions that affect the vulva (vulvar dermatoses), such as lichen sclerosus or lichen planus.  Endometriosis.  Tubal pregnancy.  A tilted uterus.  Uterine prolapse.  Adhesions in the vagina.  Bladder problems.  Intestinal problems.  Certain medicines.  Medical conditions such as diabetes, arthritis, or thyroid disease.  What increases the risk? The following factors may make you more likely to develop this condition:  Having experienced physical or sexual trauma.  Having given birth more than once.  Taking birth control pills.  Having gone through menopause.  Having recently given birth, typically within the past 3-6 months.  Breastfeeding.  What are the signs or symptoms? The main symptom of this condition is pain in any part of the genitals or lower abdomen during or after sexual activity. This may include pain during sexual arousal, genital stimulation, or orgasm. Pain may get worse when anything is inserted into the vagina, or when the genitals are touched in any way, such as when sitting or wearing pants. Pain can range from mild to severe, depending on the cause of the condition. In some cases, symptoms go away with treatment and return (recur) at a later date. How is this diagnosed? This condition may be diagnosed based on:  Your symptoms, including: ? Where your pain is located. ? When your pain occurs.  Your medical history.  A physical exam. This may include a pelvic exam and a Pap test. This is a screening test that is used to check for signs of cancer of the vagina, cervix, and uterus.  Tests, including: ? Blood tests. ? Ultrasound. This uses sound waves to make a picture of the area that is being tested. ? Urine culture. This test involves checking a urine sample for signs of infection. ? Culture test. This is when your health care provider uses a swab to collect a sample of vaginal fluid. The sample is checked for signs of  infection. ? X-rays. ? MRI. ? CT scan. ? Laparoscopy. This is a procedure in which a small incision is made in your lower abdomen and a lighted, pencil-sized instrument (laparoscope) is passed through the incision and used to look inside your pelvis.  You may be referred to a health care provider who specializes in women's health (gynecologist). In some cases, diagnosing the cause of dyspareunia can be difficult. How is this treated? Treatment depends on the cause of your condition and your symptoms. In most cases, you may need to stop sexual activity until your symptoms improve. Treatment may include:  Lubricants.  Kegel exercises or vaginal dilators.  Medicated skin creams.  Medicated vaginal creams.  Hormonal therapy.  Antibiotic medicine to prevent or fight infection.  Medicines that help to relieve pain.  Medicines that treat depression (antidepressants).  Psychological counseling.  Sex therapy.  Surgery.  Follow these instructions at home: Lifestyle  Avoid tight clothing and irritating materials around your genital and abdominal area.  Use water-based lubricants as needed. Avoid oil-based lubricants.  Do not use any products that irritate you. This may include certain condoms, spermicides, lubricants, soaps, tampons, vaginal sprays, or douches.  Always practice safe sex. Talk with your  health care provider about which form of birth control (contraception) is best for you.  Maintain open communication with your sexual partner. General instructions  Take over-the-counter and prescription medicines only as told by your health care provider.  If you had tests done, it is your responsibility to get your tests results. Ask your health care provider or the department performing the test when your results will be ready.  Urinate before you engage in sexual activity.  Consider joining a support group.  Keep all follow-up visits as told by your health care provider.  This is important. Contact a health care provider if:  You develop vaginal bleeding after sexual intercourse.  You develop a lump at the opening of your vagina. Seek medical care even if the lump is painless.  You have: ? Abnormal vaginal discharge. ? Vaginal dryness. ? Itchiness or irritation of your vulva or vagina. ? A new rash. ? Symptoms that get worse or do not improve with treatment. ? A fever. ? Pain when you urinate. ? Blood in your urine. Get help right away if:  You develop severe pain in your abdomen during or shortly after sexual intercourse.  You pass out after having sexual intercourse. This information is not intended to replace advice given to you by your health care provider. Make sure you discuss any questions you have with your health care provider. Document Released: 01/23/2007 Document Revised: 05/15/2015 Document Reviewed: 08/05/2014 Elsevier Interactive Patient Education  2018 Elsevier Inc. Dysmenorrhea Dysmenorrhea means painful cramps during your period (menstrual period). You will have pain in your lower belly (abdomen). The pain is caused by the tightening (contracting) of the muscles of the womb (uterus). The pain may be mild or very bad. With this condition, you may:  Have a headache.  Feel sick to your stomach (nauseous).  Throw up (vomit).  Have lower back pain.  Follow these instructions at home: Helping pain and cramping  Put heat on your lower back or belly when you have pain or cramps. Use the heat source that your doctor tells you to use. ? Place a towel between your skin and the heat. ? Leave the heat on for 20-30 minutes. ? Remove the heat if your skin turns bright red. This is especially important if you cannot feel pain, heat, or cold. ? Do not have a heating pad on during sleep.  Do aerobic exercises. These include walking, swimming, or biking. These may help with cramps.  Massage your lower back or belly. This may help lessen  pain. General instructions  Take over-the-counter and prescription medicines only as told by your doctor.  Do not drive or use heavy machinery while taking prescription pain medicine.  Avoid alcohol and caffeine during and right before your period. These can make cramps worse.  Do not use any products that have nicotine or tobacco. These include cigarettes and e-cigarettes. If you need help quitting, ask your doctor.  Keep all follow-up visits as told by your doctor. This is important. Contact a doctor if:  You have pain that gets worse.  You have pain that does not get better with medicine.  You have pain during sex.  You feel sick to your stomach or you throw up during your period, and medicine does not help. Get help right away if:  You pass out (faint). Summary  Dysmenorrhea means painful cramps during your period (menstrual period).  Put heat on your lower back or belly when you have pain or cramps.  Do  exercises like walking, swimming, or biking to help with cramps.  Contact a doctor if you have pain during sex. This information is not intended to replace advice given to you by your health care provider. Make sure you discuss any questions you have with your health care provider. Document Released: 04/01/2008 Document Revised: 01/21/2016 Document Reviewed: 01/21/2016 Elsevier Interactive Patient Education  2017 [employer]

## 2017-02-21 ENCOUNTER — Encounter: Payer: 59 | Admitting: Certified Nurse Midwife

## 2017-02-21 LAB — CBC
HEMATOCRIT: 42 % (ref 34.0–46.6)
HEMOGLOBIN: 14.6 g/dL (ref 11.1–15.9)
MCH: 31.2 pg (ref 26.6–33.0)
MCHC: 34.8 g/dL (ref 31.5–35.7)
MCV: 90 fL (ref 79–97)
Platelets: 322 10*3/uL (ref 150–379)
RBC: 4.68 x10E6/uL (ref 3.77–5.28)
RDW: 13.2 % (ref 12.3–15.4)
WBC: 6.6 10*3/uL (ref 3.4–10.8)

## 2017-02-21 LAB — VITAMIN B12: VITAMIN B 12: 431 pg/mL (ref 232–1245)

## 2017-02-21 LAB — FOLATE RBC
Folate, Hemolysate: 435.3 ng/mL
Folate, RBC: 1036 ng/mL (ref >498)

## 2017-02-21 LAB — THYROID PANEL WITH TSH
Free Thyroxine Index: 1.8 (ref 1.2–4.9)
T3 UPTAKE RATIO: 22 % — AB (ref 24–39)
T4 TOTAL: 8.2 ug/dL (ref 4.5–12.0)
TSH: 2.32 u[IU]/mL (ref 0.450–4.500)

## 2017-02-21 LAB — FERRITIN: FERRITIN: 149 ng/mL (ref 15–150)

## 2017-02-22 ENCOUNTER — Encounter: Payer: Self-pay | Admitting: Certified Nurse Midwife

## 2017-02-27 ENCOUNTER — Encounter: Payer: Self-pay | Admitting: Certified Nurse Midwife

## 2017-03-03 ENCOUNTER — Other Ambulatory Visit: Payer: Self-pay

## 2017-03-03 MED ORDER — ELAGOLIX SODIUM 150 MG PO TABS: 150.0000 mg | ORAL_TABLET | Freq: Every day | ORAL | 6 refills | Status: DC

## 2017-03-03 NOTE — Telephone Encounter (Signed)
Patient may have Orilissa 150 mg PO daily with refills. Pick up saving card from office or visit website. Helpline available from 9a-9p. Thanks, JML

## 2017-03-07 ENCOUNTER — Telehealth: Payer: Self-pay | Admitting: Certified Nurse Midwife

## 2017-03-07 NOTE — Telephone Encounter (Signed)
The patient called and stated that she is not able to pick up her medication from her pharmacy because her provider needs to sign off on it. No other information was disclosed. Please advise.

## 2017-03-07 NOTE — Telephone Encounter (Signed)
PA complete. Insurance has 72h to process. Will contact pt  asap. Pt aware per vm.

## 2017-03-10 NOTE — Telephone Encounter (Signed)
LM to contact office. Insurance denied [place].

## 2017-03-13 ENCOUNTER — Telehealth: Payer: Self-pay | Admitting: Certified Nurse Midwife

## 2017-03-13 ENCOUNTER — Telehealth: Payer: Self-pay

## 2017-03-13 NOTE — Telephone Encounter (Signed)
Please let patient know that I am in the process of writing a medical necessity letter to her insurance company in hopes that they will reverse their decision. Will update as soon as I know something new. JML

## 2017-03-13 NOTE — Telephone Encounter (Signed)
The patient called and stated that she is having problem with her medication being denied at her pharmacy. The patient would like to speak with Marcelino DusterMichelle or a nurse. Please advise.

## 2017-03-13 NOTE — Telephone Encounter (Signed)
mychart message sent

## 2017-03-14 NOTE — Telephone Encounter (Signed)
See my chart message. JML to write medical necessity letter.

## 2017-03-17 ENCOUNTER — Encounter: Payer: Self-pay | Admitting: Certified Nurse Midwife

## 2017-03-17 ENCOUNTER — Telehealth: Payer: Self-pay | Admitting: Certified Nurse Midwife

## 2017-03-17 NOTE — Telephone Encounter (Signed)
The patient called and stated that she would like for Marcelino DusterMichelle to give her a call back, The patient has some questions for her. Please advise.

## 2017-03-19 ENCOUNTER — Other Ambulatory Visit: Payer: Self-pay | Admitting: Certified Nurse Midwife

## 2017-03-20 NOTE — Telephone Encounter (Signed)
Mary Parsons spoke with pt.

## 2017-08-31 ENCOUNTER — Encounter: Payer: Self-pay | Admitting: Obstetrics and Gynecology

## 2017-08-31 ENCOUNTER — Ambulatory Visit (INDEPENDENT_AMBULATORY_CARE_PROVIDER_SITE_OTHER): Payer: 59 | Admitting: Obstetrics and Gynecology

## 2017-08-31 VITALS — BP 128/73 | HR 92 | Wt 170.3 lb

## 2017-08-31 DIAGNOSIS — O09299 Supervision of pregnancy with other poor reproductive or obstetric history, unspecified trimester: Secondary | ICD-10-CM

## 2017-08-31 NOTE — Progress Notes (Signed)
Pt is here for confirmation, LMP 07/22/17, EDD 04/28/2018, pt is having same symptoms as last time she had miscarriage

## 2017-09-01 LAB — BETA HCG QUANT (REF LAB): HCG QUANT: 15399 m[IU]/mL

## 2017-09-04 ENCOUNTER — Other Ambulatory Visit: Payer: 59

## 2017-09-04 DIAGNOSIS — O09299 Supervision of pregnancy with other poor reproductive or obstetric history, unspecified trimester: Secondary | ICD-10-CM

## 2017-09-05 ENCOUNTER — Other Ambulatory Visit: Payer: Self-pay | Admitting: Obstetrics and Gynecology

## 2017-09-05 ENCOUNTER — Telehealth: Payer: Self-pay | Admitting: Obstetrics and Gynecology

## 2017-09-05 DIAGNOSIS — N926 Irregular menstruation, unspecified: Secondary | ICD-10-CM

## 2017-09-05 DIAGNOSIS — Z3201 Encounter for pregnancy test, result positive: Secondary | ICD-10-CM

## 2017-09-05 LAB — BETA HCG QUANT (REF LAB): hCG Quant: 37291 m[IU]/mL

## 2017-09-05 NOTE — Telephone Encounter (Signed)
Patient called requesting beta results from yesterday. Thanks

## 2017-09-05 NOTE — Telephone Encounter (Signed)
Pt is coming in 09/06/17 for us & beta

## 2017-09-06 ENCOUNTER — Other Ambulatory Visit: Payer: 59

## 2017-09-06 ENCOUNTER — Ambulatory Visit (INDEPENDENT_AMBULATORY_CARE_PROVIDER_SITE_OTHER): Payer: 59

## 2017-09-06 DIAGNOSIS — N926 Irregular menstruation, unspecified: Secondary | ICD-10-CM

## 2017-09-06 DIAGNOSIS — Z3201 Encounter for pregnancy test, result positive: Secondary | ICD-10-CM

## 2017-09-06 DIAGNOSIS — O09299 Supervision of pregnancy with other poor reproductive or obstetric history, unspecified trimester: Secondary | ICD-10-CM

## 2017-09-06 DIAGNOSIS — O3411 Maternal care for benign tumor of corpus uteri, first trimester: Secondary | ICD-10-CM | POA: Diagnosis not present

## 2017-09-06 DIAGNOSIS — N8311 Corpus luteum cyst of right ovary: Secondary | ICD-10-CM | POA: Diagnosis not present

## 2017-09-06 DIAGNOSIS — Z3A01 Less than 8 weeks gestation of pregnancy: Secondary | ICD-10-CM | POA: Diagnosis not present

## 2017-09-07 LAB — BETA HCG QUANT (REF LAB): hCG Quant: 46494 m[IU]/mL

## 2017-09-08 ENCOUNTER — Encounter: Payer: 59 | Admitting: Certified Nurse Midwife

## 2017-09-11 ENCOUNTER — Encounter: Payer: Self-pay | Admitting: Certified Nurse Midwife

## 2017-09-22 ENCOUNTER — Other Ambulatory Visit (INDEPENDENT_AMBULATORY_CARE_PROVIDER_SITE_OTHER): Payer: 59

## 2017-09-22 ENCOUNTER — Other Ambulatory Visit: Payer: Self-pay | Admitting: Certified Nurse Midwife

## 2017-09-22 ENCOUNTER — Encounter: Payer: Self-pay | Admitting: Certified Nurse Midwife

## 2017-09-22 ENCOUNTER — Ambulatory Visit (INDEPENDENT_AMBULATORY_CARE_PROVIDER_SITE_OTHER): Payer: 59 | Admitting: Certified Nurse Midwife

## 2017-09-22 DIAGNOSIS — N8311 Corpus luteum cyst of right ovary: Secondary | ICD-10-CM | POA: Diagnosis not present

## 2017-09-22 DIAGNOSIS — O3680X0 Pregnancy with inconclusive fetal viability, not applicable or unspecified: Secondary | ICD-10-CM

## 2017-09-22 DIAGNOSIS — Z3A08 8 weeks gestation of pregnancy: Secondary | ICD-10-CM

## 2017-09-22 DIAGNOSIS — O3411 Maternal care for benign tumor of corpus uteri, first trimester: Secondary | ICD-10-CM

## 2017-09-22 NOTE — Patient Instructions (Signed)
Common Medications Safe in Pregnancy  Acne:      Constipation:  Benzoyl Peroxide     Colace  Clindamycin      Dulcolax Suppository  Topica Erythromycin     Fibercon  Salicylic Acid      Metamucil         Miralax AVOID:        Senakot   Accutane    Cough:  Retin-A       Cough Drops  Tetracycline      Phenergan w/ Codeine if Rx  Minocycline      Robitussin (Plain & DM)  Antibiotics:     Crabs/Lice:  Ceclor       RID  Cephalosporins    AVOID:  E-Mycins      Kwell  Keflex  Macrobid/Macrodantin   Diarrhea:  Penicillin      Kao-Pectate  Zithromax      Imodium AD         PUSH FLUIDS AVOID:       Cipro     Fever:  Tetracycline      Tylenol (Regular or Extra  Minocycline       Strength)  Levaquin      Extra Strength-Do not          Exceed 8 tabs/24 hrs Caffeine:        <200mg/day (equiv. To 1 cup of coffee or  approx. 3 12 oz sodas)         Gas: Cold/Hayfever:       Gas-X  Benadryl      Mylicon  Claritin       Phazyme  **Claritin-D        Chlor-Trimeton    Headaches:  Dimetapp      ASA-Free Excedrin  Drixoral-Non-Drowsy     Cold Compress  Mucinex (Guaifenasin)     Tylenol (Regular or Extra  Sudafed/Sudafed-12 Hour     Strength)  **Sudafed PE Pseudoephedrine   Tylenol Cold & Sinus     Vicks Vapor Rub  Zyrtec  **AVOID if Problems With Blood Pressure         Heartburn: Avoid lying down for at least 1 hour after meals  Aciphex      Maalox     Rash:  Milk of Magnesia     Benadryl    Mylanta       1% Hydrocortisone Cream  Pepcid  Pepcid Complete   Sleep Aids:  Prevacid      Ambien   Prilosec       Benadryl  Rolaids       Chamomile Tea  Tums (Limit 4/day)     Unisom  Zantac       Tylenol PM         Warm milk-add vanilla or  Hemorrhoids:       Sugar for taste  Anusol/Anusol H.C.  (RX: Analapram 2.5%)  Sugar Substitutes:  Hydrocortisone OTC     Ok in moderation  Preparation H      Tucks        Vaseline lotion applied to tissue with  wiping    Herpes:     Throat:  Acyclovir      Oragel  Famvir  Valtrex     Vaccines:         Flu Shot Leg Cramps:       *Gardasil  Benadryl      Hepatitis A         Hepatitis B Nasal Spray:         Pneumovax  Saline Nasal Spray     Polio Booster         Tetanus Nausea:       Tuberculosis test or PPD  Vitamin B6 25 mg TID   AVOID:    Dramamine      *Gardasil  Emetrol       Live Poliovirus  Ginger Root 250 mg QID    MMR (measles, mumps &  High Complex Carbs @ Bedtime    rebella)  Sea Bands-Accupressure    Varicella (Chickenpox)  Unisom 1/2 tab TID     *No known complications           If received before Pain:         Known pregnancy;   Darvocet       Resume series after  Lortab        Delivery  Percocet    Yeast:   Tramadol      Femstat  Tylenol 3      Gyne-lotrimin  Ultram       Monistat  Vicodin           MISC:         All Sunscreens           Hair Coloring/highlights          Insect Repellant's          (Including DEET)         Mystic Tans First Trimester of Pregnancy The first trimester of pregnancy is from week 1 until the end of week 13 (months 1 through 3). During this time, your baby will begin to develop inside you. At 6-8 weeks, the eyes and face are formed, and the heartbeat can be seen on ultrasound. At the end of 12 weeks, all the baby's organs are formed. Prenatal care is all the medical care you receive before the birth of your baby. Make sure you get good prenatal care and follow all of your doctor's instructions. Follow these instructions at home: Medicines  Take over-the-counter and prescription medicines only as told by your doctor. Some medicines are safe and some medicines are not safe during pregnancy.  Take a prenatal vitamin that contains at least 600 micrograms (mcg) of folic acid.  If you have trouble pooping (constipation), take medicine that will make your stool soft (stool softener) if your doctor approves. Eating and drinking  Eat regular,  healthy meals.  Your doctor will tell you the amount of weight gain that is right for you.  Avoid raw meat and uncooked cheese.  If you feel sick to your stomach (nauseous) or throw up (vomit): ? Eat 4 or 5 small meals a day instead of 3 large meals. ? Try eating a few soda crackers. ? Drink liquids between meals instead of during meals.  To prevent constipation: ? Eat foods that are high in fiber, like fresh fruits and vegetables, whole grains, and beans. ? Drink enough fluids to keep your pee (urine) clear or pale yellow. Activity  Exercise only as told by your doctor. Stop exercising if you have cramps or pain in your lower belly (abdomen) or low back.  Do not exercise if it is too hot, too humid, or if you are in a place of great height (high altitude).  Try to avoid standing for long periods of time. Move your legs often if you must stand in one place for a long time.  Avoid heavy lifting.  Wear low-heeled shoes. Sit and stand  up straight.  You can have sex unless your doctor tells you not to. Relieving pain and discomfort  Wear a good support bra if your breasts are sore.  Take warm water baths (sitz baths) to soothe pain or discomfort caused by hemorrhoids. Use hemorrhoid cream if your doctor says it is okay.  Rest with your legs raised if you have leg cramps or low back pain.  If you have puffy, bulging veins (varicose veins) in your legs: ? Wear support hose or compression stockings as told by your doctor. ? Raise (elevate) your feet for 15 minutes, 3-4 times a day. ? Limit salt in your food. Prenatal care  Schedule your prenatal visits by the twelfth week of pregnancy.  Write down your questions. Take them to your prenatal visits.  Keep all your prenatal visits as told by your doctor. This is important. Safety  Wear your seat belt at all times when driving.  Make a list of emergency phone numbers. The list should include numbers for family, friends, the  hospital, and police and fire departments. General instructions  Ask your doctor for a referral to a local prenatal class. Begin classes no later than at the start of month 6 of your pregnancy.  Ask for help if you need counseling or if you need help with nutrition. Your doctor can give you advice or tell you where to go for help.  Do not use hot tubs, steam rooms, or saunas.  Do not douche or use tampons or scented sanitary pads.  Do not cross your legs for long periods of time.  Avoid all herbs and alcohol. Avoid drugs that are not approved by your doctor.  Do not use any tobacco products, including cigarettes, chewing tobacco, and electronic cigarettes. If you need help quitting, ask your doctor. You may get counseling or other support to help you quit.  Avoid cat litter boxes and soil used by cats. These carry germs that can cause birth defects in the baby and can cause a loss of your baby (miscarriage) or stillbirth.  Visit your dentist. At home, brush your teeth with a soft toothbrush. Be gentle when you floss. Contact a doctor if:  You are dizzy.  You have mild cramps or pressure in your lower belly.  You have a nagging pain in your belly area.  You continue to feel sick to your stomach, you throw up, or you have watery poop (diarrhea).  You have a bad smelling fluid coming from your vagina.  You have pain when you pee (urinate).  You have increased puffiness (swelling) in your face, hands, legs, or ankles. Get help right away if:  You have a fever.  You are leaking fluid from your vagina.  You have spotting or bleeding from your vagina.  You have very bad belly cramping or pain.  You gain or lose weight rapidly.  You throw up blood. It may look like coffee grounds.  You are around people who have [place] measles, fifth disease, or chickenpox.  You have a very bad headache.  You have shortness of breath.  You have any kind of trauma, such as from a fall or  a car accident. Summary  The first trimester of pregnancy is from week 1 until the end of week 13 (months 1 through 3).  To take care of yourself and your unborn baby, you will need to eat healthy meals, take medicines only if your doctor tells you to do so, and do activities that  are safe for you and your baby.  Keep all follow-up visits as told by your doctor. This is important as your doctor will have to ensure that your baby is healthy and growing well. This information is not intended to replace advice given to you by your health care provider. Make sure you discuss any questions you have with your health care provider. Document Released: 06/22/2007 Document Revised: 01/12/2016 Document Reviewed: 01/12/2016 Elsevier Interactive Patient Education  2017 [employer].

## 2017-09-22 NOTE — Progress Notes (Signed)
Mary Parsons presents for NOB nurse interview visit. Pregnancy confirmation done here @ [facility].  G- 3.  P-1    . Pregnancy education material explained and given. _No cats in the home. NOB labs ordered. (TSH/HbgA1c due to Increased BMI), (sickle cell). HIV labs and Drug screen were explained optional and she did not decline. Drug screen ordered/declined. PNV encouraged. Genetic screening options discussed. Genetic testing: Ordered/Declined/Unsure.  Pt may discuss with provider. Pt. To follow up with provider in __ weeks for NOB physical.  All questions answered.

## 2017-09-23 LAB — BETA HCG QUANT (REF LAB): hCG Quant: 127011 m[IU]/mL

## 2017-09-23 LAB — GC/CHLAMYDIA PROBE AMP
CHLAMYDIA, DNA PROBE: NEGATIVE
Neisseria gonorrhoeae by PCR: NEGATIVE

## 2017-09-23 LAB — URINALYSIS, ROUTINE W REFLEX MICROSCOPIC
BILIRUBIN UA: NEGATIVE
GLUCOSE, UA: NEGATIVE
Leukocytes, UA: NEGATIVE
Nitrite, UA: NEGATIVE
PROTEIN UA: NEGATIVE
RBC UA: NEGATIVE
UUROB: 0.2 mg/dL (ref 0.2–1.0)
pH, UA: 5.5 (ref 5.0–7.5)

## 2017-09-23 LAB — HIV ANTIBODY (ROUTINE TESTING W REFLEX): HIV Screen 4th Generation wRfx: NONREACTIVE

## 2017-09-23 LAB — ABO AND RH: Rh Factor: POSITIVE

## 2017-09-23 LAB — RPR: RPR: NONREACTIVE

## 2017-09-23 LAB — HEPATITIS B SURFACE ANTIGEN: Hepatitis B Surface Ag: NEGATIVE

## 2017-09-23 LAB — ANTIBODY SCREEN: Antibody Screen: NEGATIVE

## 2017-09-23 LAB — RUBELLA SCREEN: RUBELLA: 1.55 {index} (ref >0.99)

## 2017-09-23 LAB — VARICELLA ZOSTER ANTIBODY, IGG: VARICELLA: 1048 {index} (ref >165)

## 2017-09-24 LAB — URINE CULTURE

## 2017-09-24 NOTE — Progress Notes (Signed)
I have reviewed the record and concur with patient management and plan of care.    Diontre Harps Michelle Susana Duell, CNM [facility], [facility] 

## 2017-10-09 ENCOUNTER — Ambulatory Visit (INDEPENDENT_AMBULATORY_CARE_PROVIDER_SITE_OTHER): Payer: 59 | Admitting: Certified Nurse Midwife

## 2017-10-09 ENCOUNTER — Encounter: Payer: Self-pay | Admitting: Certified Nurse Midwife

## 2017-10-09 VITALS — BP 120/81 | HR 86 | Wt 162.0 lb

## 2017-10-09 DIAGNOSIS — Z3A11 11 weeks gestation of pregnancy: Secondary | ICD-10-CM

## 2017-10-09 DIAGNOSIS — Z98891 History of uterine scar from previous surgery: Secondary | ICD-10-CM | POA: Insufficient documentation

## 2017-10-09 DIAGNOSIS — O09291 Supervision of pregnancy with other poor reproductive or obstetric history, first trimester: Secondary | ICD-10-CM

## 2017-10-09 DIAGNOSIS — Z8759 Personal history of other complications of pregnancy, childbirth and the puerperium: Secondary | ICD-10-CM

## 2017-10-09 DIAGNOSIS — Z3491 Encounter for supervision of normal pregnancy, unspecified, first trimester: Secondary | ICD-10-CM

## 2017-10-09 DIAGNOSIS — O34219 Maternal care for unspecified type scar from previous cesarean delivery: Secondary | ICD-10-CM

## 2017-10-09 LAB — POCT URINALYSIS DIPSTICK OB
GLUCOSE, UA: NEGATIVE
KETONES UA: NEGATIVE
LEUKOCYTES UA: NEGATIVE
Nitrite, UA: NEGATIVE
RBC UA: NEGATIVE
SPEC GRAV UA: 1.01 (ref 1.010–1.025)
Urobilinogen, UA: 0.2 E.U./dL
pH, UA: 6.5 (ref 5.0–8.0)

## 2017-10-09 MED ORDER — LOPERAMIDE HCL 2 MG PO CAPS: 2.0000 mg | ORAL_CAPSULE | ORAL | 0 refills | Status: DC | PRN

## 2017-10-09 NOTE — Progress Notes (Signed)
NEW OB HISTORY AND PHYSICAL  SUBJECTIVE:       Mary Parsons is a 27 y.o. [id number] female, Patient's last menstrual period was 07/22/2017., Estimated Date of Delivery: 04/28/18, [redacted]w[redacted]d, presents today for establishment of Prenatal Care.  She has no unusual complaints. Endorses nausea without vomiting. Patient is anxious due to history of first trimester loss.   Grandmother diagnosed with cancer with possibility of c-diff infection. Reports diarrhea for the last few days.   Accompanied by mother.   Denies difficulty breathing or respiratory distress, chest pain, abdominal pain, vaginal bleeding, dysuria, and leg pain or swelling.    Gynecologic History  Patient's last menstrual period was 07/22/2017.   Contraception: none  Last Pap: due. Requests postpartum pap.   Obstetric History  OB History  Gravida Para Term Preterm AB Living  3 1 1   1 1   SAB TAB Ectopic Multiple Live Births  1       1    # Outcome Date GA Lbr Len/2nd Weight Sex Delivery Anes PTL Lv  3 Current           2 SAB 11/18/16 [redacted]w[redacted]d         1 Term 08/21/15 [redacted]w[redacted]d  4 lb 2.2 oz (1.878 kg) M CS-Unspec   LIV     Complications: Fetal Intolerance    Past Medical History:  Diagnosis Date  . Heavy periods     Past Surgical History:  Procedure Laterality Date  . CESAREAN SECTION    . DILATION AND EVACUATION N/A 11/18/2016   Procedure: DILATATION AND EVACUATION;  Surgeon: Hildred Laser, MD;  Location: [facility] ORS;  Service: Gynecology;  Laterality: N/A;  . LAPAROSCOPY N/A 11/18/2016   Procedure: LAPAROSCOPY DIAGNOSTIC;  Surgeon: Hildred Laser, MD;  Location: [facility] ORS;  Service: Gynecology;  Laterality: N/A;  . TONSILLECTOMY      Current Outpatient Medications on File Prior to Visit  Medication Sig Dispense Refill  . Prenatal Vit-Fe Fumarate-FA (PRENATAL MULTIVITAMIN) TABS tablet Take 1 tablet by mouth daily at 12 noon.    Marland Kitchen acetaminophen (TYLENOL) 325 MG tablet Take 650 mg by mouth every 6 (six) hours as needed (for  pain.).    Marland Kitchen Elagolix Sodium (ORILISSA) 150 MG TABS Take 150 mg by mouth daily. (Patient not taking: Reported on 08/31/2017) 30 tablet 6  . estradiol (ESTRACE) 1 MG tablet Take 1 tablet (1 mg total) by mouth daily. (Patient not taking: Reported on 08/31/2017) 30 tablet 0  . ibuprofen (ADVIL,MOTRIN) 800 MG tablet Take 1 tablet (800 mg total) by mouth every 8 (eight) hours as needed. (Patient not taking: Reported on 08/31/2017) 60 tablet 1  . levonorgestrel-ethinyl estradiol (SEASONALE,INTROVALE,JOLESSA) 0.15-0.03 MG tablet Take 1 tablet by mouth daily. (Patient not taking: Reported on 08/31/2017) 1 Package 4   No current facility-administered medications on file prior to visit.     Allergies  Allergen Reactions  . Adhesive [Tape] Other (See Comments)    Burns skin off--tolerates PAPER TAPE ONLY    Social History   Socioeconomic History  . Marital status: Married    Spouse name: Not on file  . Number of children: Not on file  . Years of education: Not on file  . Highest education level: Not on file  Occupational History  . Not on file  Social Needs  . Financial resource strain: Not on file  . Food insecurity:    Worry: Not on file    Inability: Not on file  . Transportation needs:  Medical: Not on file    Non-medical: Not on file  Tobacco Use  . Smoking status: Never Smoker  . Smokeless tobacco: Never Used  Substance and Sexual Activity  . Alcohol use: Yes    Comment: occas  . Drug use: No  . Sexual activity: Yes    Birth control/protection: None  Lifestyle  . Physical activity:    Days per week: Not on file    Minutes per session: Not on file  . Stress: Not on file  Relationships  . Social connections:    Talks on phone: Not on file    Gets together: Not on file    Attends religious service: Not on file    Active member of club or organization: Not on file    Attends meetings of clubs or organizations: Not on file    Relationship status: Not on file  . Intimate  partner violence:    Fear of current or ex partner: Not on file    Emotionally abused: Not on file    Physically abused: Not on file    Forced sexual activity: Not on file  Other Topics Concern  . Not on file  Social History Narrative  . Not on file    Family History  Problem Relation Age of Onset  . Endometriosis Mother   . Endometriosis Maternal Aunt   . Endometriosis Maternal Grandmother     The following portions of the patient's history were reviewed and updated as appropriate: allergies, current medications, past OB history, past medical history, past surgical history, past family history, past social history, and problem list.   OBJECTIVE:  BP 120/81   Pulse 86   Wt 162 lb (73.5 kg)   LMP 07/22/2017   BMI 29.63 kg/m   Initial Physical Exam (New OB)  GENERAL APPEARANCE: alert, well appearing, in no apparent distress  HEAD: normocephalic, atraumatic  MOUTH: mucous membranes moist, pharynx normal without lesions and dental hygiene good  THYROID: no thyromegaly or masses present  BREASTS: patient declined exam  LUNGS: clear to auscultation, no wheezes, rales or rhonchi, symmetric air entry  HEART: regular rate and rhythm, no murmurs  ABDOMEN: soft, nontender, nondistended, no abnormal masses, no epigastric pain and FHT present  EXTREMITIES: no redness or tenderness in the calves or thighs, no edema  SKIN: normal coloration and turgor, no rashes  LYMPH NODES: no adenopathy palpable  NEUROLOGIC: alert, oriented, normal speech, no focal findings or movement disorder noted  PELVIC EXAM: not indicated  ASSESSMENT: Normal pregnancy History of miscarriage, currently pregnant Previous cesarean section-desires repeat  PLAN: Prenatal care New OB counseling: The patient has been given an overview regarding routine prenatal care. Recommendations regarding diet, weight gain, and exercise in pregnancy were given. Prenatal testing, optional genetic testing, and  ultrasound use in pregnancy were reviewed.  Benefits of Breast Feeding were discussed. The patient is encouraged to consider nursing her baby post partum. See orders   Gunnar BullaJenkins Michelle Raschelle Wisenbaker, CNM [facility]

## 2017-10-09 NOTE — Progress Notes (Signed)
NOB PE- pts grandmother has cancer- they are testing her for c-diff, pt has been very nauseous, and she is having some diarrhea x 3 days, "states its like water", then she gets real crampy

## 2017-10-09 NOTE — Patient Instructions (Signed)
Back Pain in Pregnancy Back pain during pregnancy is common. Back pain may be caused by several factors that are related to changes during your pregnancy. Follow these instructions at home: Managing pain, stiffness, and swelling  If directed, apply ice for sudden (acute) back pain. ? Put ice in a plastic bag. ? Place a towel between your skin and the bag. ? Leave the ice on for 20 minutes, 2-3 times per day.  If directed, apply heat to the affected area before you exercise: ? Place a towel between your skin and the heat pack or heating pad. ? Leave the heat on for 20-30 minutes. ? Remove the heat if your skin turns bright red. This is especially important if you are unable to feel pain, heat, or cold. You may have a greater risk of getting burned. Activity  Exercise as told by your health care provider. Exercising is the best way to prevent or manage back pain.  Listen to your body when lifting. If lifting hurts, ask for help or bend your knees. This uses your leg muscles instead of your back muscles.  Squat down when picking up something from the floor. Do not bend over.  Only use bed rest as told by your health care provider. Bed rest should only be used for the most severe episodes of back pain. Standing, Sitting, and Lying Down  Do not stand in one place for long periods of time.  Use good posture when sitting. Make sure your head rests over your shoulders and is not hanging forward. Use a pillow on your lower back if necessary.  Try sleeping on your side, preferably the left side, with a pillow or two between your legs. If you are sore after a night's rest, your bed may be too soft. A firm mattress may provide more support for your back during pregnancy. General instructions  Do not wear high heels.  Eat a healthy diet. Try to gain weight within your health care provider's recommendations.  Use a maternity girdle, elastic sling, or back brace as told by your health care  provider.  Take over-the-counter and prescription medicines only as told by your health care provider.  Keep all follow-up visits as told by your health care provider. This is important. This includes any visits with any specialists, such as a physical therapist. Contact a health care provider if:  Your back pain interferes with your daily activities.  You have increasing pain in other parts of your body. Get help right away if:  You develop numbness, tingling, weakness, or problems with the use of your arms or legs.  You develop severe back pain that is not controlled with medicine.  You have a sudden change in bowel or bladder control.  You develop shortness of breath, dizziness, or you faint.  You develop nausea, vomiting, or sweating.  You have back pain that is a rhythmic, cramping pain similar to labor pains. Labor pain is usually 1-2 minutes apart, lasts for about 1 minute, and involves a bearing down feeling or pressure in your pelvis.  You have back pain and your water breaks or you have vaginal bleeding.  You have back pain or numbness that travels down your leg.  Your back pain developed after you fell.  You develop pain on one side of your back.  You see blood in your urine.  You develop skin blisters in the area of your back pain. This information is not intended to replace advice given to  you by your health care provider. Make sure you discuss any questions you have with your health care provider. Document Released: 04/13/2005 Document Revised: 06/11/2015 Document Reviewed: 09/17/2014 Elsevier Interactive Patient Education  2018 [employer]. Round Ligament Pain The round ligament is a cord of muscle and tissue that helps to support the uterus. It can become a source of pain during pregnancy if it becomes stretched or twisted as the baby grows. The pain usually begins in the second trimester of pregnancy, and it can come and go until the baby is delivered. It is  not a serious problem, and it does not cause harm to the baby. Round ligament pain is usually a short, sharp, and pinching pain, but it can also be a dull, lingering, and aching pain. The pain is felt in the lower side of the abdomen or in the groin. It usually starts deep in the groin and moves up to the outside of the hip area. Pain can occur with:  A sudden change in position.  Rolling over in bed.  Coughing or sneezing.  Physical activity.  Follow these instructions at home: Watch your condition for any changes. Take these steps to help with your pain:  When the pain starts, relax. Then try: ? Sitting down. ? Flexing your knees up to your abdomen. ? Lying on your side with one pillow under your abdomen and another pillow between your legs. ? Sitting in a warm bath for 15-20 minutes or until the pain goes away.  Take over-the-counter and prescription medicines only as told by your health care provider.  Move slowly when you sit and stand.  Avoid long walks if they cause pain.  Stop or lessen your physical activities if they cause pain.  Contact a health care provider if:  Your pain does not go away with treatment.  You feel pain in your back that you did not have before.  Your medicine is not helping. Get help right away if:  You develop a fever or chills.  You develop uterine contractions.  You develop vaginal bleeding.  You develop nausea or vomiting.  You develop diarrhea.  You have pain when you urinate. This information is not intended to replace advice given to you by your health care provider. Make sure you discuss any questions you have with your health care provider. Document Released: 10/13/2007 Document Revised: 06/11/2015 Document Reviewed: 03/12/2014 Elsevier Interactive Patient Education  2018 [place] for Pregnant Women While you are pregnant, your body will require additional nutrition to help support your growing baby. It is  recommended that you consume:  150 additional calories each day during your first trimester.  300 additional calories each day during your second trimester.  300 additional calories each day during your third trimester.  Eating a healthy, well-balanced diet is very important for your health and for your baby's health. You also have a higher need for some vitamins and minerals, such as folic acid, calcium, iron, and vitamin D. What do I need to know about eating during pregnancy?  Do not try to lose weight or go on a diet during pregnancy.  Choose healthy, nutritious foods. Choose  of a sandwich with a glass of milk instead of a candy bar or a high-calorie sugar-sweetened beverage.  Limit your overall intake of foods that have "empty calories." These are foods that have little nutritional value, such as sweets, desserts, candies, sugar-sweetened beverages, and fried foods.  Eat a variety of foods, especially fruits  and vegetables.  Take a prenatal vitamin to help meet the additional needs during pregnancy, specifically for folic acid, iron, calcium, and vitamin D.  Remember to stay active. Ask your health care provider for exercise recommendations that are specific to you.  Practice good food safety and cleanliness, such as washing your hands before you eat and after you prepare raw meat. This helps to prevent foodborne illnesses, such as listeriosis, that can be very dangerous for your baby. Ask your health care provider for more information about listeriosis. What does 150 extra calories look like? Healthy options for an additional 150 calories each day could be any of the following:  Plain low-fat yogurt (6-8 oz) with  cup of berries.  1 apple with 2 teaspoons of peanut butter.  Cut-up vegetables with  cup of hummus.  Low-fat chocolate milk (8 oz or 1 cup).  1 string cheese with 1 medium orange.   of a peanut butter and jelly sandwich on whole-wheat bread (1 tsp of peanut  butter).  For 300 calories, you could eat two of those healthy options each day. What is a healthy amount of weight to gain? The recommended amount of weight for you to gain is based on your pre-pregnancy BMI. If your pre-pregnancy BMI was:  Less than 18 (underweight), you should gain 28-40 lb.  18-24.9 (normal), you should gain 25-35 lb.  25-29.9 (overweight), you should gain 15-25 lb.  Greater than 30 (obese), you should gain 11-20 lb.  What if I am having twins or multiples? Generally, pregnant women who will be having twins or multiples may need to increase their daily calories by 300-600 calories each day. The recommended range for total weight gain is 25-54 lb, depending on your pre-pregnancy BMI. Talk with your health care provider for specific guidance about additional nutritional needs, weight gain, and exercise during your pregnancy. What foods can I eat? Grains Any grains. Try to choose whole grains, such as whole-wheat bread, oatmeal, or brown rice. Vegetables Any vegetables. Try to eat a variety of colors and types of vegetables to get a full range of vitamins and minerals. Remember to wash your vegetables well before eating. Fruits Any fruits. Try to eat a variety of colors and types of fruit to get a full range of vitamins and minerals. Remember to wash your fruits well before eating. Meats and Other Protein Sources Lean meats, including chicken, [place], fish, and lean cuts of beef, veal, or pork. Make sure that all meats are cooked to "well done." Tofu. Tempeh. Beans. Eggs. Peanut butter and other nut butters. Seafood, such as shrimp, crab, and lobster. If you choose fish, select types that are higher in omega-3 fatty acids, including salmon, herring, mussels, trout, sardines, and pollock. Make sure that all meats are cooked to food-safe temperatures. Dairy Pasteurized milk and milk alternatives. Pasteurized yogurt and pasteurized cheese. Cottage cheese. Sour  cream. Beverages Water. Juices that contain 100% fruit juice or vegetable juice. Caffeine-free teas and decaffeinated coffee. Drinks that contain caffeine are okay to drink, but it is better to avoid caffeine. Keep your total caffeine intake to less than 200 mg each day (12 oz of coffee, tea, or soda) or as directed by your health care provider. Condiments Any pasteurized condiments. Sweets and Desserts Any sweets and desserts. Fats and Oils Any fats and oils. The items listed above may not be a complete list of recommended foods or beverages. Contact your dietitian for more options. What foods are not recommended? Vegetables Unpasteurized (  raw) vegetable juices. Fruits Unpasteurized (raw) fruit juices. Meats and Other Protein Sources Cured meats that have nitrates, such as bacon, salami, and hotdogs. Luncheon meats, bologna, or other deli meats (unless they are reheated until they are steaming hot). Refrigerated pate, meat spreads from a meat counter, smoked seafood that is found in the refrigerated section of a store. Raw fish, such as sushi or sashimi. High mercury content fish, such as tilefish, shark, swordfish, and king mackerel. Raw meats, such as tuna or beef tartare. Undercooked meats and poultry. Make sure that all meats are cooked to food-safe temperatures. Dairy Unpasteurized (raw) milk and any foods that have raw milk in them. Soft cheeses, such as feta, queso blanco, queso fresco, Brie, Camembert cheeses, blue-veined cheeses, and Panela cheese (unless it is made with pasteurized milk, which must be stated on the label). Beverages Alcohol. Sugar-sweetened beverages, such as sodas, teas, or energy drinks. Condiments Homemade fermented foods and drinks, such as pickles, sauerkraut, or kombucha drinks. (Store-bought pasteurized versions of these are okay.) Other Salads that are made in the store, such as ham salad, chicken salad, egg salad, tuna salad, and seafood salad. The items  listed above may not be a complete list of foods and beverages to avoid. Contact your dietitian for more information. This information is not intended to replace advice given to you by your health care provider. Make sure you discuss any questions you have with your health care provider. Document Released: 10/18/2013 Document Revised: 06/11/2015 Document Reviewed: 06/18/2013 Elsevier Interactive Patient Education  2018 [employer]. Common Medications Safe in Pregnancy  Acne:      Constipation:  Benzoyl Peroxide     Colace  Clindamycin      Dulcolax Suppository  Topica Erythromycin     Fibercon  Salicylic Acid      Metamucil         Miralax AVOID:        Senakot   Accutane    Cough:  Retin-A       Cough Drops  Tetracycline      Phenergan w/ Codeine if Rx  Minocycline      Robitussin (Plain & DM)  Antibiotics:     Crabs/Lice:  Ceclor       RID  Cephalosporins    AVOID:  E-Mycins      Kwell  Keflex  Macrobid/Macrodantin   Diarrhea:  Penicillin      Kao-Pectate  Zithromax      Imodium AD         PUSH FLUIDS AVOID:       Cipro     Fever:  Tetracycline      Tylenol (Regular or Extra  Minocycline       Strength)  Levaquin      Extra Strength-Do not          Exceed 8 tabs/24 hrs Caffeine:        '200mg'$ /day (equiv. To 1 cup of coffee or  approx. 3 12 oz sodas)         Gas: Cold/Hayfever:       Gas-X  Benadryl      Mylicon  Claritin       Phazyme  **Claritin-D        Chlor-Trimeton    Headaches:  Dimetapp      ASA-Free Excedrin  Drixoral-Non-Drowsy     Cold Compress  Mucinex (Guaifenasin)     Tylenol (Regular or Extra  Sudafed/Sudafed-12 Hour     Strength)  **Sudafed PE  Pseudoephedrine   Tylenol Cold & Sinus     Vicks Vapor Rub  Zyrtec  **AVOID if Problems With Blood Pressure         Heartburn: Avoid lying down for at least 1 hour after meals  Aciphex      Maalox     Rash:  Milk of Magnesia     Benadryl    Mylanta       1% Hydrocortisone Cream  Pepcid  Pepcid  Complete   Sleep Aids:  Prevacid      Ambien   Prilosec       Benadryl  Rolaids       Chamomile Tea  Tums (Limit 4/day)     Unisom  Zantac       Tylenol PM         Warm milk-add vanilla or  Hemorrhoids:       Sugar for taste  Anusol/Anusol H.C.  (RX: Analapram 2.5%)  Sugar Substitutes:  Hydrocortisone OTC     Ok in moderation  Preparation H      Tucks        Vaseline lotion applied to tissue with wiping    Herpes:     Throat:  Acyclovir      Oragel  Famvir  Valtrex     Vaccines:         Flu Shot Leg Cramps:       *Gardasil  Benadryl      Hepatitis A         Hepatitis B Nasal Spray:       Pneumovax  Saline Nasal Spray     Polio Booster         Tetanus Nausea:       Tuberculosis test or PPD  Vitamin B6 25 mg TID   AVOID:    Dramamine      *Gardasil  Emetrol       Live Poliovirus  Ginger Root 250 mg QID    MMR (measles, mumps &  High Complex Carbs @ Bedtime    rebella)  Sea Bands-Accupressure    Varicella (Chickenpox)  Unisom 1/2 tab TID     *No known complications           If received before Pain:         Known pregnancy;   Darvocet       Resume series after  Lortab        Delivery  Percocet    Yeast:   Tramadol      Femstat  Tylenol 3      Gyne-lotrimin  Ultram       Monistat  Vicodin           MISC:         All Sunscreens           Hair Coloring/highlights          Insect Repellant's          (Including DEET)         Mystic Tans Second Trimester of Pregnancy The second trimester is from week 13 through week 28, month 4 through 6. This is often the time in pregnancy that you feel your best. Often times, morning sickness has lessened or quit. You may have more energy, and you may get hungry more often. Your unborn baby (fetus) is growing rapidly. At the end of the sixth month, he or she is about 9 inches long and weighs about 1 pounds. You will  likely feel the baby move (quickening) between 18 and 20 weeks of pregnancy. Follow these instructions at home:  Avoid  all smoking, herbs, and alcohol. Avoid drugs not approved by your doctor.  Do not use any tobacco products, including cigarettes, chewing tobacco, and electronic cigarettes. If you need help quitting, ask your doctor. You may get counseling or other support to help you quit.  Only take medicine as told by your doctor. Some medicines are safe and some are not during pregnancy.  Exercise only as told by your doctor. Stop exercising if you start having cramps.  Eat regular, healthy meals.  Wear a good support bra if your breasts are tender.  Do not use hot tubs, steam rooms, or saunas.  Wear your seat belt when driving.  Avoid raw meat, uncooked cheese, and liter boxes and soil used by cats.  Take your prenatal vitamins.  Take 1500-2000 milligrams of calcium daily starting at the 20th week of pregnancy until you deliver your baby.  Try taking medicine that helps you poop (stool softener) as needed, and if your doctor approves. Eat more fiber by eating fresh fruit, vegetables, and whole grains. Drink enough fluids to keep your pee (urine) clear or pale yellow.  Take warm water baths (sitz baths) to soothe pain or discomfort caused by hemorrhoids. Use hemorrhoid cream if your doctor approves.  If you have puffy, bulging veins (varicose veins), wear support hose. Raise (elevate) your feet for 15 minutes, 3-4 times a day. Limit salt in your diet.  Avoid heavy lifting, wear low heals, and sit up straight.  Rest with your legs raised if you have leg cramps or low back pain.  Visit your dentist if you have not gone during your pregnancy. Use a soft toothbrush to brush your teeth. Be gentle when you floss.  You can have sex (intercourse) unless your doctor tells you not to.  Go to your doctor visits. Get help if:  You feel dizzy.  You have mild cramps or pressure in your lower belly (abdomen).  You have a nagging pain in your belly area.  You continue to feel sick to your stomach  (nauseous), throw up (vomit), or have watery poop (diarrhea).  You have bad smelling fluid coming from your vagina.  You have pain with peeing (urination). Get help right away if:  You have a fever.  You are leaking fluid from your vagina.  You have spotting or bleeding from your vagina.  You have severe belly cramping or pain.  You lose or gain weight rapidly.  You have trouble catching your breath and have chest pain.  You notice sudden or extreme puffiness (swelling) of your face, hands, ankles, feet, or legs.  You have not felt the baby move in over an hour.  You have severe headaches that do not go away with medicine.  You have vision changes. This information is not intended to replace advice given to you by your health care provider. Make sure you discuss any questions you have with your health care provider. Document Released: 03/30/2009 Document Revised: 06/11/2015 Document Reviewed: 03/06/2012 Elsevier Interactive Patient Education  2017 [employer].

## 2017-10-10 ENCOUNTER — Telehealth: Payer: Self-pay | Admitting: Certified Nurse Midwife

## 2017-10-10 ENCOUNTER — Encounter: Payer: Self-pay | Admitting: Certified Nurse Midwife

## 2017-10-10 ENCOUNTER — Telehealth: Payer: Self-pay

## 2017-10-10 NOTE — Telephone Encounter (Signed)
See previous message dated today

## 2017-10-10 NOTE — Telephone Encounter (Signed)
The patient called and stated that she would like to speak with a nurse in regards to her requesting to do genetic testing and the response she got was to get a form filled out by [facility] and that she also needs a kit. Please advise.

## 2017-10-10 NOTE — Telephone Encounter (Signed)
Spoke with pt- [employer] form filled out,blood tubes labeled with pts name,copy of ins card included and pt stated she will stop by today to pick everything up.

## 2017-10-11 ENCOUNTER — Telehealth: Payer: Self-pay

## 2017-10-11 NOTE — Telephone Encounter (Signed)
On 10/10/17 while this Clinical research associate was with another patient, this patient came to the office to pick up [employer] supplies and paperwork. She stated in previous conversation she had an appointment with Avelina Laine to get her blood taken on 10/12/17. She was taken to a room by VT and VT drew her blood. Patient did not sign informed consent. This Clinical research associate came out of the other patients  room and was told the specimens were in room 14. Tubes were already labeled and patient had left the office. Specimen was put in bag and put with outgoing mail.

## 2017-10-12 DIAGNOSIS — Z8759 Personal history of other complications of pregnancy, childbirth and the puerperium: Secondary | ICD-10-CM | POA: Insufficient documentation

## 2017-10-17 ENCOUNTER — Encounter: Payer: Self-pay | Admitting: Certified Nurse Midwife

## 2017-11-01 ENCOUNTER — Encounter: Payer: Self-pay | Admitting: Certified Nurse Midwife

## 2017-11-06 ENCOUNTER — Ambulatory Visit (INDEPENDENT_AMBULATORY_CARE_PROVIDER_SITE_OTHER): Payer: 59 | Admitting: Certified Nurse Midwife

## 2017-11-06 VITALS — BP 120/66 | HR 83 | Wt 161.3 lb

## 2017-11-06 DIAGNOSIS — Z3482 Encounter for supervision of other normal pregnancy, second trimester: Secondary | ICD-10-CM | POA: Diagnosis not present

## 2017-11-06 LAB — POCT URINALYSIS DIPSTICK OB
BILIRUBIN UA: NEGATIVE
GLUCOSE, UA: NEGATIVE
KETONES UA: NEGATIVE
Leukocytes, UA: NEGATIVE
Nitrite, UA: NEGATIVE
PH UA: 5 (ref 5.0–8.0)
RBC UA: NEGATIVE
Spec Grav, UA: 1.02 (ref 1.010–1.025)
UROBILINOGEN UA: 0.2 U/dL

## 2017-11-06 NOTE — Progress Notes (Signed)
ROB, doing well. Pt continues to have some cramping. Denies bleeding or increasing intensity. Reassurance give. Discussed round ligament pain. Encouraged use of belly pain as needed. Reviewed anatomy scan at next visit. She verbalizes and agrees to plan. Follow up 4 wks.   Doreene Burke, CNM

## 2017-11-06 NOTE — Patient Instructions (Signed)

## 2017-12-05 ENCOUNTER — Other Ambulatory Visit: Payer: 59

## 2017-12-05 ENCOUNTER — Ambulatory Visit (INDEPENDENT_AMBULATORY_CARE_PROVIDER_SITE_OTHER): Payer: 59

## 2017-12-05 ENCOUNTER — Ambulatory Visit (INDEPENDENT_AMBULATORY_CARE_PROVIDER_SITE_OTHER): Payer: 59 | Admitting: Obstetrics and Gynecology

## 2017-12-05 ENCOUNTER — Encounter: Payer: 59 | Admitting: Obstetrics and Gynecology

## 2017-12-05 VITALS — BP 111/75 | HR 80 | Wt 164.7 lb

## 2017-12-05 DIAGNOSIS — Z3492 Encounter for supervision of normal pregnancy, unspecified, second trimester: Secondary | ICD-10-CM | POA: Diagnosis not present

## 2017-12-05 DIAGNOSIS — Z3482 Encounter for supervision of other normal pregnancy, second trimester: Secondary | ICD-10-CM

## 2017-12-05 LAB — POCT URINALYSIS DIPSTICK OB
BILIRUBIN UA: NEGATIVE
Blood, UA: NEGATIVE
GLUCOSE, UA: NEGATIVE
Ketones, UA: NEGATIVE
Leukocytes, UA: NEGATIVE
Nitrite, UA: NEGATIVE
PH UA: 7 (ref 5.0–8.0)
POC,PROTEIN,UA: NEGATIVE
Spec Grav, UA: 1.01 (ref 1.010–1.025)
Urobilinogen, UA: 0.2 E.U./dL

## 2017-12-05 NOTE — Progress Notes (Signed)
ROB- anatomy scan done today, pt is doing well 

## 2017-12-05 NOTE — Progress Notes (Signed)
ROB & anatomy scan- doing well, no concerns. Discussed ultrasound findings and need for f/u scan at 28 weeks   Indications:Anatomy Ultrasound Findings:  Singleton intrauterine pregnancy is visualized with FHR at 150 BPM. Biometrics give an (U/S) Gestational age of 4031w5d and an (U/S) EDD of 05/03/18; this correlates with the clinically established Estimated Date of Delivery: 04/28/18  Fetal presentation is Cephalic, then breech EFW: wnl. Placenta: posterior. Grade: 0, placenta to cervical IO = 0.38cm (low Lying) AFI: subjectively normal.  Anatomic survey is complete and normal; Gender - female.    Right Ovary is normal in appearance. Left Ovary is normal appearance. Survey of the adnexa demonstrates no adnexal masses. There is no free peritoneal fluid in the cul de sac.  Impression: 1. 7877w3d Viable Singleton Intrauterine pregnancy by U/S. 2. (U/S) EDD is consistent with Clinically established Estimated Date of Delivery: 04/28/18 .3. Normal Anatomy Scan 4. placenta to cervical IO = 0.38cm (low Lying)

## 2017-12-11 ENCOUNTER — Emergency Department
Admission: EM | Admit: 2017-12-11 | Discharge: 2017-12-11 | Disposition: A | Discharge status: Home / Self Care | Payer: 59 | Source: Home / Self Care | Attending: Emergency Medicine | Admitting: Emergency Medicine

## 2017-12-11 ENCOUNTER — Encounter: Payer: Self-pay | Admitting: Emergency Medicine

## 2017-12-11 ENCOUNTER — Observation Stay
Admission: EM | Admit: 2017-12-11 | Discharge: 2017-12-12 | Disposition: A | Discharge status: Home / Self Care | Payer: 59 | Attending: Certified Nurse Midwife | Admitting: Certified Nurse Midwife

## 2017-12-11 DIAGNOSIS — Z331 Pregnant state, incidental: Secondary | ICD-10-CM

## 2017-12-11 DIAGNOSIS — O4442 Low lying placenta NOS or without hemorrhage, second trimester: Secondary | ICD-10-CM | POA: Diagnosis not present

## 2017-12-11 DIAGNOSIS — O4440 Low lying placenta NOS or without hemorrhage, unspecified trimester: Secondary | ICD-10-CM | POA: Diagnosis present

## 2017-12-11 DIAGNOSIS — S161XXA Strain of muscle, fascia and tendon at neck level, initial encounter: Secondary | ICD-10-CM

## 2017-12-11 DIAGNOSIS — S39012A Strain of muscle, fascia and tendon of lower back, initial encounter: Secondary | ICD-10-CM | POA: Insufficient documentation

## 2017-12-11 DIAGNOSIS — Y929 Unspecified place or not applicable: Secondary | ICD-10-CM | POA: Insufficient documentation

## 2017-12-11 DIAGNOSIS — S3991XA Unspecified injury of abdomen, initial encounter: Secondary | ICD-10-CM | POA: Diagnosis not present

## 2017-12-11 DIAGNOSIS — O26892 Other specified pregnancy related conditions, second trimester: Secondary | ICD-10-CM | POA: Insufficient documentation

## 2017-12-11 DIAGNOSIS — Z3A20 20 weeks gestation of pregnancy: Secondary | ICD-10-CM | POA: Diagnosis not present

## 2017-12-11 DIAGNOSIS — Y939 Activity, unspecified: Secondary | ICD-10-CM | POA: Insufficient documentation

## 2017-12-11 DIAGNOSIS — Z79899 Other long term (current) drug therapy: Secondary | ICD-10-CM

## 2017-12-11 DIAGNOSIS — Z041 Encounter for examination and observation following transport accident: Secondary | ICD-10-CM | POA: Diagnosis not present

## 2017-12-11 DIAGNOSIS — Z888 Allergy status to other drugs, medicaments and biological substances status: Secondary | ICD-10-CM | POA: Diagnosis not present

## 2017-12-11 DIAGNOSIS — Y999 Unspecified external cause status: Secondary | ICD-10-CM

## 2017-12-11 MED ORDER — CYCLOBENZAPRINE HCL 10 MG PO TABS
10.0000 mg | ORAL_TABLET | Freq: Three times a day (TID) | ORAL | Status: DC | PRN
  Administered 2017-12-11: 10 mg via ORAL
  Filled 2017-12-11 (×2): qty 1

## 2017-12-11 MED ORDER — ACETAMINOPHEN 500 MG PO TABS: 1000.0000 mg | ORAL_TABLET | Freq: Four times a day (QID) | ORAL | Status: DC | PRN

## 2017-12-11 MED ORDER — CYCLOBENZAPRINE HCL 10 MG PO TABS: 10.0000 mg | ORAL_TABLET | Freq: Three times a day (TID) | ORAL | 0 refills | Status: DC | PRN
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
## 2017-12-11 NOTE — Discharge Instructions (Signed)
Preterm Labor and Birth Information Pregnancy normally lasts 39-41 weeks. Preterm labor is when labor starts early. It starts before you have been pregnant for 37 whole weeks. What are the risk factors for preterm labor? Preterm labor is more likely to occur in women who:  Have an infection while pregnant.  Have a cervix that is short.  Have gone into preterm labor before.  Have had surgery on their cervix.  Are younger than age 26.  Are older than age 335.  Are African American.  Are pregnant with two or more babies.  Take street drugs while pregnant.  Smoke while pregnant.  Do not gain enough weight while pregnant.  Got pregnant right after another pregnancy.  What are the symptoms of preterm labor? Symptoms of preterm labor include:  Cramps. The cramps may feel like the cramps some women get during their period. The cramps may happen with watery poop (diarrhea).  Pain in the belly (abdomen).  Pain in the lower back.  Regular contractions or tightening. It may feel like your belly is getting tighter.  Pressure in the lower belly that seems to get stronger.  More fluid (discharge) leaking from the vagina. The fluid may be watery or bloody.  Water breaking.  Why is it important to notice signs of preterm labor? Babies who are born early may not be fully developed. They have a higher chance for:  Long-term heart problems.  Long-term lung problems.  Trouble controlling body systems, like breathing.  Bleeding in the brain.  A condition called cerebral palsy.  Learning difficulties.  Death.  These risks are highest for babies who are born before 34 weeks of pregnancy. How is preterm labor treated? Treatment depends on:  How long you were pregnant.  Your condition.  The health of your baby.  Treatment may involve:  Having a stitch (suture) placed in your cervix. When you give birth, your cervix opens so the baby can come out. The stitch keeps the  cervix from opening too soon.  Staying at the hospital.  Taking or getting medicines, such as: ? Hormone medicines. ? Medicines to stop contractions. ? Medicines to help the babys lungs develop. ? Medicines to prevent your baby from having cerebral palsy.  What should I do if I am in preterm labor? If you think you are going into labor too soon, call your doctor right away. How can I prevent preterm labor?  Do not use any tobacco products. ? Examples of these are cigarettes, chewing tobacco, and e-cigarettes. ? If you need help quitting, ask your doctor.  Do not use street drugs.  Do not use any medicines unless you ask your doctor if they are safe for you.  Talk with your doctor before taking any herbal supplements.  Make sure you gain enough weight.  Watch for infection. If you think you might have an infection, get it checked right away.  If you have gone into preterm labor before, tell your doctor. This information is not intended to replace advice given to you by your health care provider. Make sure you discuss any questions you have with your health care provider. Document Released: 04/01/2008 Document Revised: 06/16/2015 Document Reviewed: 05/27/2015 Elsevier Interactive Patient Education  2018 [employer] Round Ligament Pain The round ligament is a cord of muscle and tissue that helps to support the uterus. It can become a source of pain during pregnancy if it becomes stretched or twisted as the baby grows. The pain usually begins in the  second trimester of pregnancy, and it can come and go until the baby is delivered. It is not a serious problem, and it does not cause harm to the baby. Round ligament pain is usually a short, sharp, and pinching pain, but it can also be a dull, lingering, and aching pain. The pain is felt in the lower side of the abdomen or in the groin. It usually starts deep in the groin and moves up to the outside of the hip area. Pain can occur  with:  A sudden change in position.  Rolling over in bed.  Coughing or sneezing.  Physical activity.  Follow these instructions at home: Watch your condition for any changes. Take these steps to help with your pain:  When the pain starts, relax. Then try: ? Sitting down. ? Flexing your knees up to your abdomen. ? Lying on your side with one pillow under your abdomen and another pillow between your legs. ? Sitting in a warm bath for 15-20 minutes or until the pain goes away.  Take over-the-counter and prescription medicines only as told by your health care provider.  Move slowly when you sit and stand.  Avoid long walks if they cause pain.  Stop or lessen your physical activities if they cause pain.  Contact a health care provider if:  Your pain does not go away with treatment.  You feel pain in your back that you did not have before.  Your medicine is not helping. Get help right away if:  You develop a fever or chills.  You develop uterine contractions.  You develop vaginal bleeding.  You develop nausea or vomiting.  You develop diarrhea.  You have pain when you urinate. This information is not intended to replace advice given to you by your health care provider. Make sure you discuss any questions you have with your health care provider. Document Released: 10/13/2007 Document Revised: 06/11/2015 Document Reviewed: 03/12/2014 Elsevier Interactive Patient Education  2018 [employer] Back Pain in Pregnancy Back pain during pregnancy is common. Back pain may be caused by several factors that are related to changes during your pregnancy. Follow these instructions at home: Managing pain, stiffness, and swelling  If directed, apply ice for sudden (acute) back pain. ? Put ice in a plastic bag. ? Place a towel between your skin and the bag. ? Leave the ice on for 20 minutes, 2-3 times per day.  If directed, apply heat to the affected area before you  exercise: ? Place a towel between your skin and the heat pack or heating pad. ? Leave the heat on for 20-30 minutes. ? Remove the heat if your skin turns bright red. This is especially important if you are unable to feel pain, heat, or cold. You may have a greater risk of getting burned. Activity  Exercise as told by your health care provider. Exercising is the best way to prevent or manage back pain.  Listen to your body when lifting. If lifting hurts, ask for help or bend your knees. This uses your leg muscles instead of your back muscles.  Squat down when picking up something from the floor. Do not bend over.  Only use bed rest as told by your health care provider. Bed rest should only be used for the most severe episodes of back pain. Standing, Sitting, and Lying Down  Do not stand in one place for long periods of time.  Use good posture when sitting. Make sure your head rests over your  shoulders and is not hanging forward. Use a pillow on your lower back if necessary.  Try sleeping on your side, preferably the left side, with a pillow or two between your legs. If you are sore after a night's rest, your bed may be too soft. A firm mattress may provide more support for your back during pregnancy. General instructions  Do not wear high heels.  Eat a healthy diet. Try to gain weight within your health care provider's recommendations.  Use a maternity girdle, elastic sling, or back brace as told by your health care provider.  Take over-the-counter and prescription medicines only as told by your health care provider.  Keep all follow-up visits as told by your health care provider. This is important. This includes any visits with any specialists, such as a physical therapist. Contact a health care provider if:  Your back pain interferes with your daily activities.  You have increasing pain in other parts of your body. Get help right away if:  You develop numbness, tingling,  weakness, or problems with the use of your arms or legs.  You develop severe back pain that is not controlled with medicine.  You have a sudden change in bowel or bladder control.  You develop shortness of breath, dizziness, or you faint.  You develop nausea, vomiting, or sweating.  You have back pain that is a rhythmic, cramping pain similar to labor pains. Labor pain is usually 1-2 minutes apart, lasts for about 1 minute, and involves a bearing down feeling or pressure in your pelvis.  You have back pain and your water breaks or you have vaginal bleeding.  You have back pain or numbness that travels down your leg.  Your back pain developed after you fell.  You develop pain on one side of your back.  You see blood in your urine.  You develop skin blisters in the area of your back pain. This information is not intended to replace advice given to you by your health care provider. Make sure you discuss any questions you have with your health care provider. Document Released: 04/13/2005 Document Revised: 06/11/2015 Document Reviewed: 09/17/2014 Elsevier Interactive Patient Education  2018 [employer]. Abdominal Pain During Pregnancy Belly (abdominal) pain is common during pregnancy. Most of the time, it is not a serious problem. Other times, it can be a sign that something is wrong with the pregnancy. Always tell your doctor if you have belly pain. Follow these instructions at home: Monitor your belly pain for any changes. The following actions may help you feel better:  Do not have sex (intercourse) or put anything in your vagina until you feel better.  Rest until your pain stops.  Drink clear fluids if you feel sick to your stomach (nauseous). Do not eat solid food until you feel better.  Only take medicine as told by your doctor.  Keep all doctor visits as told.  Get help right away if:  You are bleeding, leaking fluid, or pieces of tissue come out of your vagina.  You  have more pain or cramping.  You keep throwing up (vomiting).  You have pain when you pee (urinate) or have blood in your pee.  You have a fever.  You do not feel your baby moving as much.  You feel very weak or feel like passing out.  You have trouble breathing, with or without belly pain.  You have a very bad headache and belly pain.  You have fluid leaking from your vagina  and belly pain.  You keep having watery poop (diarrhea).  Your belly pain does not go away after resting, or the pain gets worse. This information is not intended to replace advice given to you by your health care provider. Make sure you discuss any questions you have with your health care provider. Document Released: 12/22/2008 Document Revised: 08/12/2015 Document Reviewed: 08/02/2012 Elsevier Interactive Patient Education  [employer].
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
## 2017-12-12 ENCOUNTER — Encounter: Payer: Self-pay | Admitting: Certified Nurse Midwife

## 2017-12-19 ENCOUNTER — Encounter: Payer: Self-pay | Admitting: Certified Nurse Midwife

## 2017-12-25 ENCOUNTER — Ambulatory Visit (INDEPENDENT_AMBULATORY_CARE_PROVIDER_SITE_OTHER): Payer: 59 | Admitting: Obstetrics and Gynecology

## 2017-12-25 VITALS — BP 123/68 | HR 78 | Wt 164.5 lb

## 2017-12-25 DIAGNOSIS — O99612 Diseases of the digestive system complicating pregnancy, second trimester: Secondary | ICD-10-CM

## 2017-12-25 DIAGNOSIS — Z3492 Encounter for supervision of normal pregnancy, unspecified, second trimester: Secondary | ICD-10-CM

## 2017-12-25 DIAGNOSIS — K5909 Other constipation: Secondary | ICD-10-CM

## 2017-12-25 DIAGNOSIS — K141 Geographic tongue: Secondary | ICD-10-CM

## 2017-12-25 LAB — POCT URINALYSIS DIPSTICK OB
BILIRUBIN UA: NEGATIVE
Glucose, UA: NEGATIVE
Ketones, UA: NEGATIVE
LEUKOCYTES UA: NEGATIVE
Nitrite, UA: NEGATIVE
PH UA: 6 (ref 5.0–8.0)
POC,PROTEIN,UA: NEGATIVE
RBC UA: NEGATIVE
Spec Grav, UA: 1.01 (ref 1.010–1.025)
UROBILINOGEN UA: 0.2 U/dL

## 2017-12-25 MED ORDER — MAGIC MOUTHWASH: 5.0000 mL | Freq: Three times a day (TID) | ORAL | 1 refills | Status: DC | PRN

## 2017-12-25 MED ORDER — CITRANATAL HARMONY 27-1-260 MG PO CAPS: 1.0000 | ORAL_CAPSULE | Freq: Every day | ORAL | 8 refills | Status: DC
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
## 2017-12-25 NOTE — Progress Notes (Signed)
ROB- pt went to ER 12/11/17, pt is doing well

## 2018-01-02 ENCOUNTER — Encounter: Payer: 59 | Admitting: Certified Nurse Midwife

## 2018-01-17 NOTE — L&D Delivery Note (Signed)
Delivery Summary for [facility]  Labor Events:   Preterm labor:   Rupture date:   Rupture time:   Rupture type:   Fluid Color:   Induction:   Augmentation:   Complications:   Cervical ripening:          Delivery:   Episiotomy:   Lacerations:   Repair suture:   Repair # of packets:   Blood loss (ml): 600   Information for the patient's newborn:  Mary Parsons, Mary Parsons [[id number]]    Delivery 04/24/2018 12:11 PM by  C-Section, Low Transverse Sex:  female Gestational Age: [redacted]w[redacted]d Delivery Clinician:   Living?:         APGARS  One minute Five minutes Ten minutes  Skin color:        Heart rate:        Grimace:        Muscle tone:        Breathing:        Totals: 8  9      Presentation/position:      Resuscitation:   Cord information:    Disposition of cord blood:     Blood gases sent?  Complications:   Placenta: Delivered:       appearance Newborn Measurements: Weight: 7 lb 0.9 oz (3200 g)  Height: 19.69"  Head circumference:    Chest circumference:    Other providers:    Additional  information: Forceps:   Vacuum:   Breech:   Observed anomalies        See Dr. Oretha Milch operative note for details of C-section procedure.    Hildred Laser, MD [facility]

## 2018-01-18 ENCOUNTER — Encounter: Payer: Self-pay | Admitting: Certified Nurse Midwife

## 2018-01-22 ENCOUNTER — Encounter: Payer: Self-pay | Admitting: Certified Nurse Midwife

## 2018-01-22 ENCOUNTER — Other Ambulatory Visit (HOSPITAL_COMMUNITY)
Admission: RE | Admit: 2018-01-22 | Discharge: 2018-01-22 | Disposition: A | Discharge status: Home / Self Care | Payer: 59 | Source: Ambulatory Visit | Attending: Certified Nurse Midwife | Admitting: Certified Nurse Midwife

## 2018-01-22 ENCOUNTER — Ambulatory Visit (INDEPENDENT_AMBULATORY_CARE_PROVIDER_SITE_OTHER): Payer: 59 | Admitting: Certified Nurse Midwife

## 2018-01-22 VITALS — BP 115/64 | HR 72 | Wt 164.8 lb

## 2018-01-22 DIAGNOSIS — Z131 Encounter for screening for diabetes mellitus: Secondary | ICD-10-CM

## 2018-01-22 DIAGNOSIS — N898 Other specified noninflammatory disorders of vagina: Secondary | ICD-10-CM | POA: Insufficient documentation

## 2018-01-22 DIAGNOSIS — Z3493 Encounter for supervision of normal pregnancy, unspecified, third trimester: Secondary | ICD-10-CM

## 2018-01-22 DIAGNOSIS — O26899 Other specified pregnancy related conditions, unspecified trimester: Secondary | ICD-10-CM

## 2018-01-22 DIAGNOSIS — Z130 Encounter for screening for diseases of the blood and blood-forming organs and certain disorders involving the immune mechanism: Secondary | ICD-10-CM

## 2018-01-22 DIAGNOSIS — R102 Pelvic and perineal pain: Secondary | ICD-10-CM | POA: Insufficient documentation

## 2018-01-22 DIAGNOSIS — Z3492 Encounter for supervision of normal pregnancy, unspecified, second trimester: Secondary | ICD-10-CM

## 2018-01-22 DIAGNOSIS — Z113 Encounter for screening for infections with a predominantly sexual mode of transmission: Secondary | ICD-10-CM

## 2018-01-22 DIAGNOSIS — O26892 Other specified pregnancy related conditions, second trimester: Secondary | ICD-10-CM

## 2018-01-22 LAB — POCT URINALYSIS DIPSTICK OB
BILIRUBIN UA: NEGATIVE
Blood, UA: NEGATIVE
GLUCOSE, UA: NEGATIVE
KETONES UA: NEGATIVE
Leukocytes, UA: NEGATIVE
Nitrite, UA: NEGATIVE
Spec Grav, UA: 1.025 (ref 1.010–1.025)
UROBILINOGEN UA: 0.2 U/dL
pH, UA: 6.5 (ref 5.0–8.0)

## 2018-01-22 NOTE — Patient Instructions (Signed)
Third Trimester of Pregnancy  The third trimester is from week 28 through week 40 (months 7 through 9). This trimester is when your unborn baby (fetus) is growing very fast. At the end of the ninth month, the unborn baby is about 20 inches in length. It weighs about 6-10 pounds. Follow these instructions at home: Medicines  Take over-the-counter and prescription medicines only as told by your doctor. Some medicines are safe and some medicines are not safe during pregnancy.  Take a prenatal vitamin that contains at least 600 micrograms (mcg) of folic acid.  If you have trouble pooping (constipation), take medicine that will make your stool soft (stool softener) if your doctor approves. Eating and drinking   Eat regular, healthy meals.  Avoid raw meat and uncooked cheese.  If you get low calcium from the food you eat, talk to your doctor about taking a daily calcium supplement.  Eat four or five small meals rather than three large meals a day.  Avoid foods that are high in fat and sugars, such as fried and sweet foods.  To prevent constipation: ? Eat foods that are high in fiber, like fresh fruits and vegetables, whole grains, and beans. ? Drink enough fluids to keep your pee (urine) clear or pale yellow. Activity  Exercise only as told by your doctor. Stop exercising if you start to have cramps.  Avoid heavy lifting, wear low heels, and sit up straight.  Do not exercise if it is too hot, too humid, or if you are in a place of great height (high altitude).  You may continue to have sex unless your doctor tells you not to. Relieving pain and discomfort  Wear a good support bra if your breasts are tender.  Take frequent breaks and rest with your legs raised if you have leg cramps or low back pain.  Take warm water baths (sitz baths) to soothe pain or discomfort caused by hemorrhoids. Use hemorrhoid cream if your doctor approves.  If you develop puffy, bulging veins (varicose  veins) in your legs: ? Wear support hose or compression stockings as told by your doctor. ? Raise (elevate) your feet for 15 minutes, 3-4 times a day. ? Limit salt in your food. Safety  Wear your seat belt when driving.  Make a list of emergency phone numbers, including numbers for family, friends, the hospital, and police and fire departments. Preparing for your baby's arrival To prepare for the arrival of your baby:  Take prenatal classes.  Practice driving to the hospital.  Visit the hospital and tour the maternity area.  Talk to your work about taking leave once the baby comes.  Pack your hospital bag.  Prepare the baby's room.  Go to your doctor visits.  Buy a rear-facing car seat. Learn how to install it in your car. General instructions  Do not use hot tubs, steam rooms, or saunas.  Do not use any products that contain nicotine or tobacco, such as cigarettes and e-cigarettes. If you need help quitting, ask your doctor.  Do not drink alcohol.  Do not douche or use tampons or scented sanitary pads.  Do not cross your legs for long periods of time.  Do not travel for long distances unless you must. Only do so if your doctor says it is okay.  Visit your dentist if you have not gone during your pregnancy. Use a soft toothbrush to brush your teeth. Be gentle when you floss.  Avoid cat litter boxes and soil   used by cats. These carry germs that can cause birth defects in the baby and can cause a loss of your baby (miscarriage) or stillbirth.  Keep all your prenatal visits as told by your doctor. This is important. Contact a doctor if:  You are not sure if you are in labor or if your water has broken.  You are dizzy.  You have mild cramps or pressure in your lower belly.  You have a nagging pain in your belly area.  You continue to feel sick to your stomach, you throw up, or you have watery poop.  You have bad smelling fluid coming from your vagina.  You have  pain when you pee. Get help right away if:  You have a fever.  You are leaking fluid from your vagina.  You are spotting or bleeding from your vagina.  You have severe belly cramps or pain.  You lose or gain weight quickly.  You have trouble catching your breath and have chest pain.  You notice sudden or extreme puffiness (swelling) of your face, hands, ankles, feet, or legs.  You have not felt the baby move in over an hour.  You have severe headaches that do not go away with medicine.  You have trouble seeing.  You are leaking, or you are having a gush of fluid, from your vagina before you are 37 weeks.  You have regular belly spasms (contractions) before you are 37 weeks. Summary  The third trimester is from week 28 through week 40 (months 7 through 9). This time is when your unborn baby is growing very fast.  Follow your doctor's advice about medicine, food, and activity.  Get ready for the arrival of your baby by taking prenatal classes, getting all the baby items ready, preparing the baby's room, and visiting your doctor to be checked.  Get help right away if you are bleeding from your vagina, or you have chest pain and trouble catching your breath, or if you have not felt your baby move in over an hour. This information is not intended to replace advice given to you by your health care provider. Make sure you discuss any questions you have with your health care provider. Document Released: 03/30/2009 Document Revised: 02/09/2016 Document Reviewed: 02/09/2016 Elsevier Interactive Patient Education  2019 [employer] Back Pain in Pregnancy Back pain during pregnancy is common. Back pain may be caused by several factors that are related to changes during your pregnancy. Follow these instructions at home: Managing pain, stiffness, and swelling      If directed, for sudden (acute) back pain, put ice on the painful area. ? Put ice in a plastic bag. ? Place a towel  between your skin and the bag. ? Leave the ice on for 20 minutes, 2-3 times per day.  If directed, apply heat to the affected area before you exercise. Use the heat source that your health care provider recommends, such as a moist heat pack or a heating pad. ? Place a towel between your skin and the heat source. ? Leave the heat on for 20-30 minutes. ? Remove the heat if your skin turns bright red. This is especially important if you are unable to feel pain, heat, or cold. You may have a greater risk of getting burned.  If directed, massage the affected area. Activity  Exercise as told by your health care provider. Gentle exercise is the best way to prevent or manage back pain.  Listen to your body when  lifting. If lifting hurts, ask for help or bend your knees. This uses your leg muscles instead of your back muscles.  Squat down when picking up something from the floor. Do not bend over.  Only use bed rest for short periods as told by your health care provider. Bed rest should only be used for the most severe episodes of back pain. Standing, sitting, and lying down  Do not stand in one place for long periods of time.  Use good posture when sitting. Make sure your head rests over your shoulders and is not hanging forward. Use a pillow on your lower back if necessary.  Try sleeping on your side, preferably the left side, with a pregnancy support pillow or 1-2 regular pillows between your legs. ? If you have back pain after a night's rest, your bed may be too soft. ? A firm mattress may provide more support for your back during pregnancy. General instructions  Do not wear high heels.  Eat a healthy diet. Try to gain weight within your health care provider's recommendations.  Use a maternity girdle, elastic sling, or back brace as told by your health care provider.  Take over-the-counter and prescription medicines only as told by your health care provider.  Work with a physical  therapist or massage therapist to find ways to manage back pain. Acupuncture or massage therapy may be helpful.  Keep all follow-up visits as told by your health care provider. This is important. Contact a health care provider if:  Your back pain interferes with your daily activities.  You have increasing pain in other parts of your body. Get help right away if:  You develop numbness, tingling, weakness, or problems with the use of your arms or legs.  You develop severe back pain that is not controlled with medicine.  You have a change in bowel or bladder control.  You develop shortness of breath, dizziness, or you faint.  You develop nausea, vomiting, or sweating.  You have back pain that is a rhythmic, cramping pain similar to labor pains. Labor pain is usually 1-2 minutes apart, lasts for about 1 minute, and involves a bearing down feeling or pressure in your pelvis.  You have back pain and your water breaks or you have vaginal bleeding.  You have back pain or numbness that travels down your leg.  Your back pain developed after you fell.  You develop pain on one side of your back.  You see blood in your urine.  You develop skin blisters in the area of your back pain. Summary  Back pain may be caused by several factors that are related to changes during your pregnancy.  Follow instructions as told by your health care provider for managing pain, stiffness, and swelling.  Exercise as told by your health care provider. Gentle exercise is the best way to prevent or manage back pain.  Take over-the-counter and prescription medicines only as told by your health care provider.  Keep all follow-up visits as told by your health care provider. This is important. This information is not intended to replace advice given to you by your health care provider. Make sure you discuss any questions you have with your health care provider. Document Released: 04/13/2005 Document Revised:  06/21/2017 Document Reviewed: 06/21/2017 Elsevier Interactive Patient Education  2019 [employer] Inc. Round Ligament Pain  The round ligament is a cord of muscle and tissue that helps support the uterus. It can become a source of pain during pregnancy if  it becomes stretched or twisted as the baby grows. The pain usually begins in the second trimester (13-28 weeks) of pregnancy, and it can come and go until the baby is delivered. It is not a serious problem, and it does not cause harm to the baby. Round ligament pain is usually a short, sharp, and pinching pain, but it can also be a dull, lingering, and aching pain. The pain is felt in the lower side of the abdomen or in the groin. It usually starts deep in the groin and moves up to the outside of the hip area. The pain may occur when you:  Suddenly change position, such as quickly going from a sitting to standing position.  Roll over in bed.  Cough or sneeze.  Do physical activity. Follow these instructions at home:   Watch your condition for any changes.  When the pain starts, relax. Then try any of these methods to help with the pain: ? Sitting down. ? Flexing your knees up to your abdomen. ? Lying on your side with one pillow under your abdomen and another pillow between your legs. ? Sitting in a warm bath for 15-20 minutes or until the pain goes away.  Take over-the-counter and prescription medicines only as told by your health care provider.  Move slowly when you sit down or stand up.  Avoid long walks if they cause pain.  Stop or reduce your physical activities if they cause pain.  Keep all follow-up visits as told by your health care provider. This is important. Contact a health care provider if:  Your pain does not go away with treatment.  You feel pain in your back that you did not have before.  Your medicine is not helping. Get help right away if:  You have a fever or chills.  You develop uterine  contractions.  You have vaginal bleeding.  You have nausea or vomiting.  You have diarrhea.  You have pain when you urinate. Summary  Round ligament pain is felt in the lower abdomen or groin. It is usually a short, sharp, and pinching pain. It can also be a dull, lingering, and aching pain.  This pain usually begins in the second trimester (13-28 weeks). It occurs because the uterus is stretching with the growing baby, and it is not harmful to the baby.  You may notice the pain when you suddenly change position, when you cough or sneeze, or during physical activity.  Relaxing, flexing your knees to your abdomen, lying on one side, or taking a warm bath may help to get rid of the pain.  Get help from your health care provider if the pain does not go away or if you have vaginal bleeding, nausea, vomiting, diarrhea, or painful urination. This information is not intended to replace advice given to you by your health care provider. Make sure you discuss any questions you have with your health care provider. Document Released: 10/13/2007 Document Revised: 06/21/2017 Document Reviewed: 06/21/2017 Elsevier Interactive Patient Education  2019 [employer].

## 2018-01-22 NOTE — Progress Notes (Signed)
ROB, c/o intermittent pelvic pressure and pain that started 2 weeks ago.

## 2018-01-23 LAB — CERVICOVAGINAL ANCILLARY ONLY
BACTERIAL VAGINITIS: NEGATIVE
Candida vaginitis: NEGATIVE

## 2018-01-25 ENCOUNTER — Encounter: Payer: Self-pay | Admitting: Certified Nurse Midwife

## 2018-01-26 NOTE — Addendum Note (Signed)
Addended by: Shaune Spittle on: 01/26/2018 08:18 AM   Modules accepted: Orders

## 2018-01-26 NOTE — Progress Notes (Addendum)
ROB-Reports intermittent pelvic pressure and pain. Speculum exam: cervix closed and thick; vaginal swab collected. Discussed home treatment measures including use of abdominal support. Referral to physical therapy, see orders. Anticipatory guidance regarding course of prenatal care. Desires repeat cesarean section with Dr. Valentino Saxon as surgeon. Reviewed red flag symptoms and when to call. RTC x 2 weeks for 28 week labs, follow up ultrasound and ROB or sooner if needed

## 2018-01-30 ENCOUNTER — Encounter: Payer: Self-pay | Admitting: Certified Nurse Midwife

## 2018-01-30 ENCOUNTER — Other Ambulatory Visit: Payer: Self-pay

## 2018-01-30 MED ORDER — PANTOPRAZOLE SODIUM 20 MG PO TBEC: 20.0000 mg | DELAYED_RELEASE_TABLET | Freq: Every day | ORAL | 11 refills | Status: DC

## 2018-02-01 ENCOUNTER — Ambulatory Visit: Payer: Commercial Managed Care - HMO | Attending: Certified Nurse Midwife | Admitting: Physical Therapy

## 2018-02-01 ENCOUNTER — Other Ambulatory Visit: Payer: Self-pay

## 2018-02-01 ENCOUNTER — Encounter: Payer: Self-pay | Admitting: Physical Therapy

## 2018-02-01 DIAGNOSIS — R293 Abnormal posture: Secondary | ICD-10-CM | POA: Insufficient documentation

## 2018-02-01 DIAGNOSIS — L905 Scar conditions and fibrosis of skin: Secondary | ICD-10-CM

## 2018-02-01 DIAGNOSIS — R278 Other lack of coordination: Secondary | ICD-10-CM | POA: Diagnosis present

## 2018-02-01 DIAGNOSIS — M542 Cervicalgia: Secondary | ICD-10-CM | POA: Diagnosis present

## 2018-02-01 DIAGNOSIS — R2689 Other abnormalities of gait and mobility: Secondary | ICD-10-CM | POA: Diagnosis present

## 2018-02-01 DIAGNOSIS — N809 Endometriosis, unspecified: Secondary | ICD-10-CM | POA: Insufficient documentation

## 2018-02-01 NOTE — Patient Instructions (Addendum)
Open book (handout)   At work: move monitors so you can face forward and less repeated R trunk turns  At work: sit with feet under knees to promote more pelvic stability  At work: 6 -direction of spine stretches every hour,    Wear SIJ belt   ___  In bed: turn with 4 points of contact ( elbows and feet, knees bent), scoot hips incrementally then drop knees to roll over
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
## 2018-02-05 ENCOUNTER — Ambulatory Visit: Payer: Commercial Managed Care - HMO | Admitting: Physical Therapy

## 2018-02-06 ENCOUNTER — Other Ambulatory Visit: Payer: 59

## 2018-02-06 ENCOUNTER — Ambulatory Visit (INDEPENDENT_AMBULATORY_CARE_PROVIDER_SITE_OTHER): Payer: 59

## 2018-02-06 ENCOUNTER — Ambulatory Visit (INDEPENDENT_AMBULATORY_CARE_PROVIDER_SITE_OTHER): Payer: 59 | Admitting: Certified Nurse Midwife

## 2018-02-06 ENCOUNTER — Other Ambulatory Visit: Payer: Self-pay | Admitting: Obstetrics and Gynecology

## 2018-02-06 ENCOUNTER — Encounter: Payer: 59 | Admitting: Certified Nurse Midwife

## 2018-02-06 VITALS — BP 136/70 | HR 96 | Wt 167.3 lb

## 2018-02-06 DIAGNOSIS — Z130 Encounter for screening for diseases of the blood and blood-forming organs and certain disorders involving the immune mechanism: Secondary | ICD-10-CM

## 2018-02-06 DIAGNOSIS — Z131 Encounter for screening for diabetes mellitus: Secondary | ICD-10-CM

## 2018-02-06 DIAGNOSIS — O26893 Other specified pregnancy related conditions, third trimester: Secondary | ICD-10-CM | POA: Diagnosis not present

## 2018-02-06 DIAGNOSIS — Z3A28 28 weeks gestation of pregnancy: Secondary | ICD-10-CM

## 2018-02-06 DIAGNOSIS — R102 Pelvic and perineal pain: Principal | ICD-10-CM

## 2018-02-06 DIAGNOSIS — Z23 Encounter for immunization: Secondary | ICD-10-CM | POA: Diagnosis not present

## 2018-02-06 DIAGNOSIS — O26899 Other specified pregnancy related conditions, unspecified trimester: Secondary | ICD-10-CM

## 2018-02-06 DIAGNOSIS — Z113 Encounter for screening for infections with a predominantly sexual mode of transmission: Secondary | ICD-10-CM

## 2018-02-06 DIAGNOSIS — Z3493 Encounter for supervision of normal pregnancy, unspecified, third trimester: Secondary | ICD-10-CM

## 2018-02-06 LAB — POCT URINALYSIS DIPSTICK OB
BILIRUBIN UA: NEGATIVE
GLUCOSE, UA: NEGATIVE
Ketones, UA: NEGATIVE
LEUKOCYTES UA: NEGATIVE
Nitrite, UA: NEGATIVE
PH UA: 6 (ref 5.0–8.0)
POC,PROTEIN,UA: NEGATIVE
RBC UA: NEGATIVE
SPEC GRAV UA: 1.02 (ref 1.010–1.025)
UROBILINOGEN UA: 0.2 U/dL

## 2018-02-06 MED ORDER — TETANUS-DIPHTH-ACELL PERTUSSIS 5-2.5-18.5 LF-MCG/0.5 IM SUSP
0.5000 mL | Freq: Once | INTRAMUSCULAR | Status: AC
  Administered 2018-02-06: 0.5 mL via INTRAMUSCULAR

## 2018-02-06 NOTE — Progress Notes (Signed)
ROB-Reports relief of reflux symptoms since starting Protonix. Enjoys PT. 28 week labs today. Follow up ultrasound-resolved LLP, breech presentation; findings reviewed with patient and family member, verbalized understanding. Desire repeat c-section with bilateral tubal ligation with Dr. Valentino Saxon as surgeon on 04/24/18 at 0730. Plans formula feeding. Blood transfusion consent reviewed and signed. TDaP given. Anticipatory guidance regarding course of prenatal care. Reviewed red flag symptoms and when to call. RTC x 2 weeks for consultation with Dr. Valentino Saxon to schedule surgical birth. RTC x 4 weeks for ROB with MICHELLE or sooner if needed.    ULTRASOUND REPORT  Location: [facility] Date of Service: 02/06/2018   Indications:growth/afi Findings:  Mason Jim intrauterine pregnancy is visualized with FHR at 144 BPM. Biometrics give an (U/S) Gestational age of [redacted]w[redacted]d and an (U/S) EDD of 04/26/18; this correlates with the clinically established Estimated Date of Delivery: 04/28/18.  Fetal presentation is Breech.  Placenta: posterior. Grade: 1 AFI: 10.3 cm  Growth percentile is 43. EFW: 2lb9oz/1165g (document both grams and pounds)  Impression: 1. [redacted]w[redacted]d Viable Singleton Intrauterine pregnancy previously established criteria. 2. Growth is 43 %ile.  AFI is 10.3 cm.  3 Placenta to cervical IO = 8cm  Recommendations:  1.Clinical correlation with the patient's History and Physical Exam.

## 2018-02-06 NOTE — Patient Instructions (Addendum)
WE WOULD LOVE TO HEAR FROM YOU!!!!   Thank you Mary Parsons for visiting [facility].  Providing our patients with the best experience possible is really important to [place], and we hope that you felt that on your recent visit. The most valuable feedback we get comes from YOU!!    If you receive a survey please take a couple of minutes to let us know how we did.Thank you for continuing to trust [place] with your care.   [facility]   Round Ligament Pain  The round ligament is a cord of muscle and tissue that helps support the uterus. It can become a source of pain during pregnancy if it becomes stretched or twisted as the baby grows. The pain usually begins in the second trimester (13-28 weeks) of pregnancy, and it can come and go until the baby is delivered. It is not a serious problem, and it does not cause harm to the baby. Round ligament pain is usually a short, sharp, and pinching pain, but it can also be a dull, lingering, and aching pain. The pain is felt in the lower side of the abdomen or in the groin. It usually starts deep in the groin and moves up to the outside of the hip area. The pain may occur when you:  Suddenly change position, such as quickly going from a sitting to standing position.  Roll over in bed.  Cough or sneeze.  Do physical activity. Follow these instructions at home:   Watch your condition for any changes.  When the pain starts, relax. Then try any of these methods to help with the pain: ? Sitting down. ? Flexing your knees up to your abdomen. ? Lying on your side with one pillow under your abdomen and another pillow between your legs. ? Sitting in a warm bath for 15-20 minutes or until the pain goes away.  Take over-the-counter and prescription medicines only as told by your health care provider.  Move slowly when you sit down or stand up.  Avoid long walks if they cause pain.  Stop or reduce your physical activities if  they cause pain.  Keep all follow-up visits as told by your health care provider. This is important. Contact a health care provider if:  Your pain does not go away with treatment.  You feel pain in your back that you did not have before.  Your medicine is not helping. Get help right away if:  You have a fever or chills.  You develop uterine contractions.  You have vaginal bleeding.  You have nausea or vomiting.  You have diarrhea.  You have pain when you urinate. Summary  Round ligament pain is felt in the lower abdomen or groin. It is usually a short, sharp, and pinching pain. It can also be a dull, lingering, and aching pain.  This pain usually begins in the second trimester (13-28 weeks). It occurs because the uterus is stretching with the growing baby, and it is not harmful to the baby.  You may notice the pain when you suddenly change position, when you cough or sneeze, or during physical activity.  Relaxing, flexing your knees to your abdomen, lying on one side, or taking a warm bath may help to get rid of the pain.  Get help from your health care provider if the pain does not go away or if you have vaginal bleeding, nausea, vomiting, diarrhea, or painful urination. This information is not intended  to replace advice given to you by your health care provider. Make sure you discuss any questions you have with your health care provider. Document Released: 10/13/2007 Document Revised: 06/21/2017 Document Reviewed: 06/21/2017 Elsevier Interactive Patient Education  2019 [employer]. Back Pain in Pregnancy Back pain during pregnancy is common. Back pain may be caused by several factors that are related to changes during your pregnancy. Follow these instructions at home: Managing pain, stiffness, and swelling      If directed, for sudden (acute) back pain, put ice on the painful area. ? Put ice in a plastic bag. ? Place a towel between your skin and the bag. ? Leave  the ice on for 20 minutes, 2-3 times per day.  If directed, apply heat to the affected area before you exercise. Use the heat source that your health care provider recommends, such as a moist heat pack or a heating pad. ? Place a towel between your skin and the heat source. ? Leave the heat on for 20-30 minutes. ? Remove the heat if your skin turns bright red. This is especially important if you are unable to feel pain, heat, or cold. You may have a greater risk of getting burned.  If directed, massage the affected area. Activity  Exercise as told by your health care provider. Gentle exercise is the best way to prevent or manage back pain.  Listen to your body when lifting. If lifting hurts, ask for help or bend your knees. This uses your leg muscles instead of your back muscles.  Squat down when picking up something from the floor. Do not bend over.  Only use bed rest for short periods as told by your health care provider. Bed rest should only be used for the most severe episodes of back pain. Standing, sitting, and lying down  Do not stand in one place for long periods of time.  Use good posture when sitting. Make sure your head rests over your shoulders and is not hanging forward. Use a pillow on your lower back if necessary.  Try sleeping on your side, preferably the left side, with a pregnancy support pillow or 1-2 regular pillows between your legs. ? If you have back pain after a night's rest, your bed may be too soft. ? A firm mattress may provide more support for your back during pregnancy. General instructions  Do not wear high heels.  Eat a healthy diet. Try to gain weight within your health care provider's recommendations.  Use a maternity girdle, elastic sling, or back brace as told by your health care provider.  Take over-the-counter and prescription medicines only as told by your health care provider.  Work with a physical therapist or massage therapist to find ways  to manage back pain. Acupuncture or massage therapy may be helpful.  Keep all follow-up visits as told by your health care provider. This is important. Contact a health care provider if:  Your back pain interferes with your daily activities.  You have increasing pain in other parts of your body. Get help right away if:  You develop numbness, tingling, weakness, or problems with the use of your arms or legs.  You develop severe back pain that is not controlled with medicine.  You have a change in bowel or bladder control.  You develop shortness of breath, dizziness, or you faint.  You develop nausea, vomiting, or sweating.  You have back pain that is a rhythmic, cramping pain similar to labor pains. Labor pain  is usually 1-2 minutes apart, lasts for about 1 minute, and involves a bearing down feeling or pressure in your pelvis.  You have back pain and your water breaks or you have vaginal bleeding.  You have back pain or numbness that travels down your leg.  Your back pain developed after you fell.  You develop pain on one side of your back.  You see blood in your urine.  You develop skin blisters in the area of your back pain. Summary  Back pain may be caused by several factors that are related to changes during your pregnancy.  Follow instructions as told by your health care provider for managing pain, stiffness, and swelling.  Exercise as told by your health care provider. Gentle exercise is the best way to prevent or manage back pain.  Take over-the-counter and prescription medicines only as told by your health care provider.  Keep all follow-up visits as told by your health care provider. This is important. This information is not intended to replace advice given to you by your health care provider. Make sure you discuss any questions you have with your health care provider. Document Released: 04/13/2005 Document Revised: 06/21/2017 Document Reviewed:  06/21/2017 Elsevier Interactive Patient Education  2019 Elsevier Inc. WHAT OB PATIENTS CAN EXPECT   Confirmation of pregnancy and ultrasound ordered if medically indicated-[redacted] weeks gestation  New OB (NOB) intake with nurse and New OB (NOB) labs- [redacted] weeks gestation  New OB (NOB) physical examination with provider- 11/[redacted] weeks gestation  Flu vaccine-[redacted] weeks gestation  Anatomy scan-[redacted] weeks gestation  Glucose tolerance test, blood work to test for anemia, T-dap vaccine-[redacted] weeks gestation  Vaginal swabs/cultures-STD/Group B strep-[redacted] weeks gestation  Appointments every 4 weeks until 28 weeks  Every 2 weeks from 28 weeks until 36 weeks  Weekly visits from 36 weeks until delivery

## 2018-02-06 NOTE — Progress Notes (Signed)
ROB, no complaints.  

## 2018-02-07 LAB — CBC
Hematocrit: 33.9 % — ABNORMAL LOW (ref 34.0–46.6)
Hemoglobin: 12.3 g/dL (ref 11.1–15.9)
MCH: 31.9 pg (ref 26.6–33.0)
MCHC: 36.3 g/dL — ABNORMAL HIGH (ref 31.5–35.7)
MCV: 88 fL (ref 79–97)
PLATELETS: 187 10*3/uL (ref 150–450)
RBC: 3.86 x10E6/uL (ref 3.77–5.28)
RDW: 11.9 % (ref 11.7–15.4)
WBC: 5.9 10*3/uL (ref 3.4–10.8)

## 2018-02-07 LAB — RPR: RPR: NONREACTIVE

## 2018-02-07 LAB — GLUCOSE, 1 HOUR GESTATIONAL: Gestational Diabetes Screen: 107 mg/dL (ref 65–139)
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
## 2018-02-10 NOTE — Addendum Note (Signed)
Addended by: Mariane Masters on: 02/10/2018 05:54 PM   Modules accepted: Orders
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
## 2018-02-12 ENCOUNTER — Ambulatory Visit: Payer: Commercial Managed Care - HMO | Admitting: Physical Therapy

## 2018-02-12 DIAGNOSIS — R278 Other lack of coordination: Secondary | ICD-10-CM

## 2018-02-12 DIAGNOSIS — M542 Cervicalgia: Secondary | ICD-10-CM

## 2018-02-12 DIAGNOSIS — R293 Abnormal posture: Secondary | ICD-10-CM

## 2018-02-12 DIAGNOSIS — L905 Scar conditions and fibrosis of skin: Secondary | ICD-10-CM

## 2018-02-12 DIAGNOSIS — R2689 Other abnormalities of gait and mobility: Secondary | ICD-10-CM

## 2018-02-12 NOTE — Patient Instructions (Signed)
Quad stretch in sidelying  Hip flexor stretch with knee bents, placing palm over scar to provide stretch
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
## 2018-02-19 ENCOUNTER — Encounter: Payer: 59 | Admitting: Physical Therapy

## 2018-02-22 ENCOUNTER — Ambulatory Visit (INDEPENDENT_AMBULATORY_CARE_PROVIDER_SITE_OTHER): Payer: 59 | Admitting: Obstetrics and Gynecology

## 2018-02-22 VITALS — BP 112/72 | HR 86 | Wt 168.1 lb

## 2018-02-22 DIAGNOSIS — Z98891 History of uterine scar from previous surgery: Secondary | ICD-10-CM

## 2018-02-22 DIAGNOSIS — Z3483 Encounter for supervision of other normal pregnancy, third trimester: Secondary | ICD-10-CM

## 2018-02-22 DIAGNOSIS — O9989 Other specified diseases and conditions complicating pregnancy, childbirth and the puerperium: Secondary | ICD-10-CM

## 2018-02-22 DIAGNOSIS — M25476 Effusion, unspecified foot: Secondary | ICD-10-CM

## 2018-02-22 DIAGNOSIS — O34219 Maternal care for unspecified type scar from previous cesarean delivery: Secondary | ICD-10-CM

## 2018-02-22 DIAGNOSIS — M25473 Effusion, unspecified ankle: Secondary | ICD-10-CM

## 2018-02-22 DIAGNOSIS — M25475 Effusion, left foot: Secondary | ICD-10-CM

## 2018-02-22 LAB — POCT URINALYSIS DIPSTICK OB
Bilirubin, UA: NEGATIVE
GLUCOSE, UA: NEGATIVE
Ketones, UA: NEGATIVE
LEUKOCYTES UA: NEGATIVE
Nitrite, UA: NEGATIVE
RBC UA: NEGATIVE
Spec Grav, UA: 1.01 (ref 1.010–1.025)
Urobilinogen, UA: 0.2 E.U./dL
pH, UA: 7.5 (ref 5.0–8.0)

## 2018-02-22 NOTE — Progress Notes (Signed)
   ROB-PT stated that she is having a lot of lower back and pelvic pain along with swollen feet. No other problems.

## 2018-02-22 NOTE — Progress Notes (Signed)
ROB: Patient presents from midwifery service for consultation. She is a 28 y.o. G3P1011 at [redacted]w[redacted]d with prior h/o C-section x 1 (for NRFHT) who desires repeat C-section.  Counseled regarding TOLAC vs RCS; risks/benefits discussed in detail.  VBAC calculated score is 70.7%.  All questions answered.  Patient continues to elects for repeat C-section.  Will schedule for desired date of 04/24/2018.  Patient also desires BTL with C-section. Has previously been counseled on all methods of contraception.  She does note some swelling in feet, discussed elevation when resting, and compression hose during the day.  RTC in 2 weeks with midwives.

## 2018-03-02 ENCOUNTER — Encounter: Payer: 59 | Admitting: Physical Therapy

## 2018-03-05 ENCOUNTER — Encounter: Payer: 59 | Admitting: Physical Therapy

## 2018-03-06 ENCOUNTER — Ambulatory Visit (INDEPENDENT_AMBULATORY_CARE_PROVIDER_SITE_OTHER): Payer: 59 | Admitting: Certified Nurse Midwife

## 2018-03-06 VITALS — BP 124/88 | HR 93 | Wt 168.6 lb

## 2018-03-06 DIAGNOSIS — Z3483 Encounter for supervision of other normal pregnancy, third trimester: Secondary | ICD-10-CM | POA: Diagnosis not present

## 2018-03-06 LAB — POCT URINALYSIS DIPSTICK OB
Bilirubin, UA: NEGATIVE
Glucose, UA: NEGATIVE
Ketones, UA: NEGATIVE
LEUKOCYTES UA: NEGATIVE
Nitrite, UA: NEGATIVE
RBC UA: NEGATIVE
SPEC GRAV UA: 1.025 (ref 1.010–1.025)
Urobilinogen, UA: 0.2 E.U./dL
pH, UA: 6.5 (ref 5.0–8.0)

## 2018-03-06 NOTE — Progress Notes (Signed)
ROB- Reports "racing heart and chest pain". Denies it increases with activity. Endorses some relief with protonix and milk. Notices it more at night when laying on her back. Reports symptoms exacerbated by thinking about everything she has to do. Endorses abdominal pain, back pain, and lower extremity edema. Believes these symptoms are related to fetus position and 8 hour work days. Encouraged three sisters of balance exercises and compression stockings to decrease abdominal pain and lower extremity edema. Endorses using abdominal support. Desires to try shorter work days to decrease symptoms. Note for work given. Handout for three sisters of balance given. Reviewed red flag symptoms and when to call. RTC in 2 weeks for ROB with Pattricia Boss.

## 2018-03-06 NOTE — Patient Instructions (Signed)
Breech Birth A breech birth is when a baby is born with the buttocks or feet first. Most babies are in a head down (vertex) position when they are born. There are three types of breech babies:  When the baby's buttocks are showing first in the vagina (birth canal) with the legs bent at the knees and the feet down near the buttocks (complete breech).  When the baby's buttocks are showing first in the birth canal with the legs straight up and the feet at the baby's head (frank breech).  When one or both of the baby's feet are showing first in the birth canal along with the buttocks (footling breech). What are the health risks of having a breech birth? Having a breech birth increases the health risks to your baby. A breech birth may cause the following:  Umbilical cord prolapse. This is when the umbilical cord enters the birth canal ahead of the baby, before or during labor. This can cause the cord to become pinched or compressed as labor continues. This can reduce the flow of blood and oxygen to the baby.  The baby getting stuck in the birth canal, which can cause injury or, rarely, death.  Injury to the baby's nerves in the shoulder, arm, and hand (brachial plexus injury) when delivered. What increases the risk of having a breech baby? It is not known what causes your baby to be breech. However, you are more likely to have a breech baby if:  You have had a previous pregnancy.  You are having more than one baby.  Your baby has certain birth (congenital) defects.  You have started your labor earlier than expected (premature labor).  You have problems with your uterus, such as a tumor or abnormally shaped uterus.  You have too much or not enough fluid surrounding the baby (amniotic fluid). How do I know if my baby is breech? There are no symptoms for you to know that your baby is breech. When you are close to your due date, your health care provider can tell if your baby is breech by  doing:  An abdominal or vaginal (pelvic) exam.  An ultrasound. What can be done if my baby is breech?  Your health care provider may try to turn the baby in your uterus. He or she will use a procedure called external cephalic version (ECV). He or she will place both hands on your abdomen and gently and slowly turn the baby around. It is important to know that ECV can increase your chances of suddenly going into labor. For this reason, an ECV is only done toward the end of a healthy pregnancy. The baby may remain in this position, but sometimes he or she may turn back to the breech position. You and your health care provider will discuss if an ECV is recommended for you and your baby. How will I delivery my baby if he or she is breech? You and your health care provider will discuss the best way to deliver your baby. If your baby is breech, it is less likely that a vaginal delivery will be recommended due to the risks to you and your baby. Your health care provider may recommend that you deliver your baby through a Cesarean section (C-section). A C-section is the surgical delivery of a baby through an incision in the abdomen and the uterus. Summary  A breech birth is when a baby is born with the buttocks or feet first.  Having a breech birth 67  increase the risks to your baby.  Your health care provider may try to turn your baby in your uterus using a procedure called an external cephalic version (ECV).  If your baby cannot be turned to a head down position or if your baby remains in a breech position, your health care provider will make recommendations about the safest way to deliver your baby. This information is not intended to replace advice given to you by your health care provider. Make sure you discuss any questions you have with your health care provider. Document Released: 02/24/2006 Document Revised: 09/29/2016 Document Reviewed: 09/29/2016 Elsevier Interactive Patient Education   2019 [employer]. Third Trimester of Pregnancy  The third trimester is from week 28 through week 40 (months 7 through 9). This trimester is when your unborn baby (fetus) is growing very fast. At the end of the ninth month, the unborn baby is about 20 inches in length. It weighs about 6-10 pounds. Follow these instructions at home: Medicines  Take over-the-counter and prescription medicines only as told by your doctor. Some medicines are safe and some medicines are not safe during pregnancy.  Take a prenatal vitamin that contains at least 600 micrograms (mcg) of folic acid.  If you have trouble pooping (constipation), take medicine that will make your stool soft (stool softener) if your doctor approves. Eating and drinking   Eat regular, healthy meals.  Avoid raw meat and uncooked cheese.  If you get low calcium from the food you eat, talk to your doctor about taking a daily calcium supplement.  Eat four or five small meals rather than three large meals a day.  Avoid foods that are high in fat and sugars, such as fried and sweet foods.  To prevent constipation: ? Eat foods that are high in fiber, like fresh fruits and vegetables, whole grains, and beans. ? Drink enough fluids to keep your pee (urine) clear or pale yellow. Activity  Exercise only as told by your doctor. Stop exercising if you start to have cramps.  Avoid heavy lifting, wear low heels, and sit up straight.  Do not exercise if it is too hot, too humid, or if you are in a place of great height (high altitude).  You may continue to have sex unless your doctor tells you not to. Relieving pain and discomfort  Wear a good support bra if your breasts are tender.  Take frequent breaks and rest with your legs raised if you have leg cramps or low back pain.  Take warm water baths (sitz baths) to soothe pain or discomfort caused by hemorrhoids. Use hemorrhoid cream if your doctor approves.  If you develop puffy,  bulging veins (varicose veins) in your legs: ? Wear support hose or compression stockings as told by your doctor. ? Raise (elevate) your feet for 15 minutes, 3-4 times a day. ? Limit salt in your food. Safety  Wear your seat belt when driving.  Make a list of emergency phone numbers, including numbers for family, friends, the hospital, and police and fire departments. Preparing for your baby's arrival To prepare for the arrival of your baby:  Take prenatal classes.  Practice driving to the hospital.  Visit the hospital and tour the maternity area.  Talk to your work about taking leave once the baby comes.  Pack your hospital bag.  Prepare the baby's room.  Go to your doctor visits.  Buy a rear-facing car seat. Learn how to install it in your car. General instructions  Do not use hot tubs, steam rooms, or saunas.  Do not use any products that contain nicotine or tobacco, such as cigarettes and e-cigarettes. If you need help quitting, ask your doctor.  Do not drink alcohol.  Do not douche or use tampons or scented sanitary pads.  Do not cross your legs for long periods of time.  Do not travel for long distances unless you must. Only do so if your doctor says it is okay.  Visit your dentist if you have not gone during your pregnancy. Use a soft toothbrush to brush your teeth. Be gentle when you floss.  Avoid cat litter boxes and soil used by cats. These carry germs that can cause birth defects in the baby and can cause a loss of your baby (miscarriage) or stillbirth.  Keep all your prenatal visits as told by your doctor. This is important. Contact a doctor if:  You are not sure if you are in labor or if your water has broken.  You are dizzy.  You have mild cramps or pressure in your lower belly.  You have a nagging pain in your belly area.  You continue to feel sick to your stomach, you throw up, or you have watery poop.  You have bad smelling fluid coming from  your vagina.  You have pain when you pee. Get help right away if:  You have a fever.  You are leaking fluid from your vagina.  You are spotting or bleeding from your vagina.  You have severe belly cramps or pain.  You lose or gain weight quickly.  You have trouble catching your breath and have chest pain.  You notice sudden or extreme puffiness (swelling) of your face, hands, ankles, feet, or legs.  You have not felt the baby move in over an hour.  You have severe headaches that do not go away with medicine.  You have trouble seeing.  You are leaking, or you are having a gush of fluid, from your vagina before you are 37 weeks.  You have regular belly spasms (contractions) before you are 37 weeks. Summary  The third trimester is from week 28 through week 40 (months 7 through 9). This time is when your unborn baby is growing very fast.  Follow your doctor's advice about medicine, food, and activity.  Get ready for the arrival of your baby by taking prenatal classes, getting all the baby items ready, preparing the baby's room, and visiting your doctor to be checked.  Get help right away if you are bleeding from your vagina, or you have chest pain and trouble catching your breath, or if you have not felt your baby move in over an hour. This information is not intended to replace advice given to you by your health care provider. Make sure you discuss any questions you have with your health care provider. Document Released: 03/30/2009 Document Revised: 02/09/2016 Document Reviewed: 02/09/2016 Elsevier Interactive Patient Education  2019 [employer].

## 2018-03-06 NOTE — Progress Notes (Signed)
ROB-pt stated that she is still having lower abd and back pain along with pelvic pain. Pt stated that she is having a lot of chest pain and can feel her heart beating really fast. Pt also stated that she is having some swelling in her feet.

## 2018-03-12 ENCOUNTER — Ambulatory Visit: Payer: 59 | Attending: Certified Nurse Midwife | Admitting: Physical Therapy

## 2018-03-12 ENCOUNTER — Encounter: Payer: Self-pay | Admitting: Certified Nurse Midwife

## 2018-03-19 ENCOUNTER — Encounter: Payer: 59 | Admitting: Physical Therapy

## 2018-03-20 ENCOUNTER — Ambulatory Visit (INDEPENDENT_AMBULATORY_CARE_PROVIDER_SITE_OTHER): Payer: 59 | Admitting: Certified Nurse Midwife

## 2018-03-20 VITALS — BP 123/68 | HR 75 | Wt 172.7 lb

## 2018-03-20 DIAGNOSIS — R109 Unspecified abdominal pain: Secondary | ICD-10-CM

## 2018-03-20 DIAGNOSIS — O34219 Maternal care for unspecified type scar from previous cesarean delivery: Secondary | ICD-10-CM

## 2018-03-20 DIAGNOSIS — Z98891 History of uterine scar from previous surgery: Secondary | ICD-10-CM

## 2018-03-20 DIAGNOSIS — O26893 Other specified pregnancy related conditions, third trimester: Secondary | ICD-10-CM

## 2018-03-20 DIAGNOSIS — Z3493 Encounter for supervision of normal pregnancy, unspecified, third trimester: Secondary | ICD-10-CM

## 2018-03-20 DIAGNOSIS — M549 Dorsalgia, unspecified: Secondary | ICD-10-CM

## 2018-03-20 DIAGNOSIS — O9989 Other specified diseases and conditions complicating pregnancy, childbirth and the puerperium: Secondary | ICD-10-CM

## 2018-03-20 DIAGNOSIS — O99891 Other specified diseases and conditions complicating pregnancy: Secondary | ICD-10-CM

## 2018-03-20 DIAGNOSIS — O26899 Other specified pregnancy related conditions, unspecified trimester: Secondary | ICD-10-CM

## 2018-03-20 LAB — POCT URINALYSIS DIPSTICK OB
Bilirubin, UA: NEGATIVE
Glucose, UA: NEGATIVE
Ketones, UA: NEGATIVE
Leukocytes, UA: NEGATIVE
NITRITE UA: NEGATIVE
RBC UA: NEGATIVE
SPEC GRAV UA: 1.01 (ref 1.010–1.025)
UROBILINOGEN UA: 0.2 U/dL
pH, UA: 6 (ref 5.0–8.0)

## 2018-03-20 MED ORDER — HYDROXYZINE HCL 25 MG PO TABS: 25.0000 mg | ORAL_TABLET | Freq: Four times a day (QID) | ORAL | 2 refills | Status: DC | PRN

## 2018-03-20 NOTE — Progress Notes (Signed)
ROB- pt is doing well 

## 2018-03-20 NOTE — Patient Instructions (Addendum)
WE WOULD LOVE TO HEAR FROM YOU!!!!   Thank you Mary Parsons for visiting [facility].  Providing our patients with the best experience possible is really important to [place], and we hope that you felt that on your recent visit. The most valuable feedback we get comes from YOU!!    If you receive a survey please take a couple of minutes to let us know how we did.Thank you for continuing to trust [place] with your care.   [facility]   Hydroxyzine capsules or tablets What is this medicine? HYDROXYZINE (hye DROX i zeen) is an antihistamine. This medicine is used to treat allergy symptoms. It is also used to treat anxiety and tension. This medicine can be used with other medicines to induce sleep before surgery. This medicine may be used for other purposes; ask your health care provider or pharmacist if you have questions. COMMON BRAND NAME(S): ANX, Atarax, Rezine, Vistaril What should I tell my health care provider before I take this medicine? They need to know if you have any of these conditions: -glaucoma -heart disease -history of irregular heartbeat -kidney disease -liver disease -lung or breathing disease, like asthma -stomach or intestine problems -thyroid disease -trouble passing urine -an unusual or allergic reaction to hydroxyzine, cetirizine, other medicines, foods, dyes or preservatives -pregnant or trying to get pregnant -breast-feeding How should I use this medicine? Take this medicine by mouth with a full glass of water. Follow the directions on the prescription label. You may take this medicine with food or on an empty stomach. Take your medicine at regular intervals. Do not take your medicine more often than directed. Talk to your pediatrician regarding the use of this medicine in children. Special care may be needed. While this drug may be prescribed for children as young as 50 years of age for selected conditions, precautions do  apply. Patients over 54 years old may have a stronger reaction and need a smaller dose. Overdosage: If you think you have taken too much of this medicine contact a poison control center or emergency room at once. NOTE: This medicine is only for you. Do not share this medicine with others. What if I miss a dose? If you miss a dose, take it as soon as you can. If it is almost time for your next dose, take only that dose. Do not take double or extra doses. What may interact with this medicine? Do not take this medicine with any of the following medications: -cisapride -dofetilide -dronedarone -pimozide -thioridazine This medicine may also interact with the following medications: -alcohol -antihistamines for allergy, cough, and cold -atropine -barbiturate medicines for sleep or seizures, like phenobarbital -certain antibiotics like erythromycin or clarithromycin -certain medicines for anxiety or sleep -certain medicines for bladder problems like oxybutynin, tolterodine -certain medicines for depression or psychotic disturbances -certain medicines for irregular heart beat -certain medicines for Parkinson's disease like benztropine, trihexyphenidyl -certain medicines for seizures like phenobarbital, primidone -certain medicines for stomach problems like dicyclomine, hyoscyamine -certain medicines for travel sickness like scopolamine -ipratropium -narcotic medicines for pain -other medicines that prolong the QT interval (an abnormal heart rhythm) This list may not describe all possible interactions. Give your health care provider a list of all the medicines, herbs, non-prescription drugs, or dietary supplements you use. Also tell them if you smoke, drink alcohol, or use illegal drugs. Some items may interact with your medicine. What should I watch for while using this medicine? Tell your doctor or health care professional if  your symptoms do not improve. You may get drowsy or dizzy. Do not  drive, use machinery, or do anything that needs mental alertness until you know how this medicine affects you. Do not stand or sit up quickly, especially if you are an older patient. This reduces the risk of dizzy or fainting spells. Alcohol may interfere with the effect of this medicine. Avoid alcoholic drinks. Your mouth may get dry. Chewing sugarless gum or sucking hard candy, and drinking plenty of water may help. Contact your doctor if the problem does not go away or is severe. This medicine may cause dry eyes and blurred vision. If you wear contact lenses you may feel some discomfort. Lubricating drops may help. See your eye doctor if the problem does not go away or is severe. If you are receiving skin tests for allergies, tell your doctor you are using this medicine. What side effects may I notice from receiving this medicine? Side effects that you should report to your doctor or health care professional as soon as possible: -allergic reactions like skin rash, itching or hives, swelling of the face, lips, or tongue -changes in vision -confusion -fast, irregular heartbeat -seizures -tremor -trouble passing urine or change in the amount of urine Side effects that usually do not require medical attention (report to your doctor or health care professional if they continue or are bothersome): -constipation -drowsiness -dry mouth -headache -tiredness This list may not describe all possible side effects. Call your doctor for medical advice about side effects. You may report side effects to FDA at 1-800-FDA-1088. Where should I keep my medicine? Keep out of the reach of children. Store at room temperature between 15 and 30 degrees C (59 and 86 degrees F). Keep container tightly closed. Throw away any unused medicine after the expiration date. NOTE: This sheet is a summary. It may not cover all possible information. If you have questions about this medicine, talk to your doctor, pharmacist, or  health care provider.  2019 Elsevier/Gold Standard (2017-07-17 13:25:13) Third Trimester of Pregnancy  The third trimester is from week 28 through week 40 (months 7 through 9). This trimester is when your unborn baby (fetus) is growing very fast. At the end of the ninth month, the unborn baby is about 20 inches in length. It weighs about 6-10 pounds. Follow these instructions at home: Medicines  Take over-the-counter and prescription medicines only as told by your doctor. Some medicines are safe and some medicines are not safe during pregnancy.  Take a prenatal vitamin that contains at least 600 micrograms (mcg) of folic acid.  If you have trouble pooping (constipation), take medicine that will make your stool soft (stool softener) if your doctor approves. Eating and drinking   Eat regular, healthy meals.  Avoid raw meat and uncooked cheese.  If you get low calcium from the food you eat, talk to your doctor about taking a daily calcium supplement.  Eat four or five small meals rather than three large meals a day.  Avoid foods that are high in fat and sugars, such as fried and sweet foods.  To prevent constipation: ? Eat foods that are high in fiber, like fresh fruits and vegetables, whole grains, and beans. ? Drink enough fluids to keep your pee (urine) clear or pale yellow. Activity  Exercise only as told by your doctor. Stop exercising if you start to have cramps.  Avoid heavy lifting, wear low heels, and sit up straight.  Do not exercise if it is  too hot, too humid, or if you are in a place of great height (high altitude).  You may continue to have sex unless your doctor tells you not to. Relieving pain and discomfort  Wear a good support bra if your breasts are tender.  Take frequent breaks and rest with your legs raised if you have leg cramps or low back pain.  Take warm water baths (sitz baths) to soothe pain or discomfort caused by hemorrhoids. Use hemorrhoid cream  if your doctor approves.  If you develop puffy, bulging veins (varicose veins) in your legs: ? Wear support hose or compression stockings as told by your doctor. ? Raise (elevate) your feet for 15 minutes, 3-4 times a day. ? Limit salt in your food. Safety  Wear your seat belt when driving.  Make a list of emergency phone numbers, including numbers for family, friends, the hospital, and police and fire departments. Preparing for your baby's arrival To prepare for the arrival of your baby:  Take prenatal classes.  Practice driving to the hospital.  Visit the hospital and tour the maternity area.  Talk to your work about taking leave once the baby comes.  Pack your hospital bag.  Prepare the baby's room.  Go to your doctor visits.  Buy a rear-facing car seat. Learn how to install it in your car. General instructions  Do not use hot tubs, steam rooms, or saunas.  Do not use any products that contain nicotine or tobacco, such as cigarettes and e-cigarettes. If you need help quitting, ask your doctor.  Do not drink alcohol.  Do not douche or use tampons or scented sanitary pads.  Do not cross your legs for long periods of time.  Do not travel for long distances unless you must. Only do so if your doctor says it is okay.  Visit your dentist if you have not gone during your pregnancy. Use a soft toothbrush to brush your teeth. Be gentle when you floss.  Avoid cat litter boxes and soil used by cats. These carry germs that can cause birth defects in the baby and can cause a loss of your baby (miscarriage) or stillbirth.  Keep all your prenatal visits as told by your doctor. This is important. Contact a doctor if:  You are not sure if you are in labor or if your water has broken.  You are dizzy.  You have mild cramps or pressure in your lower belly.  You have a nagging pain in your belly area.  You continue to feel sick to your stomach, you throw up, or you have watery  poop.  You have bad smelling fluid coming from your vagina.  You have pain when you pee. Get help right away if:  You have a fever.  You are leaking fluid from your vagina.  You are spotting or bleeding from your vagina.  You have severe belly cramps or pain.  You lose or gain weight quickly.  You have trouble catching your breath and have chest pain.  You notice sudden or extreme puffiness (swelling) of your face, hands, ankles, feet, or legs.  You have not felt the baby move in over an hour.  You have severe headaches that do not go away with medicine.  You have trouble seeing.  You are leaking, or you are having a gush of fluid, from your vagina before you are 37 weeks.  You have regular belly spasms (contractions) before you are 37 weeks. Summary  The third trimester  is from week 28 through week 40 (months 7 through 9). This time is when your unborn baby is growing very fast.  Follow your doctor's advice about medicine, food, and activity.  Get ready for the arrival of your baby by taking prenatal classes, getting all the baby items ready, preparing the baby's room, and visiting your doctor to be checked.  Get help right away if you are bleeding from your vagina, or you have chest pain and trouble catching your breath, or if you have not felt your baby move in over an hour. This information is not intended to replace advice given to you by your health care provider. Make sure you discuss any questions you have with your health care provider. Document Released: 03/30/2009 Document Revised: 02/09/2016 Document Reviewed: 02/09/2016 Elsevier Interactive Patient Education  2019 [employer].

## 2018-03-20 NOTE — Progress Notes (Signed)
ROB-Reports occasional contractions and back pain. Discussed home treatment measures. Rx: Vistaril, see orders. Requests growth and position ultrasound, see orders. Anticipatory guidance regarding course of prenatal care. Reviewed red flag symptoms and when to call. RTC x 2 weeks for growth ultrasound, cultures and ROB.

## 2018-03-26 ENCOUNTER — Encounter: Payer: 59 | Admitting: Physical Therapy

## 2018-03-27 ENCOUNTER — Other Ambulatory Visit (HOSPITAL_COMMUNITY)
Admission: RE | Admit: 2018-03-27 | Discharge: 2018-03-27 | Disposition: A | Discharge status: Home / Self Care | Payer: 59 | Source: Ambulatory Visit | Attending: Certified Nurse Midwife | Admitting: Certified Nurse Midwife

## 2018-03-27 ENCOUNTER — Ambulatory Visit (INDEPENDENT_AMBULATORY_CARE_PROVIDER_SITE_OTHER): Payer: 59 | Admitting: Certified Nurse Midwife

## 2018-03-27 VITALS — BP 121/78 | HR 103 | Wt 173.8 lb

## 2018-03-27 DIAGNOSIS — Z3493 Encounter for supervision of normal pregnancy, unspecified, third trimester: Secondary | ICD-10-CM | POA: Diagnosis present

## 2018-03-27 DIAGNOSIS — Z369 Encounter for antenatal screening, unspecified: Secondary | ICD-10-CM

## 2018-03-27 DIAGNOSIS — R102 Pelvic and perineal pain: Secondary | ICD-10-CM | POA: Insufficient documentation

## 2018-03-27 DIAGNOSIS — O26899 Other specified pregnancy related conditions, unspecified trimester: Secondary | ICD-10-CM | POA: Diagnosis present

## 2018-03-27 DIAGNOSIS — Z3A35 35 weeks gestation of pregnancy: Secondary | ICD-10-CM

## 2018-03-27 DIAGNOSIS — O26893 Other specified pregnancy related conditions, third trimester: Secondary | ICD-10-CM

## 2018-03-27 LAB — POCT URINALYSIS DIPSTICK OB
Bilirubin, UA: NEGATIVE
Blood, UA: NEGATIVE
Ketones, UA: 40
LEUKOCYTES UA: NEGATIVE
Nitrite, UA: NEGATIVE
POC,PROTEIN,UA: NEGATIVE
Spec Grav, UA: 1.01 (ref 1.010–1.025)
Urobilinogen, UA: 0.2 E.U./dL
pH, UA: 6.5 (ref 5.0–8.0)

## 2018-03-27 MED ORDER — CYCLOBENZAPRINE HCL 10 MG PO TABS: 10.0000 mg | ORAL_TABLET | Freq: Three times a day (TID) | ORAL | 0 refills | Status: DC | PRN

## 2018-03-27 NOTE — Progress Notes (Signed)
Subjective:   Mary Parsons is a 28 y.o. G3P1011 [redacted]w[redacted]d being seen today for problem obstetrical visit.  Patient reports back pain since Sunday, intermittent nausea, and increased pelvic pressure.  No relief with home treatment measures. Noticed increased thin vaginal discharge before today's appointment.   Denies contractions, vaginal bleeding or leaking of fluid.  Reports good fetal movement.  Accompanied by spouse.   The following portions of the patient's history were reviewed and updated as appropriate: allergies, current medications, past family history, past medical history, past social history, past surgical history and problem list.   Objective:   BP 121/78   Pulse (!) 103   Wt 173 lb 12.8 oz (78.8 kg)   LMP 07/22/2017   BMI 31.79 kg/m   FHT: Fetal Heart Rate (bpm): 135  Uterine Size: Fundal Height: 35 cm  Fetal Movement: Movement: Present  Presentation: Presentation: Vertex    Abdomen:  soft, gravid, appropriate for gestational age,non-tender  Vaginal:  Discharge, white and thin; vaginal swab collected   Cervix: Closed,Long,-3, firm,posterior     Results for orders placed or performed in visit on 03/27/18 (from the past 24 hour(s))  POC Urinalysis Dipstick OB     Status: Abnormal   Collection Time: 03/27/18  1:36 PM  Result Value Ref Range   Color, UA yellow    Clarity, UA clear    Glucose, UA Small (1+) (A) Negative   Bilirubin, UA neg    Ketones, UA 40    Spec Grav, UA 1.010 1.010 - 1.025   Blood, UA neg    pH, UA 6.5 5.0 - 8.0   POC,PROTEIN,UA Negative Negative, Trace, Small (1+), Moderate (2+), Large (3+), 4+   Urobilinogen, UA 0.2 0.2 or 1.0 E.U./dL   Nitrite, UA neg    Leukocytes, UA Negative Negative   Appearance     Odor     Assessment:   Pregnancy:  G3P1011 at [redacted]w[redacted]d  1. Third trimester pregnancy  - POC Urinalysis Dipstick OB - Urine Culture - Cervicovaginal ancillary only  2. Pelvic pressure in pregnancy  - Urine Culture -  Cervicovaginal ancillary only  3. Antenatal screening encounter  - Cervicovaginal ancillary only   Plan:   Vaginal swab collected, will contact patient with results  Negative nitrazine and pooling on speculum exam  Rx: Flexeril, see orders  Discussed home treatment measures including use of V2 supporter; handouts given  Anticipatory guidance regarding course of prenatal care  Preterm labor symptoms: vaginal bleeding, contractions and leaking of fluid reviewed in detail.  Fetal movement precautions reviewed.  Follow up as previously scheduled or sooner if needed   Gunnar Bulla, CNM [facility], [facility]

## 2018-03-27 NOTE — Patient Instructions (Signed)
WE WOULD LOVE TO HEAR FROM YOU!!!!   Thank you Mary Parsons for visiting [facility].  Providing our patients with the best experience possible is really important to [place], and we hope that you felt that on your recent visit. The most valuable feedback we get comes from YOU!!    If you receive a survey please take a couple of minutes to let us know how we did.Thank you for continuing to trust [place] with your care.   [facility]   Preventing Preterm Birth Preterm birth is when your baby is delivered between 20 weeks and 37 weeks of pregnancy. A full-term pregnancy lasts for at least 37 weeks. Preterm birth can be dangerous for your baby because the last few weeks of pregnancy are an important time for your baby's brain and lungs to grow. Many things can cause a baby to be born early. Sometimes the cause is not known. There are certain factors that make you more likely to experience preterm birth, such as:  Having a previous baby born preterm.  Being pregnant with twins or other multiples.  Having had fertility treatment.  Being overweight or underweight at the start of your pregnancy.  Having any of the following during pregnancy: ? An infection, including a urinary tract infection (UTI) or an STI (sexually transmitted infection). ? High blood pressure. ? Diabetes. ? Vaginal bleeding.  Being age 59 or older.  Being age 19 or younger.  Getting pregnant within 6 months of a previous pregnancy.  Suffering extreme stress or physical or emotional abuse during pregnancy.  Standing for long periods of time during pregnancy, such as working at a job that requires standing. What are the risks? The most serious risk of preterm birth is that the baby may not survive. This is more likely to happen if a baby is born before 34 weeks. Other risks and complications of preterm birth may include your baby having:  Breathing problems.  Brain damage that  affects movement and coordination (cerebral palsy).  Feeding difficulties.  Vision or hearing problems.  Infections or inflammation of the digestive tract (colitis).  Developmental delays.  Learning disabilities.  Higher risk for diabetes, heart disease, and high blood pressure later in life. What can I do to lower my risk?  Medical care The most important thing you can do to lower your risk for preterm birth is to get routine medical care during pregnancy (prenatal care). If you have a high risk of preterm birth, you may be referred to a health care provider who specializes in managing high-risk pregnancies (perinatologist). You may be given medicine to help prevent preterm birth. Lifestyle changes Certain lifestyle changes can also lower your risk of preterm birth:  Wait at least 6 months after a pregnancy to become pregnant again.  Try to plan pregnancy for when you are between 23 and 45 years old.  Get to a healthy weight before getting pregnant. If you are overweight, work with your health care provider to safely lose weight.  Do not use any products that contain nicotine or tobacco, such as cigarettes and e-cigarettes. If you need help quitting, ask your health care provider.  Do not drink alcohol.  Do not use drugs. Where to find support For more support, consider:  Talking with your health care provider.  Talking with a therapist or substance abuse counselor, if you need help quitting.  Working with a diet and nutrition specialist (dietitian) or a Systems analyst to maintain a healthy  weight.  Joining a support group. Where to find more information Learn more about preventing preterm birth from:  Centers for Disease Control and Prevention: http://curry.org/  March of Dimes: marchofdimes.org/complications/premature-babies.aspx  American Pregnancy Association:  americanpregnancy.org/labor-and-birth/premature-labor Contact a health care provider if:  You have any of the following signs of preterm labor before 37 weeks: ? A change or increase in vaginal discharge. ? Fluid leaking from your vagina. ? Pressure or cramps in your lower abdomen. ? A backache that does not go away or gets worse. ? Regular tightening (contractions) in your lower abdomen. Summary  Preterm birth means having your baby during weeks 20-37 of pregnancy.  Preterm birth may put your baby at risk for physical and mental problems.  Getting good prenatal care can help prevent preterm birth.  You can lower your risk of preterm birth by making certain lifestyle changes, such as not smoking and not using alcohol. This information is not intended to replace advice given to you by your health care provider. Make sure you discuss any questions you have with your health care provider. Document Released: 02/17/2015 Document Revised: 09/12/2015 Document Reviewed: 09/12/2015 Elsevier Interactive Patient Education  2019 Elsevier Inc.   [employer] of the uterus can occur throughout pregnancy, but they are not always a sign that you are in labor. You may have practice contractions called Braxton Hicks contractions. These false labor contractions are sometimes confused with true labor. What are Deberah Pelton contractions? Braxton Hicks contractions are tightening movements that occur in the muscles of the uterus before labor. Unlike true labor contractions, these contractions do not result in opening (dilation) and thinning of the cervix. Toward the end of pregnancy (32-34 weeks), Braxton Hicks contractions can happen more often and may become stronger. These contractions are sometimes difficult to tell apart from true labor because they can be very uncomfortable. You should not feel embarrassed if you go to the hospital with false labor. Sometimes, the only way to  tell if you are in true labor is for your health care provider to look for changes in the cervix. The health care provider will do a physical exam and may monitor your contractions. If you are not in true labor, the exam should show that your cervix is not dilating and your water has not broken. If there are no other health problems associated with your pregnancy, it is completely safe for you to be sent home with false labor. You may continue to have Braxton Hicks contractions until you go into true labor. How to tell the difference between true labor and false labor True labor  Contractions last 30-70 seconds.  Contractions become very regular.  Discomfort is usually felt in the top of the uterus, and it spreads to the lower abdomen and low back.  Contractions do not go away with walking.  Contractions usually become more intense and increase in frequency.  The cervix dilates and gets thinner. False labor  Contractions are usually shorter and not as strong as true labor contractions.  Contractions are usually irregular.  Contractions are often felt in the front of the lower abdomen and in the groin.  Contractions may go away when you walk around or change positions while lying down.  Contractions get weaker and are shorter-lasting as time goes on.  The cervix usually does not dilate or become thin. Follow these instructions at home:   Take over-the-counter and prescription medicines only as told by your health care provider.  Keep up with  your usual exercises and follow other instructions from your health care provider.  Eat and drink lightly if you think you are going into labor.  If Braxton Hicks contractions are making you uncomfortable: ? Change your position from lying down or resting to walking, or change from walking to resting. ? Sit and rest in a tub of warm water. ? Drink enough fluid to keep your urine pale yellow. Dehydration may cause these contractions. ? Do  slow and deep breathing several times an hour.  Keep all follow-up prenatal visits as told by your health care provider. This is important. Contact a health care provider if:  You have a fever.  You have continuous pain in your abdomen. Get help right away if:  Your contractions become stronger, more regular, and closer together.  You have fluid leaking or gushing from your vagina.  You pass blood-tinged mucus (bloody show).  You have bleeding from your vagina.  You have low back pain that you never had before.  You feel your baby's head pushing down and causing pelvic pressure.  Your baby is not moving inside you as much as it used to. Summary  Contractions that occur before labor are called Braxton Hicks contractions, false labor, or practice contractions.  Braxton Hicks contractions are usually shorter, weaker, farther apart, and less regular than true labor contractions. True labor contractions usually become progressively stronger and regular, and they become more frequent.  Manage discomfort from [facility] contractions by changing position, resting in a warm bath, drinking plenty of water, or practicing deep breathing. This information is not intended to replace advice given to you by your health care provider. Make sure you discuss any questions you have with your health care provider. Document Released: 05/19/2016 Document Revised: 10/18/2016 Document Reviewed: 05/19/2016 Elsevier Interactive Patient Education  2019 Elsevier Inc.   Back Pain in Pregnancy Back pain during pregnancy is common. Back pain may be caused by several factors that are related to changes during your pregnancy. Follow these instructions at home: Managing pain, stiffness, and swelling      If directed, for sudden (acute) back pain, put ice on the painful area. ? Put ice in a plastic bag. ? Place a towel between your skin and the bag. ? Leave the ice on for 20 minutes, 2-3 times per  day.  If directed, apply heat to the affected area before you exercise. Use the heat source that your health care provider recommends, such as a moist heat pack or a heating pad. ? Place a towel between your skin and the heat source. ? Leave the heat on for 20-30 minutes. ? Remove the heat if your skin turns bright red. This is especially important if you are unable to feel pain, heat, or cold. You may have a greater risk of getting burned.  If directed, massage the affected area. Activity  Exercise as told by your health care provider. Gentle exercise is the best way to prevent or manage back pain.  Listen to your body when lifting. If lifting hurts, ask for help or bend your knees. This uses your leg muscles instead of your back muscles.  Squat down when picking up something from the floor. Do not bend over.  Only use bed rest for short periods as told by your health care provider. Bed rest should only be used for the most severe episodes of back pain. Standing, sitting, and lying down  Do not stand in one place for long periods of  time.  Use good posture when sitting. Make sure your head rests over your shoulders and is not hanging forward. Use a pillow on your lower back if necessary.  Try sleeping on your side, preferably the left side, with a pregnancy support pillow or 1-2 regular pillows between your legs. ? If you have back pain after a night's rest, your bed may be too soft. ? A firm mattress may provide more support for your back during pregnancy. General instructions  Do not wear high heels.  Eat a healthy diet. Try to gain weight within your health care provider's recommendations.  Use a maternity girdle, elastic sling, or back brace as told by your health care provider.  Take over-the-counter and prescription medicines only as told by your health care provider.  Work with a physical therapist or massage therapist to find ways to manage back pain. Acupuncture or  massage therapy may be helpful.  Keep all follow-up visits as told by your health care provider. This is important. Contact a health care provider if:  Your back pain interferes with your daily activities.  You have increasing pain in other parts of your body. Get help right away if:  You develop numbness, tingling, weakness, or problems with the use of your arms or legs.  You develop severe back pain that is not controlled with medicine.  You have a change in bowel or bladder control.  You develop shortness of breath, dizziness, or you faint.  You develop nausea, vomiting, or sweating.  You have back pain that is a rhythmic, cramping pain similar to labor pains. Labor pain is usually 1-2 minutes apart, lasts for about 1 minute, and involves a bearing down feeling or pressure in your pelvis.  You have back pain and your water breaks or you have vaginal bleeding.  You have back pain or numbness that travels down your leg.  Your back pain developed after you fell.  You develop pain on one side of your back.  You see blood in your urine.  You develop skin blisters in the area of your back pain. Summary  Back pain may be caused by several factors that are related to changes during your pregnancy.  Follow instructions as told by your health care provider for managing pain, stiffness, and swelling.  Exercise as told by your health care provider. Gentle exercise is the best way to prevent or manage back pain.  Take over-the-counter and prescription medicines only as told by your health care provider.  Keep all follow-up visits as told by your health care provider. This is important. This information is not intended to replace advice given to you by your health care provider. Make sure you discuss any questions you have with your health care provider. Document Released: 04/13/2005 Document Revised: 06/21/2017 Document Reviewed: 06/21/2017 Elsevier Interactive Patient Education   2019 [employer]

## 2018-03-27 NOTE — Progress Notes (Signed)
OB WORK IN- pt is c/o low back pain, nausea, she is having a lot of pelvic pressure

## 2018-03-28 ENCOUNTER — Encounter: Payer: Self-pay | Admitting: Certified Nurse Midwife

## 2018-03-28 LAB — CERVICOVAGINAL ANCILLARY ONLY
Bacterial vaginitis: NEGATIVE
Candida vaginitis: NEGATIVE
Trichomonas: NEGATIVE

## 2018-04-02 ENCOUNTER — Encounter: Payer: 59 | Admitting: Physical Therapy

## 2018-04-02 ENCOUNTER — Ambulatory Visit
Admission: RE | Admit: 2018-04-02 | Discharge: 2018-04-02 | Disposition: A | Discharge status: Home / Self Care | Payer: Commercial Managed Care - HMO | Source: Ambulatory Visit | Attending: Certified Nurse Midwife | Admitting: Certified Nurse Midwife

## 2018-04-02 ENCOUNTER — Other Ambulatory Visit: Payer: Self-pay

## 2018-04-02 DIAGNOSIS — Z98891 History of uterine scar from previous surgery: Secondary | ICD-10-CM | POA: Diagnosis present

## 2018-04-02 DIAGNOSIS — M549 Dorsalgia, unspecified: Secondary | ICD-10-CM | POA: Diagnosis present

## 2018-04-02 DIAGNOSIS — O26899 Other specified pregnancy related conditions, unspecified trimester: Secondary | ICD-10-CM | POA: Diagnosis present

## 2018-04-02 DIAGNOSIS — O9989 Other specified diseases and conditions complicating pregnancy, childbirth and the puerperium: Secondary | ICD-10-CM | POA: Diagnosis present

## 2018-04-02 DIAGNOSIS — R109 Unspecified abdominal pain: Secondary | ICD-10-CM | POA: Insufficient documentation

## 2018-04-02 DIAGNOSIS — Z3493 Encounter for supervision of normal pregnancy, unspecified, third trimester: Secondary | ICD-10-CM | POA: Diagnosis present

## 2018-04-03 ENCOUNTER — Ambulatory Visit (INDEPENDENT_AMBULATORY_CARE_PROVIDER_SITE_OTHER): Payer: 59 | Admitting: Certified Nurse Midwife

## 2018-04-03 VITALS — BP 118/63 | HR 73 | Wt 175.0 lb

## 2018-04-03 DIAGNOSIS — Z3685 Encounter for antenatal screening for Streptococcus B: Secondary | ICD-10-CM

## 2018-04-03 DIAGNOSIS — Z113 Encounter for screening for infections with a predominantly sexual mode of transmission: Secondary | ICD-10-CM

## 2018-04-03 DIAGNOSIS — Z3493 Encounter for supervision of normal pregnancy, unspecified, third trimester: Secondary | ICD-10-CM

## 2018-04-03 LAB — POCT URINALYSIS DIPSTICK OB
Bilirubin, UA: NEGATIVE
Blood, UA: NEGATIVE
Glucose, UA: NEGATIVE
Ketones, UA: NEGATIVE
Leukocytes, UA: NEGATIVE
Nitrite, UA: NEGATIVE
POC,PROTEIN,UA: NEGATIVE
Spec Grav, UA: 1.01 (ref 1.010–1.025)
Urobilinogen, UA: 0.2 E.U./dL
pH, UA: 6 (ref 5.0–8.0)

## 2018-04-03 NOTE — Progress Notes (Signed)
ROB-Reports increased pelvic pressure. 36 week cultures collected. Discussed COVID-19 precautions and visitor restrictions. Anticipatory guidance regarding course of prenatal care. Reviewed red flag symptoms and when to cal.. RTC x 1 week for ROB.   EXAM: OBSTETRICAL ULTRASOUND >14 WKS  FINDINGS: Number of Fetuses: 1  Heart Rate:  152 bpm  Movement: Yes  Presentation: Cephalic  Previa: No  Placental Location: Posterior fundal  Amniotic Fluid (Subjective): Normal  Amniotic Fluid (Objective):  AFI = 10.1 cm (5%ile= 7.7 cm, 95%= 24.9 cm for 36 wks)  FETAL BIOMETRY  BPD: 8.52cm 34w 2d  HC:   31.95cm 36w 0d  AC:   32.54cm 36w 3d  FL:   6.64cm 34w 1d  Current Mean GA: 35w 0d [place] EDC: 05/07/2018  Assigned GA:  2718w 33d Assigned EDC: 04/28/2018  Estimated Fetal Weight:  2718g 33%ile  FETAL ANATOMY  Lateral Ventricles: Appears normal  Thalami/CSP: Appears normal  Posterior Fossa:  Not visualized  Nuchal Region: Not visualized   NFT= N/A > 20 WKS  Upper Lip: Appears normal  Spine: Appears normal  4 Chamber Heart on Left: Appears normal  LVOT: Appears normal  RVOT: Appears normal  Stomach on Left: Appears normal  3 Vessel Cord: Appears normal  Cord Insertion site: Appears normal  Kidneys: Appears normal  Bladder: Appears normal  Extremities: Appears normal  Sex: Female  Technically difficult due to: Advanced gestational age.  Maternal Findings:  Cervix:  Normal cervical length measuring 4.5 cm.  Cervix is closed.  IMPRESSION: Single live intrauterine pregnancy as described above.

## 2018-04-03 NOTE — Progress Notes (Signed)
ROB- cultures obtained, pt is having low back pain

## 2018-04-03 NOTE — Patient Instructions (Signed)

## 2018-04-04 ENCOUNTER — Telehealth: Payer: Self-pay | Admitting: Obstetrics and Gynecology

## 2018-04-04 NOTE — Telephone Encounter (Signed)
The patient states she had a gush of creamy fluid and then put on a pad and monitored for 30 minutes, and she called back to state that nothing had changed.  She did state she has had some swelling in her feet, but no headache, and no other fluid leaking.  The patient states she did not want to go to L&D today, please advise, thanks.

## 2018-04-05 ENCOUNTER — Encounter: Payer: Self-pay | Admitting: Certified Nurse Midwife

## 2018-04-05 LAB — GC/CHLAMYDIA PROBE AMP
Chlamydia trachomatis, NAA: NEGATIVE
Neisseria gonorrhoeae by PCR: NEGATIVE

## 2018-04-05 LAB — STREP GP B NAA: Strep Gp B NAA: NEGATIVE

## 2018-04-05 NOTE — Telephone Encounter (Signed)
Sent pt mychart message

## 2018-04-09 ENCOUNTER — Encounter: Payer: 59 | Admitting: Physical Therapy

## 2018-04-10 ENCOUNTER — Observation Stay
Admission: RE | Admit: 2018-04-10 | Discharge: 2018-04-10 | Disposition: A | Discharge status: Home / Self Care | Payer: 59 | Source: Ambulatory Visit | Attending: Certified Nurse Midwife | Admitting: Certified Nurse Midwife

## 2018-04-10 ENCOUNTER — Other Ambulatory Visit: Payer: Self-pay

## 2018-04-10 ENCOUNTER — Encounter: Payer: 59 | Admitting: Certified Nurse Midwife

## 2018-04-10 ENCOUNTER — Other Ambulatory Visit: Payer: 59

## 2018-04-10 DIAGNOSIS — Z3A37 37 weeks gestation of pregnancy: Secondary | ICD-10-CM

## 2018-04-10 DIAGNOSIS — Z79899 Other long term (current) drug therapy: Secondary | ICD-10-CM | POA: Insufficient documentation

## 2018-04-10 DIAGNOSIS — O471 False labor at or after 37 completed weeks of gestation: Secondary | ICD-10-CM | POA: Diagnosis not present

## 2018-04-10 MED ORDER — PANTOPRAZOLE SODIUM 20 MG PO TBEC: 20.0000 mg | DELAYED_RELEASE_TABLET | Freq: Every day | ORAL | 3 refills | Status: DC

## 2018-04-10 NOTE — OB Triage Note (Signed)
  L&D OB Triage Note  SUBJECTIVE Tierre Bellon is a 28 y.o. G33P1011 female at [redacted]w[redacted]d, EDD Estimated Date of Delivery: 04/28/18 who presented to triage with complaints of irregular contractions . States she has been having contractions x 2 wks but they felt a little stronger and wanted to be evaluated.   OB History  Gravida Para Term Preterm AB Living  3 1 1  0 1 1  SAB TAB Ectopic Multiple Live Births  1 0 0 0 1    # Outcome Date GA Lbr Len/2nd Weight Sex Delivery Anes PTL Lv  3 Current           2 SAB 11/18/16 [redacted]w[redacted]d         1 Term 08/21/15 [redacted]w[redacted]d  1878 g M CS-Unspec   LIV     Complications: Fetal Intolerance    Medications Prior to Admission  Medication Sig Dispense Refill Last Dose  . hydrOXYzine (ATARAX/VISTARIL) 25 MG tablet Take 1 tablet (25 mg total) by mouth every 6 (six) hours as needed (contractions). 30 tablet 2 Taking  . pantoprazole (PROTONIX) 20 MG tablet Take 1 tablet (20 mg total) by mouth daily. 30 tablet 3   . Prenatal Vit-Fe Fumarate-FA (PRENATAL MULTIVITAMIN) TABS tablet Take 1 tablet by mouth daily at 12 noon.   Taking  . cyclobenzaprine (FLEXERIL) 10 MG tablet Take 1 tablet (10 mg total) by mouth 3 (three) times daily as needed for muscle spasms. 15 tablet 0 Taking     OBJECTIVE  Nursing Evaluation:   Temp 97.8 F (36.6 C) (Oral)   Resp 16   Ht 5\' 2"  (1.575 m)   Wt 79.4 kg   LMP 07/22/2017   BMI 32.01 kg/m    Findings:   Irregular mild contractions   NST was performed and has been reviewed by me.  NST INTERPRETATION: Category I  Mode: External Baseline Rate (A): 135 bpm Variability: Moderate Accelerations: 15 x 15 Decelerations: None     Contraction Frequency (min): 4-8  SVE 1 cm high and posterior per RN exam  ASSESSMENT Impression:  1.  Pregnancy:  G3P1011 at [redacted]w[redacted]d , EDD Estimated Date of Delivery: 04/28/18 2.  NST:  Category I  PLAN 1. Reassurance given 2. Discharge home with standard labor precautions given to return to L&D or call the  office for problems. 3. Continue routine prenatal care.    Doreene Burke, CNM

## 2018-04-10 NOTE — Telephone Encounter (Signed)
Refill sent.

## 2018-04-10 NOTE — Progress Notes (Signed)
Coronavirus (COVID-19) Are you at risk?  Are you at risk for the Coronavirus (COVID-19)?  To be considered HIGH RISK for Coronavirus (COVID-19), you have to meet the following criteria:  . Traveled to [place], [place], [place], [place] or [place]; or in the [place] to [place], [place], [place], or [place]; and have fever, cough, and shortness of breath within the last 2 weeks of travel OR . Been in close contact with a person diagnosed with COVID-19 within the last 2 weeks and have fever, cough, and shortness of breath . IF YOU DO NOT MEET THESE CRITERIA, YOU ARE CONSIDERED LOW RISK FOR COVID-19.  What to do if you are HIGH RISK for COVID-19?  . If you are having a medical emergency, call 911. . Seek medical care right away. Before you go to a doctor's office, urgent care or emergency department, call ahead and tell them about your recent travel, contact with someone diagnosed with COVID-19, and your symptoms. You should receive instructions from your physician's office regarding next steps of care.  . When you arrive at healthcare provider, tell the healthcare staff immediately you have returned from visiting [place], [place], [place], [place] or [place]; or traveled in the [place] to [place], [place], [place], or [place]; in the last two weeks or you have been in close contact with a person diagnosed with COVID-19 in the last 2 weeks.   . Tell the health care staff about your symptoms: fever, cough and shortness of breath. . After you have been seen by a medical provider, you will be either: o Tested for (COVID-19) and discharged home on quarantine except to seek medical care if symptoms worsen, and asked to  - Stay home and avoid contact with others until you get your results (4-5 days)  - Avoid travel on public transportation if possible (such as bus, train, or airplane) or o Sent to the Emergency Department by EMS for evaluation, COVID-19 testing, and possible  admission depending on your condition and test results.  What to do if you are LOW RISK for COVID-19?  Reduce your risk of any infection by using the same precautions used for avoiding the common cold or flu:  . Wash your hands often with soap and warm water for at least 20 seconds.  If soap and water are not readily available, use an alcohol-based hand sanitizer with at least 60% alcohol.  . If coughing or sneezing, cover your mouth and nose by coughing or sneezing into the elbow areas of your shirt or coat, into a tissue or into your sleeve (not your hands). . Avoid shaking hands with others and consider head nods or verbal greetings only. . Avoid touching your eyes, nose, or mouth with unwashed hands.  . Avoid close contact with people who are sick. . Avoid places or events with large numbers of people in one location, like concerts or sporting events. . Carefully consider travel plans you have or are making. . If you are planning any travel outside or inside the [place], visit the CDC's Travelers' Health webpage for the latest health notices. . If you have some symptoms but not all symptoms, continue to monitor at home and seek medical attention if your symptoms worsen. . If you are having a medical emergency, call 911.  Spoke with pt she denies any sx.  Wilmot Quevedo, CMA   ADDITIONAL HEALTHCARE OPTIONS FOR PATIENTS  [facility] / e-Visit: [email or web address]           [facility]: [phone]  [facility]: [phone]                   [facility]: [phone]  

## 2018-04-10 NOTE — OB Triage Note (Signed)
Pt reports cramping/abdominal and back pain over last 2 weeks, worsening last night, rates discomfort 5-8/10, denies bleeding, LOF, reports positive fetal movement

## 2018-04-11 ENCOUNTER — Ambulatory Visit (INDEPENDENT_AMBULATORY_CARE_PROVIDER_SITE_OTHER): Payer: 59 | Admitting: Obstetrics and Gynecology

## 2018-04-11 VITALS — BP 121/78 | HR 92 | Wt 172.4 lb

## 2018-04-11 DIAGNOSIS — Z3493 Encounter for supervision of normal pregnancy, unspecified, third trimester: Secondary | ICD-10-CM

## 2018-04-11 LAB — POCT URINALYSIS DIPSTICK OB
BILIRUBIN UA: NEGATIVE
Blood, UA: NEGATIVE
Glucose, UA: NEGATIVE
Ketones, UA: NEGATIVE
Leukocytes, UA: NEGATIVE
Nitrite, UA: NEGATIVE
Spec Grav, UA: 1.015 (ref 1.010–1.025)
Urobilinogen, UA: 0.2 E.U./dL
pH, UA: 6 (ref 5.0–8.0)

## 2018-04-11 MED ORDER — PANTOPRAZOLE SODIUM 20 MG PO TBEC: 20.0000 mg | DELAYED_RELEASE_TABLET | Freq: Every day | ORAL | 3 refills | Status: DC

## 2018-04-11 NOTE — Progress Notes (Signed)
ROB- pt went to L/D yesterday, she is having a lot of contractions, ? Leaking water??

## 2018-04-11 NOTE — Progress Notes (Signed)
ROB- doing well, discussed LOF and typical symptoms.Ntz and fern negative

## 2018-04-13 ENCOUNTER — Encounter: Payer: 59 | Admitting: Certified Nurse Midwife

## 2018-04-16 ENCOUNTER — Encounter: Payer: Self-pay | Admitting: Certified Nurse Midwife

## 2018-04-16 ENCOUNTER — Encounter: Payer: 59 | Admitting: Physical Therapy

## 2018-04-17 ENCOUNTER — Telehealth: Payer: Self-pay | Admitting: Certified Nurse Midwife

## 2018-04-17 NOTE — Telephone Encounter (Signed)
Called pt she is very concerned about not having her support person for delivery,advised pt I would get back to her

## 2018-04-17 NOTE — Telephone Encounter (Signed)
The patient called and stated that she needs to speak with Amy or Melody. No other information was disclosed. Please advise.

## 2018-04-23 ENCOUNTER — Encounter: Payer: Self-pay | Admitting: Obstetrics and Gynecology

## 2018-04-23 ENCOUNTER — Other Ambulatory Visit: Payer: Self-pay

## 2018-04-23 ENCOUNTER — Other Ambulatory Visit: Payer: Self-pay | Admitting: Obstetrics and Gynecology

## 2018-04-23 ENCOUNTER — Encounter
Admission: RE | Admit: 2018-04-23 | Discharge: 2018-04-23 | Disposition: A | Discharge status: Home / Self Care | Payer: 59 | Source: Ambulatory Visit | Attending: Obstetrics and Gynecology | Admitting: Obstetrics and Gynecology

## 2018-04-23 ENCOUNTER — Encounter: Payer: 59 | Admitting: Physical Therapy

## 2018-04-23 DIAGNOSIS — Z01818 Encounter for other preprocedural examination: Secondary | ICD-10-CM | POA: Insufficient documentation

## 2018-04-23 HISTORY — DX: Gastro-esophageal reflux disease without esophagitis: K21.9

## 2018-04-23 LAB — TYPE AND SCREEN
ABO/RH(D): A POS
Antibody Screen: NEGATIVE
Extend sample reason: UNDETERMINED

## 2018-04-23 LAB — CBC
HCT: 36.5 % (ref 36.0–46.0)
Hemoglobin: 12.9 g/dL (ref 12.0–15.0)
MCH: 30.9 pg (ref 26.0–34.0)
MCHC: 35.3 g/dL (ref 30.0–36.0)
MCV: 87.5 fL (ref 80.0–100.0)
Platelets: 173 10*3/uL (ref 150–400)
RBC: 4.17 MIL/uL (ref 3.87–5.11)
RDW: 11.9 % (ref 11.5–15.5)
WBC: 6.7 10*3/uL (ref 4.0–10.5)
nRBC: 0 % (ref 0.0–0.2)

## 2018-04-23 LAB — RAPID HIV SCREEN (HIV 1/2 AB+AG)
HIV 1/2 Antibodies: NONREACTIVE
HIV-1 P24 Antigen - HIV24: NONREACTIVE

## 2018-04-23 MED ORDER — CEFAZOLIN SODIUM-DEXTROSE 2-4 GM/100ML-% IV SOLN
2.0000 g | INTRAVENOUS | Status: DC
  Filled 2018-04-23: qty 100

## 2018-04-23 NOTE — Patient Instructions (Addendum)
  Your procedure is scheduled on: Tuesday April 24, 2018- arrive at 9:00 am Report to Labor and Delivery (3rd floor) [facility]- enter through [employer] entrance with your 1 support person.   Remember: Instructions that are not followed completely may result in serious medical risk, up to and including death, or upon the discretion of your surgeon and anesthesiologist your surgery may need to be rescheduled.    __x__ 1. Do not eat food (including mints, candies, chewing gum) after midnight the night before your procedure. You may drink clear liquids up to 2 hours before you are scheduled to arrive at the hospital for your procedure.  Do not drink anything within 2 hours of your scheduled arrival to the hospital.  Approved clear liquids:  --Water or Apple juice without pulp  --Clear carbohydrate beverage such as Gatorade or Powerade  --Black Coffee or Clear Tea (No milk, no creamers, do not add anything to the coffee or tea)  FINISH YOUR ENSURE DRINK WITHIN 1 HOUR OF ARRIVAL TO HOSPITAL   On the morning of surgery brush your teeth with toothpaste and water.  You may rinse your mouth with mouthwash if you wish.  Do not swallow any toothpaste or mouthwash.   __x__ Use CHG Soap as directed on instruction sheet   Do not wear jewelry, make-up, hairpins, clips or nail polish.  Do not wear lotions, powders, deodorant, or perfumes.   Do not shave below the face/neck 48 hours prior to surgery.   Do not bring valuables to the hospital.    [facility] is not responsible for any belongings or valuables.    For patients admitted to the hospital, discharge time is determined by your treatment team.   __x__ Take these medicines on the morning of surgery:  1. Pantoprazole/Protonix   __x__ Avoid Anti-inflammatories such as Advil, Ibuprofen, Motrin, Aleve, Naproxen, Naprosyn, BC/Goodies powders or aspirin products. You may continue to take Tylenol and Celebrex.

## 2018-04-24 ENCOUNTER — Inpatient Hospital Stay: Payer: 59 | Admitting: Anesthesiology

## 2018-04-24 ENCOUNTER — Inpatient Hospital Stay
Admission: RE | Admit: 2018-04-24 | Discharge: 2018-04-26 | DRG: 785 | Disposition: A | Discharge status: Home / Self Care | Payer: 59 | Attending: Obstetrics and Gynecology | Admitting: Obstetrics and Gynecology

## 2018-04-24 ENCOUNTER — Encounter
Admission: RE | Disposition: A | Discharge status: Home / Self Care | Payer: Self-pay | Source: Home / Self Care | Attending: Obstetrics and Gynecology

## 2018-04-24 DIAGNOSIS — O9962 Diseases of the digestive system complicating childbirth: Secondary | ICD-10-CM | POA: Diagnosis present

## 2018-04-24 DIAGNOSIS — O34211 Maternal care for low transverse scar from previous cesarean delivery: Principal | ICD-10-CM | POA: Diagnosis present

## 2018-04-24 DIAGNOSIS — Z3A39 39 weeks gestation of pregnancy: Secondary | ICD-10-CM

## 2018-04-24 DIAGNOSIS — K219 Gastro-esophageal reflux disease without esophagitis: Secondary | ICD-10-CM | POA: Diagnosis present

## 2018-04-24 DIAGNOSIS — Z302 Encounter for sterilization: Secondary | ICD-10-CM

## 2018-04-24 DIAGNOSIS — O34219 Maternal care for unspecified type scar from previous cesarean delivery: Secondary | ICD-10-CM | POA: Diagnosis present

## 2018-04-24 LAB — RPR: RPR Ser Ql: NONREACTIVE

## 2018-04-24 SURGERY — Surgical Case
Anesthesia: Spinal | Laterality: Bilateral

## 2018-04-24 MED ORDER — TRAMADOL HCL 50 MG PO TABS: 50.0000 mg | ORAL_TABLET | Freq: Four times a day (QID) | ORAL | Status: DC | PRN

## 2018-04-24 MED ORDER — DIPHENHYDRAMINE HCL 25 MG PO CAPS: 25.0000 mg | ORAL_CAPSULE | ORAL | Status: DC | PRN

## 2018-04-24 MED ORDER — NALBUPHINE HCL 10 MG/ML IJ SOLN: 5.0000 mg | INTRAMUSCULAR | Status: DC | PRN

## 2018-04-24 MED ORDER — MEPERIDINE HCL 25 MG/ML IJ SOLN: 6.2500 mg | INTRAMUSCULAR | Status: DC | PRN

## 2018-04-24 MED ORDER — KETOROLAC TROMETHAMINE 30 MG/ML IJ SOLN
30.0000 mg | Freq: Four times a day (QID) | INTRAMUSCULAR | Status: AC
  Administered 2018-04-24: 20:00:00 30 mg via INTRAVENOUS
  Filled 2018-04-24 (×2): qty 1

## 2018-04-24 MED ORDER — IBUPROFEN 800 MG PO TABS
800.0000 mg | ORAL_TABLET | Freq: Four times a day (QID) | ORAL | Status: DC
  Administered 2018-04-25 – 2018-04-26 (×6): 800 mg via ORAL
  Filled 2018-04-24 (×5): qty 1

## 2018-04-24 MED ORDER — SENNOSIDES-DOCUSATE SODIUM 8.6-50 MG PO TABS
2.0000 | ORAL_TABLET | ORAL | Status: DC
  Administered 2018-04-26: 09:00:00 2 via ORAL
  Filled 2018-04-24 (×2): qty 2

## 2018-04-24 MED ORDER — PRENATAL MULTIVITAMIN CH
1.0000 | ORAL_TABLET | Freq: Every day | ORAL | Status: DC
  Filled 2018-04-24: qty 1

## 2018-04-24 MED ORDER — BUPIVACAINE IN DEXTROSE 0.75-8.25 % IT SOLN
INTRATHECAL | Status: DC | PRN
  Administered 2018-04-24: 1.6 mL via INTRATHECAL

## 2018-04-24 MED ORDER — DIPHENHYDRAMINE HCL 25 MG PO CAPS: 25.0000 mg | ORAL_CAPSULE | Freq: Four times a day (QID) | ORAL | Status: DC | PRN

## 2018-04-24 MED ORDER — SIMETHICONE 80 MG PO CHEW
80.0000 mg | CHEWABLE_TABLET | ORAL | Status: DC
  Administered 2018-04-25 – 2018-04-26 (×2): 80 mg via ORAL
  Filled 2018-04-24 (×2): qty 1

## 2018-04-24 MED ORDER — NALOXONE HCL 0.4 MG/ML IJ SOLN: 0.4000 mg | INTRAMUSCULAR | Status: DC | PRN

## 2018-04-24 MED ORDER — ACETAMINOPHEN 325 MG PO TABS
650.0000 mg | ORAL_TABLET | Freq: Four times a day (QID) | ORAL | Status: AC
  Administered 2018-04-24 – 2018-04-25 (×4): 650 mg via ORAL
  Filled 2018-04-24 (×3): qty 2

## 2018-04-24 MED ORDER — SODIUM CHLORIDE 0.9 % IV SOLN
INTRAVENOUS | Status: DC | PRN
  Administered 2018-04-24: 12:00:00 50 ug/min via INTRAVENOUS

## 2018-04-24 MED ORDER — MORPHINE SULFATE (PF) 0.5 MG/ML IJ SOLN
INTRAMUSCULAR | Status: DC | PRN
  Administered 2018-04-24: .1 mg via INTRATHECAL

## 2018-04-24 MED ORDER — COCONUT OIL OIL: 1.0000 "application " | TOPICAL_OIL | Status: DC | PRN

## 2018-04-24 MED ORDER — OXYCODONE-ACETAMINOPHEN 5-325 MG PO TABS: 2.0000 | ORAL_TABLET | ORAL | Status: DC | PRN

## 2018-04-24 MED ORDER — ONDANSETRON HCL 4 MG/2ML IJ SOLN: 4.0000 mg | Freq: Three times a day (TID) | INTRAMUSCULAR | Status: DC | PRN

## 2018-04-24 MED ORDER — MORPHINE SULFATE (PF) 0.5 MG/ML IJ SOLN
INTRAMUSCULAR | Status: AC
  Filled 2018-04-24: qty 10

## 2018-04-24 MED ORDER — BUPIVACAINE IN DEXTROSE 0.75-8.25 % IT SOLN
INTRATHECAL | Status: AC
  Filled 2018-04-24: qty 2

## 2018-04-24 MED ORDER — ACETAMINOPHEN 500 MG PO TABS: 1000.0000 mg | ORAL_TABLET | ORAL | Status: DC

## 2018-04-24 MED ORDER — NALBUPHINE HCL 10 MG/ML IJ SOLN: 5.0000 mg | Freq: Once | INTRAMUSCULAR | Status: DC | PRN

## 2018-04-24 MED ORDER — LACTATED RINGERS IV SOLN: Freq: Once | INTRAVENOUS | Status: DC

## 2018-04-24 MED ORDER — MENTHOL 3 MG MT LOZG
1.0000 | LOZENGE | OROMUCOSAL | Status: DC | PRN
  Filled 2018-04-24: qty 9

## 2018-04-24 MED ORDER — EPHEDRINE SULFATE 50 MG/ML IJ SOLN
INTRAMUSCULAR | Status: AC
  Filled 2018-04-24: qty 1

## 2018-04-24 MED ORDER — PHENYLEPHRINE HCL 10 MG/ML IJ SOLN
INTRAMUSCULAR | Status: AC
  Filled 2018-04-24: qty 1

## 2018-04-24 MED ORDER — DIPHENHYDRAMINE HCL 50 MG/ML IJ SOLN: 12.5000 mg | INTRAMUSCULAR | Status: DC | PRN

## 2018-04-24 MED ORDER — LACTATED RINGERS IV SOLN: INTRAVENOUS | Status: DC

## 2018-04-24 MED ORDER — ONDANSETRON HCL 4 MG/2ML IJ SOLN
INTRAMUSCULAR | Status: AC
  Filled 2018-04-24: qty 2

## 2018-04-24 MED ORDER — ACETAMINOPHEN 325 MG PO TABS
ORAL_TABLET | ORAL | Status: AC
  Filled 2018-04-24: qty 2

## 2018-04-24 MED ORDER — SODIUM CHLORIDE 0.9% FLUSH: 3.0000 mL | INTRAVENOUS | Status: DC | PRN

## 2018-04-24 MED ORDER — PROPOFOL 10 MG/ML IV BOLUS
INTRAVENOUS | Status: AC
  Filled 2018-04-24: qty 20

## 2018-04-24 MED ORDER — LIDOCAINE 5 % EX PTCH
1.0000 | MEDICATED_PATCH | CUTANEOUS | Status: DC
  Filled 2018-04-24: qty 1

## 2018-04-24 MED ORDER — SUCCINYLCHOLINE CHLORIDE 20 MG/ML IJ SOLN
INTRAMUSCULAR | Status: AC
  Filled 2018-04-24: qty 1

## 2018-04-24 MED ORDER — SOD CITRATE-CITRIC ACID 500-334 MG/5ML PO SOLN
30.0000 mL | ORAL | Status: AC
  Administered 2018-04-24: 12:00:00 30 mL via ORAL
  Filled 2018-04-24: qty 15

## 2018-04-24 MED ORDER — LIDOCAINE 5 % EX PTCH
MEDICATED_PATCH | CUTANEOUS | Status: DC | PRN
  Administered 2018-04-24: 1 via TRANSDERMAL

## 2018-04-24 MED ORDER — ONDANSETRON HCL 4 MG/2ML IJ SOLN
INTRAMUSCULAR | Status: DC | PRN
  Administered 2018-04-24: 4 mg via INTRAVENOUS

## 2018-04-24 MED ORDER — OXYCODONE HCL 5 MG PO TABS: 5.0000 mg | ORAL_TABLET | ORAL | Status: DC | PRN

## 2018-04-24 MED ORDER — MAGNESIUM HYDROXIDE 400 MG/5ML PO SUSP: 30.0000 mL | ORAL | Status: DC | PRN

## 2018-04-24 MED ORDER — FERROUS SULFATE 325 (65 FE) MG PO TABS
325.0000 mg | ORAL_TABLET | Freq: Two times a day (BID) | ORAL | Status: DC
  Administered 2018-04-24 – 2018-04-26 (×4): 325 mg via ORAL
  Filled 2018-04-24 (×4): qty 1

## 2018-04-24 MED ORDER — OXYTOCIN 40 UNITS IN NORMAL SALINE INFUSION - SIMPLE MED
INTRAVENOUS | Status: AC
  Filled 2018-04-24: qty 1000

## 2018-04-24 MED ORDER — KETOROLAC TROMETHAMINE 30 MG/ML IJ SOLN
30.0000 mg | Freq: Four times a day (QID) | INTRAMUSCULAR | Status: DC
  Administered 2018-04-24: 14:00:00 30 mg via INTRAVENOUS
  Filled 2018-04-24: qty 1

## 2018-04-24 MED ORDER — SIMETHICONE 80 MG PO CHEW
80.0000 mg | CHEWABLE_TABLET | ORAL | Status: DC | PRN
  Administered 2018-04-24 – 2018-04-25 (×3): 80 mg via ORAL
  Filled 2018-04-24 (×3): qty 1

## 2018-04-24 MED ORDER — WITCH HAZEL-GLYCERIN EX PADS: 1.0000 "application " | MEDICATED_PAD | CUTANEOUS | Status: DC | PRN

## 2018-04-24 MED ORDER — CEFAZOLIN SODIUM-DEXTROSE 2-3 GM-%(50ML) IV SOLR
INTRAVENOUS | Status: DC | PRN
  Administered 2018-04-24: 2 g via INTRAVENOUS

## 2018-04-24 MED ORDER — OXYTOCIN 40 UNITS IN NORMAL SALINE INFUSION - SIMPLE MED
2.5000 [IU]/h | INTRAVENOUS | Status: DC
  Administered 2018-04-24: 2.5 [IU]/h via INTRAVENOUS
  Filled 2018-04-24: qty 1000

## 2018-04-24 MED ORDER — OXYTOCIN 40 UNITS IN NORMAL SALINE INFUSION - SIMPLE MED
INTRAVENOUS | Status: DC | PRN
  Administered 2018-04-24: 500 mL via INTRAVENOUS

## 2018-04-24 MED ORDER — GABAPENTIN 300 MG PO CAPS
300.0000 mg | ORAL_CAPSULE | Freq: Two times a day (BID) | ORAL | Status: DC
  Administered 2018-04-24 – 2018-04-26 (×4): 300 mg via ORAL
  Filled 2018-04-24 (×4): qty 1

## 2018-04-24 MED ORDER — KETOROLAC TROMETHAMINE 30 MG/ML IJ SOLN: 30.0000 mg | Freq: Once | INTRAMUSCULAR | Status: DC

## 2018-04-24 MED ORDER — HYDROMORPHONE HCL 1 MG/ML IJ SOLN: 1.0000 mg | INTRAMUSCULAR | Status: DC | PRN

## 2018-04-24 MED ORDER — DIBUCAINE 1 % RE OINT: 1.0000 "application " | TOPICAL_OINTMENT | RECTAL | Status: DC | PRN

## 2018-04-24 MED ORDER — OXYCODONE-ACETAMINOPHEN 5-325 MG PO TABS: 1.0000 | ORAL_TABLET | ORAL | Status: DC | PRN

## 2018-04-24 MED ORDER — OXYCODONE HCL 5 MG PO TABS: 10.0000 mg | ORAL_TABLET | ORAL | Status: DC | PRN

## 2018-04-24 MED ORDER — FENTANYL CITRATE (PF) 100 MCG/2ML IJ SOLN
INTRAMUSCULAR | Status: DC | PRN
  Administered 2018-04-24: 15 ug via INTRATHECAL

## 2018-04-24 MED ORDER — FENTANYL CITRATE (PF) 100 MCG/2ML IJ SOLN
INTRAMUSCULAR | Status: AC
  Filled 2018-04-24: qty 2

## 2018-04-24 MED ORDER — ZOLPIDEM TARTRATE 5 MG PO TABS: 5.0000 mg | ORAL_TABLET | Freq: Every evening | ORAL | Status: DC | PRN

## 2018-04-24 MED ORDER — SOD CITRATE-CITRIC ACID 500-334 MG/5ML PO SOLN
ORAL | Status: AC
  Filled 2018-04-24: qty 15

## 2018-04-24 MED ORDER — LACTATED RINGERS IV SOLN
INTRAVENOUS | Status: DC
  Administered 2018-04-24: 10:00:00 via INTRAVENOUS

## 2018-04-24 MED ORDER — KETOROLAC TROMETHAMINE 30 MG/ML IJ SOLN: 30.0000 mg | Freq: Four times a day (QID) | INTRAMUSCULAR | Status: DC

## 2018-04-24 SURGICAL SUPPLY — 28 items
BAG COUNTER SPONGE EZ (MISCELLANEOUS) ×2 IMPLANT
BENZOIN TINCTURE PRP APPL 2/3 (GAUZE/BANDAGES/DRESSINGS) ×2 IMPLANT
C section Drape CLR Screen ×2 IMPLANT
CANISTER SUCT 3000ML PPV (MISCELLANEOUS) ×3 IMPLANT
CHLORAPREP W/TINT 26 (MISCELLANEOUS) ×6 IMPLANT
COUNTER SPONGE BAG EZ (MISCELLANEOUS) ×1
COVER WAND RF STERILE (DRAPES) ×3 IMPLANT
DRSG TELFA 3X8 NADH (GAUZE/BANDAGES/DRESSINGS) ×3 IMPLANT
ELECT REM PT RETURN 9FT ADLT (ELECTROSURGICAL) ×3
ELECTRODE REM PT RTRN 9FT ADLT (ELECTROSURGICAL) ×1 IMPLANT
GAUZE SPONGE 4X4 12PLY STRL (GAUZE/BANDAGES/DRESSINGS) ×3 IMPLANT
GLOVE BIO SURGEON STRL SZ 6.5 (GLOVE) ×2 IMPLANT
GLOVE BIO SURGEONS STRL SZ 6.5 (GLOVE) ×1
GLOVE INDICATOR 7.0 STRL GRN (GLOVE) ×3 IMPLANT
GOWN STRL REUS W/ TWL LRG LVL3 (GOWN DISPOSABLE) ×2 IMPLANT
GOWN STRL REUS W/TWL LRG LVL3 (GOWN DISPOSABLE) ×4
KIT TURNOVER KIT A (KITS) ×3 IMPLANT
NS IRRIG 1000ML POUR BTL (IV SOLUTION) ×3 IMPLANT
PACK C SECTION AR (MISCELLANEOUS) ×3 IMPLANT
PAD DRESSING TELFA 3X8 NADH (GAUZE/BANDAGES/DRESSINGS) ×1 IMPLANT
PAD OB MATERNITY 4.3X12.25 (PERSONAL CARE ITEMS) ×3 IMPLANT
PAD PREP 24X41 OB/GYN DISP (PERSONAL CARE ITEMS) ×3 IMPLANT
RTRCTR C-SECT PINK 25CM LRG (MISCELLANEOUS) ×2 IMPLANT
SUT MNCRL AB 4-0 PS2 18 (SUTURE) ×3 IMPLANT
SUT PLAIN 2 0 XLH (SUTURE) IMPLANT
SUT VIC AB 0 CT1 36 (SUTURE) ×12 IMPLANT
SUT VIC AB 3-0 SH 27 (SUTURE) ×2
SUT VIC AB 3-0 SH 27X BRD (SUTURE) ×1 IMPLANT

## 2018-04-24 NOTE — Transfer of Care (Signed)
Immediate Anesthesia Transfer of Care Note  Patient: Mary Parsons  Procedure(s) Performed: REPEAT CESAREAN SECTION WITH BILATERAL TUBAL LIGATION (Bilateral )  Patient Location: PACU  Anesthesia Type:Spinal  Level of Consciousness: awake, alert, oriented  Airway & Oxygen Therapy: Patient Spontanous Breathing  Post-op Assessment: Report given to RN and Post -op Vital signs reviewed and stable  Post vital signs: Reviewed and stable  Last Vitals:  Vitals Value Taken Time  BP 132/57 04/24/2018 12:59 PM  Temp    Pulse 69 04/24/2018 12:59 PM  Resp 11 04/24/2018 12:59 PM  SpO2      Last Pain:  Vitals:   04/24/18 1259  TempSrc:   PainSc: 0-No pain         Complications: No apparent anesthesia complications

## 2018-04-24 NOTE — Anesthesia Post-op Follow-up Note (Signed)
Anesthesia QCDR form completed.        
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
## 2018-04-24 NOTE — Op Note (Signed)
Cesarean Section Procedure Note  Indications: previous C-section x 1, patient declines vag del attempt  Pre-operative Diagnosis: 39 week 3 day pregnancy, prior C-section x 1, desires permanent sterilization.  Post-operative Diagnosis: Same  Surgeon: Mary Laser, MD  Assistants: Serafina Royals, CNM. No other capable assistant available in surgery requiring high level assistant.  Procedure: Repeat low transverse Cesarean Section with bilateral tubal ligation  Anesthesia: Spinal anesthesia  Procedure Details: The patient was seen in the Holding Room. The risks, benefits, complications, treatment options, and expected outcomes were discussed with the patient.  The patient concurred with the proposed plan, giving informed consent.  The site of surgery properly noted/marked. The patient was taken to the Operating Room, identified as Mary Parsons and the procedure verified as C-Section Delivery. A Time Out was held and the above information confirmed.  After induction of anesthesia, the patient was draped and prepped in the usual sterile manner. Anesthesia was tested and noted to be adequate. A Pfannenstiel incision was made and carried down through the subcutaneous tissue to the fascia. Fascial incision was made and extended transversely. The fascia was separated from the underlying rectus tissue superiorly and inferiorly. The peritoneum was identified and entered. Peritoneal incision was extended longitudinally. The surgical assist was able to provide retraction to allow for clear visualization of surgical site. The utero-vesical peritoneal reflection was incised transversely and the bladder flap was bluntly freed from the lower uterine segment. A low transverse uterine incision was made. Delivered from cephalic presentation was a 3200 gram Female with Apgar scores of 8 at one minute and 9 at five minutes.  The assistant was able to apply adequate fundal pressure to allow for successful delivery  of the fetus. After the umbilical cord was clamped and cut cord blood was obtained for evaluation. The placenta was removed intact and appeared normal. The uterus was exteriorized and cleared of all clots and debris. The uterine outline, tubes and ovaries appeared normal.  The uterine incision was closed with running locked sutures of 0-Vicryl. Hemostasis was observed.   Attention was then turned to the fallopian tubes, and where the patient's right fallopian tube was identified and grasped with a Babcock clamp.  The tube was then followed out to the fimbria.  The Babcock clamp was then used to grasp the tube approximately 4 cm from the cornual region.  A 3 cm segment of tube was then ligated with a free tie of 0-Chromic using the [place] method and excised.  The left fallopian tube was then ligated in a similar fashion and excised. The tubal lumens were cauterized bilaterally.  Good hemostasis was noted with bilateral fallopian tubes. The uterus was then returned to the abdomen.   Lavage was carried out until clear. The fascia was then reapproximated with a running suture of 1-0 Vicryl. The subcutaneous fat layer was reapproximated with 3-0 Vicryl. skin was reapproximated with 4-0 Monocryl.  Instrument, sponge, and needle counts were correct prior the abdominal closure and at the conclusion of the case.   Findings: Female infant, cephalic presentation, 3200 grams, with Apgar scores of 8 at one minute and 9 at five minutes. Intact placenta with 3 vessel cord.  Clear amniotic fluid at rupture The uterine outline, tubes and ovaries appeared normal.   Estimated Blood Loss:  600 ml      Drains: foley catheter to gravity drainage, 125 ml of clear urine at end of the procedure  Total IV Fluids:  700 ml  Specimens: Bilateral fallopian tube segments,  sent to pathology.         Implants: None         Complications:  None; patient tolerated the procedure well.         Disposition: PACU -  hemodynamically stable.         Condition: stable   Mary Parsons, Mary Napierkowski, MD [facility]

## 2018-04-24 NOTE — Anesthesia Procedure Notes (Signed)
Spinal  Patient location during procedure: OR Start time: 04/24/2018 11:40 AM End time: 04/24/2018 11:42 AM Staffing Anesthesiologist: Emmie Niemann, MD Resident/CRNA: Hedda Slade, CRNA Performed: resident/CRNA  Preanesthetic Checklist Completed: patient identified, site marked, surgical consent, pre-op evaluation, timeout performed, IV checked, risks and benefits discussed and monitors and equipment checked Spinal Block Patient position: sitting Prep: ChloraPrep Patient monitoring: heart rate, continuous pulse ox and blood pressure Approach: midline Location: L4-5 Injection technique: single-shot Needle Needle type: Introducer and Pencil-Tip  Needle gauge: 24 G Needle length: 9 cm Additional Notes Negative paresthesia. Negative blood return. Positive free-flowing CSF. Expiration date of kit checked and confirmed. Patient tolerated procedure well, without complications.

## 2018-04-24 NOTE — Anesthesia Preprocedure Evaluation (Signed)
Anesthesia Evaluation  Patient identified by MRN, date of birth, ID band Patient awake    Reviewed: Allergy & Precautions, NPO status , Patient's Chart, lab work & pertinent test results  History of Anesthesia Complications Negative for: history of anesthetic complications  Airway Mallampati: II  TM Distance: >3 FB Neck ROM: Full    Dental no notable dental hx.    Pulmonary neg pulmonary ROS, neg sleep apnea, neg COPD,    breath sounds clear to auscultation- rhonchi (-) wheezing      Cardiovascular Exercise Tolerance: Good (-) hypertension(-) CAD and (-) Past MI  Rhythm:Regular Rate:Normal - Systolic murmurs and - Diastolic murmurs    Neuro/Psych negative neurological ROS  negative psych ROS   GI/Hepatic negative GI ROS, Neg liver ROS,   Endo/Other  negative endocrine ROSneg diabetes  Renal/GU negative Renal ROS     Musculoskeletal negative musculoskeletal ROS (+)   Abdominal Gravid abdomen   Peds  Hematology negative hematology ROS (+)   Anesthesia Other Findings 1 prior c-section for fetal distress  Reproductive/Obstetrics (+) Pregnancy                             Lab Results  Component Value Date   WBC 6.7 04/23/2018   HGB 12.9 04/23/2018   HCT 36.5 04/23/2018   MCV 87.5 04/23/2018   PLT 173 04/23/2018    Anesthesia Physical Anesthesia Plan  ASA: II  Anesthesia Plan: Spinal   Post-op Pain Management:    Induction:   PONV Risk Score and Plan: 2 and Ondansetron  Airway Management Planned: Natural Airway  Additional Equipment:   Intra-op Plan:   Post-operative Plan:   Informed Consent: I have reviewed the patients History and Physical, chart, labs and discussed the procedure including the risks, benefits and alternatives for the proposed anesthesia with the patient or authorized representative who has indicated his/her understanding and acceptance.     Dental  advisory given  Plan Discussed with: CRNA and Anesthesiologist  Anesthesia Plan Comments:         Anesthesia Quick Evaluation

## 2018-04-25 DIAGNOSIS — Z302 Encounter for sterilization: Secondary | ICD-10-CM

## 2018-04-25 DIAGNOSIS — O34219 Maternal care for unspecified type scar from previous cesarean delivery: Secondary | ICD-10-CM

## 2018-04-25 DIAGNOSIS — Z3A39 39 weeks gestation of pregnancy: Secondary | ICD-10-CM

## 2018-04-25 LAB — CBC
HCT: 31.2 % — ABNORMAL LOW (ref 36.0–46.0)
Hemoglobin: 10.9 g/dL — ABNORMAL LOW (ref 12.0–15.0)
MCH: 31.6 pg (ref 26.0–34.0)
MCHC: 34.9 g/dL (ref 30.0–36.0)
MCV: 90.4 fL (ref 80.0–100.0)
Platelets: 138 10*3/uL — ABNORMAL LOW (ref 150–400)
RBC: 3.45 MIL/uL — ABNORMAL LOW (ref 3.87–5.11)
RDW: 11.9 % (ref 11.5–15.5)
WBC: 6.9 10*3/uL (ref 4.0–10.5)
nRBC: 0 % (ref 0.0–0.2)

## 2018-04-25 MED ORDER — ACETAMINOPHEN 325 MG PO TABS
650.0000 mg | ORAL_TABLET | ORAL | Status: DC | PRN
  Administered 2018-04-26 (×2): 650 mg via ORAL
  Filled 2018-04-25 (×2): qty 2

## 2018-04-25 NOTE — Anesthesia Post-op Follow-up Note (Signed)
  Anesthesia Pain Follow-up Note  Patient: Mary Parsons  Day #: 1  Date of Follow-up: 04/25/2018 Time: 7:17 AM  Last Vitals:  Vitals:   04/25/18 0200 04/25/18 0300  BP:  119/63  Pulse: 68 67  Resp:  18  Temp:  36.6 C  SpO2: 100% 100%    Level of Consciousness: alert  Pain: none   Side Effects:None  Catheter Site Exam:clean, dry, no drainage     Plan: D/C from anesthesia care at surgeon's request  Karoline Caldwell

## 2018-04-25 NOTE — Anesthesia Postprocedure Evaluation (Signed)
Anesthesia Post Note  Patient: Mary Parsons  Procedure(s) Performed: REPEAT CESAREAN SECTION WITH BILATERAL TUBAL LIGATION (Bilateral )  Patient location during evaluation: Mother Baby Anesthesia Type: Spinal Level of consciousness: oriented and awake and alert Pain management: pain level controlled Vital Signs Assessment: post-procedure vital signs reviewed and stable Respiratory status: spontaneous breathing and respiratory function stable Cardiovascular status: blood pressure returned to baseline and stable Postop Assessment: no headache, no backache, no apparent nausea or vomiting and able to ambulate Anesthetic complications: no     Last Vitals:  Vitals:   04/25/18 0200 04/25/18 0300  BP:  119/63  Pulse: 68 67  Resp:  18  Temp:  36.6 C  SpO2: 100% 100%    Last Pain:  Vitals:   04/25/18 0300  TempSrc: Oral  PainSc: 0-No pain                 Izaiyah Kleinman Lawerance Cruel

## 2018-04-25 NOTE — Progress Notes (Signed)
Postpartum Day # 1: Cesarean Delivery with BTL  Subjective: Patient reports tolerating PO and no problems voiding.  Denies pain.  Has not yet passed flatus or had a BM.   Objective: Vital signs in last 24 hours: Temp:  [97.5 F (36.4 C)-98.1 F (36.7 C)] 98 F (36.7 C) (04/08 0740) Pulse Rate:  [50-79] 68 (04/08 0740) Resp:  [11-25] 18 (04/08 0740) BP: (105-132)/(54-80) 107/73 (04/08 0740) SpO2:  [96 %-100 %] 99 % (04/08 0930)  Physical Exam:  General: alert and no distress Lungs: clear to auscultation bilaterally Breasts: normal appearance, no masses or tenderness Heart: regular rate and rhythm, S1, S2 normal, no murmur, click, rub or gallop Abdomen: soft, non-tender; bowel sounds normal; no masses,  no organomegaly Pelvis: Lochia appropriate, Uterine Fundus firm, Incision: healing well, no dehiscence, no significant erythema.  Moderately soaked bandage with old blood, no new active drainage noted after removal of bandage Extremities: DVT Evaluation: No evidence of DVT seen on physical exam. Negative Homan's sign. No cords or calf tenderness. No significant calf/ankle edema.  Recent Labs    04/23/18 1107 04/25/18 0421  HGB 12.9 10.9*  HCT 36.5 31.2*    Assessment/Plan: Status post Cesarean section. Doing well postoperatively.  Plan for discharge tomorrow Regular diet as tolerated Continue PO pain management Bottle feeding Intrapartum BTL for contraception Continue current care.  Hildred Laser, MD [facility]

## 2018-04-26 LAB — SURGICAL PATHOLOGY

## 2018-04-26 MED ORDER — FERROUS SULFATE 325 (65 FE) MG PO TABS: 325.0000 mg | ORAL_TABLET | Freq: Two times a day (BID) | ORAL | 3 refills | Status: DC

## 2018-04-26 MED ORDER — IBUPROFEN 800 MG PO TABS: 800.0000 mg | ORAL_TABLET | Freq: Four times a day (QID) | ORAL | 0 refills | Status: DC

## 2018-04-26 MED ORDER — ACETAMINOPHEN 325 MG PO TABS: 650.0000 mg | ORAL_TABLET | Freq: Four times a day (QID) | ORAL | 0 refills | Status: DC | PRN

## 2018-04-26 MED ORDER — OXYCODONE HCL 5 MG PO TABS: 5.0000 mg | ORAL_TABLET | ORAL | 0 refills | Status: DC | PRN

## 2018-04-26 NOTE — Progress Notes (Signed)
Patient discharged home with infant. Discharge instructions and prescriptions given and reviewed with patient. Patient verbalized understanding. Escorted out by staff.  

## 2018-04-26 NOTE — Plan of Care (Signed)
Vs stable; up ad lib; tolerating regular diet; taking motrin and tylenol for pain control

## 2018-04-26 NOTE — Discharge Summary (Signed)
Obstetric Discharge Summary  Patient ID: Mary Parsons MRN: [id number] DOB/AGE: [birth year] 27 y.o.   Date of Admission: 04/24/2018  Date of Discharge:  04/26/18  Admitting Diagnosis: Scheduled cesarean section at [redacted]w[redacted]d  Secondary Diagnosis: Previous cesarean section, Desires permanent sterilization  Mode of Delivery: repeat cesarean section-low transverse and tubal ligation was performed     Discharge Diagnosis: Reasons for cesarean section  Elective repeat   Intrapartum Procedures: Atificial rupture of membranes and tubal ligation   Post partum procedures: None  Complications: None   Brief Hospital Course    Mary Parsons is a [id number] who underwent cesarean section on 04/24/2018.  Patient had an uncomplicated surgery; for further details, please refer to the operative note.  Patient had an uncomplicated postpartum course.  By time of discharge on POD#2/PPD#2, her pain was controlled on oral pain medications; she had appropriate lochia and was ambulating, voiding without difficulty, tolerating regular diet and passing flatus.   She was deemed stable for discharge to home.    Labs: CBC Latest Ref Rng & Units 04/25/2018 04/23/2018 02/06/2018  WBC 4.0 - 10.5 K/uL 6.9 6.7 5.9  Hemoglobin 12.0 - 15.0 g/dL 10.9(L) 12.9 12.3  Hematocrit 36.0 - 46.0 % 31.2(L) 36.5 33.9(L)  Platelets 150 - 400 K/uL 138(L) 173 187   A POS  Physical exam:   Temp:  [98.1 F (36.7 C)-98.5 F (36.9 C)] 98.5 F (36.9 C) (04/09 0734) Pulse Rate:  [75-77] 77 (04/09 0734) Resp:  [16-20] 18 (04/09 0734) BP: (119-133)/(69-78) 127/76 (04/09 0734) SpO2:  [99 %-100 %] 100 % (04/09 0734)  General: alert and no distress  Lochia: appropriate  Abdomen: soft, NT  Uterine Fundus: firm  Incision: healing well, no significant drainage, no dehiscence, no significant erythema  Extremities: No evidence of DVT seen on physical exam. No lower extremity edema.  Discharge Instructions: Per After Visit  Summary.  Activity: Advance as tolerated. Pelvic rest for 6 weeks.  Also refer to After Visit Summary  Diet: Regular  Medications: Allergies as of 04/26/2018      Reactions   Adhesive [tape] Other (See Comments)   Burns skin off--tolerates PAPER TAPE ONLY      Medication List    STOP taking these medications   cyclobenzaprine 10 MG tablet Commonly known as:  FLEXERIL   hydrOXYzine 25 MG tablet Commonly known as:  ATARAX/VISTARIL     TAKE these medications   acetaminophen 325 MG tablet Commonly known as:  TYLENOL Take 2 tablets (650 mg total) by mouth every 6 (six) hours as needed for mild pain or moderate pain. What changed:    medication strength  how much to take  reasons to take this   ferrous sulfate 325 (65 FE) MG tablet Take 1 tablet (325 mg total) by mouth 2 (two) times daily with a meal.   ibuprofen 800 MG tablet Commonly known as:  ADVIL,MOTRIN Take 1 tablet (800 mg total) by mouth every 6 (six) hours.   oxyCODONE 5 MG immediate release tablet Commonly known as:  Oxy IR/ROXICODONE Take 1 tablet (5 mg total) by mouth every 4 (four) hours as needed for moderate pain.   pantoprazole 20 MG tablet Commonly known as:  Protonix Take 1 tablet (20 mg total) by mouth daily.   prenatal multivitamin Tabs tablet Take 1 tablet by mouth daily at 12 noon.            Discharge Care Instructions  (From admission, onward)         Start  Ordered   04/26/18 0000  Discharge wound care:    Comments:  Follow up with office for incision check in one (1) week   04/26/18 1048         Outpatient follow up:  Follow-up Information    Gunnar BullaLawhorn, Jenkins Michelle, CNM. Go in 1 week(s).   Specialties:  Certified Nurse Midwife, Obstetrics and Gynecology, Radiology Why:  As previously scheduled Contact information: [address] [place] [id number] [phone]          Postpartum contraception: bilateral tubal ligation  Discharged  Condition: stable  Discharged to: home   Newborn Data:  Disposition:home with mother  Apgars: APGAR (1 MIN): 8   APGAR (5 MINS): 9    Baby Feeding: Bottle   Gunnar BullaJenkins Michelle Lawhorn, CNM [facility], [facility] 04/26/18 10:51 AM

## 2018-04-26 NOTE — Discharge Instructions (Signed)

## 2018-04-28 ENCOUNTER — Inpatient Hospital Stay (HOSPITAL_COMMUNITY): Admission: AD | Admit: 2018-04-28 | Payer: Commercial Managed Care - HMO | Source: Home / Self Care

## 2018-05-01 ENCOUNTER — Encounter: Payer: Self-pay | Admitting: Certified Nurse Midwife

## 2018-05-03 ENCOUNTER — Encounter: Payer: 59 | Admitting: Certified Nurse Midwife

## 2018-05-03 ENCOUNTER — Ambulatory Visit (INDEPENDENT_AMBULATORY_CARE_PROVIDER_SITE_OTHER): Payer: 59 | Admitting: Certified Nurse Midwife

## 2018-05-03 ENCOUNTER — Other Ambulatory Visit: Payer: Self-pay

## 2018-05-03 VITALS — BP 149/83 | HR 62 | Ht 62.0 in | Wt 159.1 lb

## 2018-05-03 DIAGNOSIS — Z5189 Encounter for other specified aftercare: Secondary | ICD-10-CM

## 2018-05-03 NOTE — Progress Notes (Signed)
    OBSTETRICS/GYNECOLOGY POST-OPERATIVE CLINIC VISIT  Subjective:     Mary Parsons is a 28 y.o. female who presents to the clinic 1 week status post repeat cesarean section and bilateral tubal ligation for history of previous surgical birth and desire for sterilization.   Eating a regular diet without difficulty. Bowel movements are normal. Pain is controlled with current analgesics. Medications being used: ibuprofen (OTC).   Denies difficulty breathing or respiratory distress, chest pain, abdominal pain, excessive vaginal bleeding, dysuria, and leg pain or swelling.   The following portions of the patient's history were reviewed and updated as appropriate: allergies, current medications, past family history, past medical history, past social history, past surgical history and problem list.  Review of Systems  Pertinent items are noted in HPI.   Objective:    BP (!) 149/83   Pulse 62   Ht 5\' 2"  (1.575 m)   Wt 159 lb 2 oz (72.2 kg)   Breastfeeding No   BMI 29.10 kg/m    General:  alert and no distress  Abdomen: soft, bowel sounds active, non-tender; tape burns present  Incision:   healing well, no drainage, no erythema, no hernia, no seroma, no swelling, no dehiscence, incision well approximated   Assessment:   Doing well postoperatively.   Plan:   Continue any current medications.   Wound care discussed.  Activity restrictions: no lifting more than 10 pounds  Anticipated return to work: not applicable  . Follow up: 5 weeks for postpartum visit or sooner if needed   Gunnar Bulla, CNM [facility], [facility] 05/03/18 3:12 PM

## 2018-05-03 NOTE — Patient Instructions (Addendum)
Home Care Instructions for Mom  Activity  · Gradually return to your regular activities.  · Let yourself rest. Nap while your baby sleeps.  · Avoid lifting anything that is heavier than 10 lb (4.5 kg) until your health care provider says it is okay.  · Avoid activities that take a lot of effort and energy (are strenuous) until approved by your health care provider. Walking at a slow-to-moderate pace is usually safe.  · If you had a cesarean delivery:  ? Do not vacuum, climb stairs, or drive a car for 4-6 weeks.  ? Have someone help you at home until you feel like you can do your usual activities yourself.  ? Do exercises as told by your health care provider, if this applies.  Vaginal bleeding  You may continue to bleed for 4-6 weeks after delivery. Over time, the amount of blood usually decreases and the color of the blood usually gets lighter. However, the flow of bright red blood may increase if you have been too active. If you need to use more than one pad in an hour because your pad gets soaked, or if you pass a large clot:  · Lie down.  · Raise your feet.  · Place a cold compress on your lower abdomen.  · Rest.  · Call your health care provider.  If you are breastfeeding, your period should return anytime between 8 weeks after delivery and the time that you stop breastfeeding. If you are not breastfeeding, your period should return 6-8 weeks after delivery.  Perineal care  The perineal area, or perineum, is the part of your body between your thighs. After delivery, this area needs special care. Follow these instructions as told by your health care provider.  · Take warm tub baths for 15-20 minutes.  · Use medicated pads and pain-relieving sprays and creams as told.  · Do not use tampons or douches until vaginal bleeding has stopped.  · Each time you go to the bathroom:  ? Use a peri bottle.  ? Change your pad.  ? Use towelettes in place of toilet paper until your stitches have healed.  · Do Kegel exercises  every day. Kegel exercises help to maintain the muscles that support the vagina, bladder, and bowels. You can do these exercises while you are standing, sitting, or lying down. To do Kegel exercises:  ? Tighten the muscles of your abdomen and the muscles that surround your birth canal.  ? Hold for a few seconds.  ? Relax.  ? Repeat until you have done this 5 times in a row.  · To prevent hemorrhoids from developing or getting worse:  ? Drink enough fluid to keep your urine clear or pale yellow.  ? Avoid straining when having a bowel movement.  ? Take over-the-counter medicines and stool softeners as told by your health care provider.  Breast care  · Wear a tight-fitting bra.  · Avoid taking over-the-counter pain medicine for breast discomfort.  · Apply ice to the breasts to help with discomfort as needed:  ? Put ice in a plastic bag.  ? Place a towel between your skin and the bag.  ? Leave the ice on for 20 minutes or as told by your health care provider.  Nutrition  · Eat a well-balanced diet.  · Do not try to lose weight quickly by cutting back on calories.  · Take your prenatal vitamins until your postpartum checkup or until your health care provider tells   you have postpartum depression, get support from your partner, friends, and family. If the depression does not go away on its own after several weeks, contact your health care provider. Breast self-exam  Do a breast self-exam each month, at the same time of the month. If you are breastfeeding, check your breasts just after a feeding, when your breasts are less full. If you are breastfeeding and your period has started, check your breasts on day 5, 6, or 7 of your period.  Report any lumps, bumps, or discharge to your health care provider. Know that breasts are normally lumpy if you are breastfeeding. This is temporary, and it is not a health risk. Intimacy and sexuality Avoid sexual activity for at least 3-4 weeks after delivery or until the brownish-red vaginal flow is completely gone. If you want to avoid pregnancy, use some form of birth control. You can get pregnant after delivery, even if you have not had your period. Contact a health care provider if:  You feel unable to cope with the changes that a child brings to your life, and these feelings do not go away after several weeks.  You notice a lump, a bump, or discharge on your breast. Get help right away if:  Blood soaks your pad in 1 hour or less.  You have: ? Severe pain or cramping in your lower abdomen. ? A bad-smelling vaginal discharge. ? A fever that is not controlled by medicine. ? A fever, and an area of your breast is red and sore. ? Pain or redness in your calf. ? Sudden, severe chest pain. ? Shortness of breath. ? Painful or bloody urination. ? Problems with your vision. ? You vomit for 12 hours or longer. ? You develop a severe headache. ? You have serious thoughts about hurting yourself, your child, or anyone else. This information is not intended to replace advice given to you by your health care provider. Make sure you discuss any questions you have with your health care provider. Document Released: 01/01/2000 Document Revised: 03/01/2017 Document Reviewed: 07/07/2014 Elsevier Interactive Patient Education  2019 Elsevier Inc.  Perinatal Anxiety When a woman feels excessive tension or worry (anxiety) during pregnancy or during the first 12 months after she gives birth, she has a condition called perinatal anxiety. Anxiety can interfere with work, school, relationships, and other everyday activities. If it is not managed properly, it can also cause problems in the mother and her  baby.  If you are pregnant and you have symptoms of an anxiety disorder, it is important to talk with your health care provider. What are the causes? The exact cause of this condition is not known. Hormonal changes during and after pregnancy may play a role in causing perinatal anxiety. What increases the risk? You are more likely to develop this condition if:  You have a personal or family history of depression, anxiety, or mood disorders.  You experience a stressful life event during pregnancy, such as the death of a loved one.  You have a lot of regular life stress, such as being a single parent.  You have thyroid problems. What are the signs or symptoms? Perinatal anxiety can be different for everyone. It may include:  Panic attacks (panic disorder). These are intense episodes of fear or discomfort that may also cause sweating, nausea, shortness of breath, or fear of dying. They usually last 5-15 minutes.  Reliving an upsetting (traumatic) event through distressing thoughts, dreams, or flashbacks (post-traumatic stress disorder, or PTSD).  Excessive worry about multiple problems (generalized anxiety disorder).  Fear and stress about leaving certain people or loved ones (separation anxiety).  Performing repetitive tasks (compulsions) to relieve stress or worry (obsessive compulsive disorder, or OCD).  Fear of certain objects or situations (phobias).  Excessive worrying, such as a constant feeling that something bad is going to happen.  Inability to relax.  Difficulty concentrating.  Sleep problems.  Frequent nightmares or disturbing thoughts. How is this diagnosed? This condition is diagnosed based on a physical exam and mental evaluation. In some cases, your health care provider may use an anxiety screening tool. These tools include a list of questions that can help a health care provider diagnose anxiety. Your health care provider may refer you to a mental health expert  who specializes in anxiety. How is this treated? This condition may be treated with:  Medicines. Your health care provider will only give you medicines that have been proven safe for pregnancy and breastfeeding.  Talk therapy with a mental health professional to help change your patterns of thinking (cognitive behavioral therapy).  Mindfulness-based stress reduction.  Other relaxation therapies, such as deep breathing or guided muscle relaxation.  Support groups. Follow these instructions at home: Lifestyle  Do not use any products that contain nicotine or tobacco, such as cigarettes and e-cigarettes. If you need help quitting, ask your health care provider.  Do not use alcohol when you are pregnant. After your baby is born, limit alcohol intake to no more than 1 drink a day. One drink equals 12 oz of beer, 5 oz of wine, or 1 oz of hard liquor.  Consider joining a support group for new mothers. Ask your health care provider for recommendations.  Take good care of yourself. Make sure you: ? Get plenty of sleep. If you are having trouble sleeping, talk with your health care provider. ? Eat a healthy diet. This includes plenty of fruits and vegetables, whole grains, and lean proteins. ? Exercise regularly, as told by your health care provider. Ask your health care provider what exercises are safe for you. General instructions  Take over-the-counter and prescription medicines only as told by your health care provider.  Talk with your partner or family members about your feelings during pregnancy. Share any concerns or fears that you may have.  Ask for help with tasks or chores when you need it. Ask friends and family members to provide meals, watch your children, or help with cleaning.  Keep all follow-up visits as told by your health care provider. This is important. Contact a health care provider if:  You (or people close to you) notice that you have any symptoms of anxiety or  depression.  You have anxiety and your symptoms get worse.  You experience side effects from medicines, such as nausea or sleep problems. Get help right away if:  You feel like hurting yourself, your baby, or someone else. If you ever feel like you may hurt yourself or others, or have thoughts about taking your own life, get help right away. You can go to your nearest emergency department or call:  Your local emergency services (911 in the [place]  A suicide crisis helpline, such as the National Suicide Prevention Lifeline at (807)833-5896. This is open 24 hours a day. Summary  Perinatal anxiety is when a woman feels excessive tension or worry during pregnancy or during the first 12 months after she gives birth.  Perinatal anxiety may include panic attacks, post-traumatic stress disorder, separation anxiety,  phobias, or generalized anxiety.  Perinatal anxiety can cause physical health problems in the mother and baby if not properly managed.  This condition is treated with medicines, talk therapy, stress reduction therapies, or a combination of two or more treatments.  Talk with your partner or family members about your concerns or fears. Do not be afraid to ask for help. This information is not intended to replace advice given to you by your health care provider. Make sure you discuss any questions you have with your health care provider. Document Released: 03/02/2016 Document Revised: 03/02/2016 Document Reviewed: 03/02/2016 Elsevier Interactive Patient Education  [employer]2019 Else[employer] Inc.

## 2018-06-05 ENCOUNTER — Encounter: Payer: Self-pay | Admitting: Certified Nurse Midwife

## 2018-06-06 ENCOUNTER — Telehealth: Payer: Self-pay

## 2018-06-06 NOTE — Telephone Encounter (Signed)
Coronavirus (COVID-19) Are you at risk?  Are you at risk for the Coronavirus (COVID-19)?  To be considered HIGH RISK for Coronavirus (COVID-19), you have to meet the following criteria:  . Traveled to [place], [place], [place], [place] or [place]; or in the [place] to [place], [place], [place], or [place]; and have fever, cough, and shortness of breath within the last 2 weeks of travel OR . Been in close contact with a person diagnosed with COVID-19 within the last 2 weeks and have fever, cough, and shortness of breath . IF YOU DO NOT MEET THESE CRITERIA, YOU ARE CONSIDERED LOW RISK FOR COVID-19.  What to do if you are HIGH RISK for COVID-19?  . If you are having a medical emergency, call 911. . Seek medical care right away. Before you go to a doctor's office, urgent care or emergency department, call ahead and tell them about your recent travel, contact with someone diagnosed with COVID-19, and your symptoms. You should receive instructions from your physician's office regarding next steps of care.  . When you arrive at healthcare provider, tell the healthcare staff immediately you have returned from visiting [place], [place], [place], [place] or [place]; or traveled in the [place] to [place], [place], [place], or [place]; in the last two weeks or you have been in close contact with a person diagnosed with COVID-19 in the last 2 weeks.   . Tell the health care staff about your symptoms: fever, cough and shortness of breath. . After you have been seen by a medical provider, you will be either: o Tested for (COVID-19) and discharged home on quarantine except to seek medical care if symptoms worsen, and asked to  - Stay home and avoid contact with others until you get your results (4-5 days)  - Avoid travel on public transportation if possible (such as bus, train, or airplane) or o Sent to the Emergency Department by EMS for evaluation, COVID-19 testing, and possible  admission depending on your condition and test results.  What to do if you are LOW RISK for COVID-19?  Reduce your risk of any infection by using the same precautions used for avoiding the common cold or flu:  . Wash your hands often with soap and warm water for at least 20 seconds.  If soap and water are not readily available, use an alcohol-based hand sanitizer with at least 60% alcohol.  . If coughing or sneezing, cover your mouth and nose by coughing or sneezing into the elbow areas of your shirt or coat, into a tissue or into your sleeve (not your hands). . Avoid shaking hands with others and consider head nods or verbal greetings only. . Avoid touching your eyes, nose, or mouth with unwashed hands.  . Avoid close contact with people who are sick. . Avoid places or events with large numbers of people in one location, like concerts or sporting events. . Carefully consider travel plans you have or are making. . If you are planning any travel outside or inside the [place], visit the CDC's Travelers' Health webpage for the latest health notices. . If you have some symptoms but not all symptoms, continue to monitor at home and seek medical attention if your symptoms worsen. . If you are having a medical emergency, call 911.   ADDITIONAL HEALTHCARE OPTIONS FOR PATIENTS  [facility] / e-Visit: [email or web address]         [facility]: [phone]  [facility]: [phone]                   [facility]: [phone]   Pre-screen negative, DM.   

## 2018-06-07 ENCOUNTER — Encounter: Payer: Self-pay | Admitting: Certified Nurse Midwife

## 2018-06-07 ENCOUNTER — Ambulatory Visit (INDEPENDENT_AMBULATORY_CARE_PROVIDER_SITE_OTHER): Payer: 59 | Admitting: Certified Nurse Midwife

## 2018-06-07 ENCOUNTER — Other Ambulatory Visit (HOSPITAL_COMMUNITY)
Admission: RE | Admit: 2018-06-07 | Discharge: 2018-06-07 | Disposition: A | Discharge status: Home / Self Care | Payer: 59 | Source: Ambulatory Visit | Attending: Certified Nurse Midwife | Admitting: Certified Nurse Midwife

## 2018-06-07 ENCOUNTER — Other Ambulatory Visit: Payer: Self-pay

## 2018-06-07 DIAGNOSIS — Z124 Encounter for screening for malignant neoplasm of cervix: Secondary | ICD-10-CM | POA: Insufficient documentation

## 2018-06-07 NOTE — Patient Instructions (Signed)
General Headache Without Cause A headache is pain or discomfort that is felt around the head or neck area. There are many causes and types of headaches. In some cases, the cause may not be found. Follow these instructions at home: Watch your condition for any changes. Let your doctor know about them. Take these steps to help with your condition: Managing pain      Take over-the-counter and prescription medicines only as told by your doctor.  Lie down in a dark, quiet room when you have a headache.  If told, put ice on your head and neck area: ? Put ice in a plastic bag. ? Place a towel between your skin and the bag. ? Leave the ice on for 20 minutes, 2-3 times per day.  If told, put heat on the affected area. Use the heat source that your doctor recommends, such as a moist heat pack or a heating pad. ? Place a towel between your skin and the heat source. ? Leave the heat on for 20-30 minutes. ? Remove the heat if your skin turns bright red. This is very important if you are unable to feel pain, heat, or cold. You may have a greater risk of getting burned.  Keep lights dim if bright lights bother you or make your headaches worse. Eating and drinking  Eat meals on a regular schedule.  If you drink alcohol: ? Limit how much you use to:  0-1 drink a day for women.  0-2 drinks a day for men. ? Be aware of how much alcohol is in your drink. In the [place] one drink equals one 12 oz bottle of beer (355 mL), one 5 oz glass of wine (148 mL), or one 1 oz glass of hard liquor (44 mL).  Stop drinking caffeine, or reduce how much caffeine you drink. General instructions   Keep a journal to find out if certain things bring on headaches. For example, write down: ? What you eat and drink. ? How much sleep you get. ? Any change to your diet or medicines.  Get a massage or try other ways to relax.  Limit stress.  Sit up straight. Do not tighten (tense) your muscles.  Do not use any  products that contain nicotine or tobacco. This includes cigarettes, e-cigarettes, and chewing tobacco. If you need help quitting, ask your doctor.  Exercise regularly as told by your doctor.  Get enough sleep. This often means 7-9 hours of sleep each night.  Keep all follow-up visits as told by your doctor. This is important. Contact a doctor if:  Your symptoms are not helped by medicine.  You have a headache that feels different than the other headaches.  You feel sick to your stomach (nauseous) or you throw up (vomit).  You have a fever. Get help right away if:  Your headache gets very bad quickly.  Your headache gets worse after a lot of physical activity.  You keep throwing up.  You have a stiff neck.  You have trouble seeing.  You have trouble speaking.  You have pain in the eye or ear.  Your muscles are weak or you lose muscle control.  You lose your balance or have trouble walking.  You feel like you will pass out (faint) or you pass out.  You are mixed up (confused).  You have a seizure. Summary  A headache is pain or discomfort that is felt around the head or neck area.  There are many causes and   types of headaches. In some cases, the cause may not be found.  Keep a journal to help find out what causes your headaches. Watch your condition for any changes. Let your doctor know about them.  Contact a doctor if you have a headache that is different from usual, or if your headache is not helped by medicine.  Get help right away if your headache gets very bad, you throw up, you have trouble seeing, you lose your balance, or you have a seizure. This information is not intended to replace advice given to you by your health care provider. Make sure you discuss any questions you have with your health care provider. Document Released: 10/13/2007 Document Revised: 07/24/2017 Document Reviewed: 07/24/2017 Elsevier Interactive Patient Education  2019 [employer] 18-39 Years, Female Preventive care refers to lifestyle choices and visits with your health care provider that can promote health and wellness. What does preventive care include?   A yearly physical exam. This is also called an annual well check.  Dental exams once or twice a year.  Routine eye exams. Ask your health care provider how often you should have your eyes checked.  Personal lifestyle choices, including: ? Daily care of your teeth and gums. ? Regular physical activity. ? Eating a healthy diet. ? Avoiding tobacco and drug use. ? Limiting alcohol use. ? Practicing safe sex. ? Taking vitamin and mineral supplements as recommended by your health care provider. What happens during an annual well check? The services and screenings done by your health care provider during your annual well check will depend on your age, overall health, lifestyle risk factors, and family history of disease. Counseling Your health care provider may ask you questions about your:  Alcohol use.  Tobacco use.  Drug use.  Emotional well-being.  Home and relationship well-being.  Sexual activity.  Eating habits.  Work and work Statistician.  Method of birth control.  Menstrual cycle.  Pregnancy history. Screening You may have the following tests or measurements:  Height, weight, and BMI.  Diabetes screening. This is done by checking your blood sugar (glucose) after you have not eaten for a while (fasting).  Blood pressure.  Lipid and cholesterol levels. These may be checked every 5 years starting at age 73.  Skin check.  Hepatitis C blood test.  Hepatitis B blood test.  Sexually transmitted disease (STD) testing.  BRCA-related cancer screening. This may be done if you have a family history of breast, ovarian, tubal, or peritoneal cancers.  Pelvic exam and Pap test. This may be done every 3 years starting at age 35. Starting at age 3, this may be done every  5 years if you have a Pap test in combination with an HPV test. Discuss your test results, treatment options, and if necessary, the need for more tests with your health care provider. Vaccines Your health care provider may recommend certain vaccines, such as:  Influenza vaccine. This is recommended every year.  Tetanus, diphtheria, and acellular pertussis (Tdap, Td) vaccine. You may need a Td booster every 10 years.  Varicella vaccine. You may need this if you have not been vaccinated.  HPV vaccine. If you are 72 or younger, you may need three doses over 6 months.  Measles, mumps, and rubella (MMR) vaccine. You may need at least one dose of MMR. You may also need a second dose.  Pneumococcal 13-valent conjugate (PCV13) vaccine. You may need this if you have certain conditions and were not previously  vaccinated.  Pneumococcal polysaccharide (PPSV23) vaccine. You may need one or two doses if you smoke cigarettes or if you have certain conditions.  Meningococcal vaccine. One dose is recommended if you are age 20-21 years and a first-year college student living in a residence hall, or if you have one of several medical conditions. You may also need additional booster doses.  Hepatitis A vaccine. You may need this if you have certain conditions or if you travel or work in places where you may be exposed to hepatitis A.  Hepatitis B vaccine. You may need this if you have certain conditions or if you travel or work in places where you may be exposed to hepatitis B.  Haemophilus influenzae type b (Hib) vaccine. You may need this if you have certain risk factors. Talk to your health care provider about which screenings and vaccines you need and how often you need them. This information is not intended to replace advice given to you by your health care provider. Make sure you discuss any questions you have with your health care provider. Document Released: 03/01/2001 Document Revised: 08/16/2016  Document Reviewed: 11/04/2014 Elsevier Interactive Patient Education  2019 [employer].

## 2018-06-07 NOTE — Progress Notes (Signed)
Patient here for postpartum check, c/o having severe daily headaches for the past week, takes Goodys headache powder with minimal relief.

## 2018-06-07 NOTE — Progress Notes (Signed)
Subjective:    Mary Parsons is a 28 y.o. G73P2012 Caucasian female who presents for a postpartum visit. She is 6 weeks postpartum following a repeat cesarean section, low transverse incision at 39+3 gestational weeks. Anesthesia: spinal. I have fully reviewed the prenatal and intrapartum course.   Postpartum course has been uncomplicated. Baby's course has been uncomplicated. Baby is feeding by formula. Normal menses with associated pelvic pain. Bowel function is normal. Bladder function is normal.   Patient is not sexually active. Contraception method is tubal ligation. Postpartum depression screening: negative. Score 2.  Last pap due.  Denies difficulty breathing or respiratory distress, chest pain, abdominal pain, excessive vaginal bleeding, dysuria, and leg pain or swelling.   The following portions of the patient's history were reviewed and updated as appropriate: allergies, current medications, past medical history, past surgical history and problem list.  Review of Systems  Pertinent items are noted in HPI.   Objective:   BP 121/81   Pulse 66   Ht 5\' 2"  (1.575 m)   Wt 155 lb 4.8 oz (70.4 kg)   LMP 06/02/2018 (Exact Date)   Breastfeeding No   BMI 28.40 kg/m   General:  alert, cooperative and no distress   Breasts:  deferred, no complaints  Lungs: clear to auscultation bilaterally  Heart:  regular rate and rhythm  Abdomen: soft, nontender   Vulva: normal  Vagina: normal vagina  Cervix:  Closed, Pap collected  Corpus: Well-involuted  Adnexa:  Non-palpable    Depression screen [facility] 2/9 06/07/2018  Decreased Interest 0  Down, Depressed, Hopeless 0  PHQ - 2 Score 0  Altered sleeping 1  Tired, decreased energy 1  Change in appetite 0  Feeling bad or failure about yourself  0  Trouble concentrating 0  Moving slowly or fidgety/restless 0  Suicidal thoughts 0  PHQ-9 Score 2  Difficult doing work/chores Not difficult at all       Assessment:   Postpartum exam Six  (6) wks s/p repeat cesarean section with bilateral tubal ligation Formula feeding Depression screening Cervical cancer screening  Plan:   Pap collected, see orders.   May return to work without restrictions.   Samples of Orilissa given with education material.   Reviewed red flag symptoms and when to call.   Follow up for [facility] or earlier if needed.   Gunnar Bulla, CNM [facility], [facility] 06/07/18 11:33 AM

## 2018-06-13 LAB — CYTOLOGY - PAP: Diagnosis: NEGATIVE

## 2018-06-14 ENCOUNTER — Encounter: Payer: Self-pay | Admitting: Certified Nurse Midwife

## 2018-06-20 ENCOUNTER — Encounter: Payer: Self-pay | Admitting: Certified Nurse Midwife

## 2018-06-21 ENCOUNTER — Encounter: Payer: Self-pay | Admitting: Certified Nurse Midwife

## 2018-06-22 ENCOUNTER — Other Ambulatory Visit: Payer: Self-pay

## 2018-06-22 MED ORDER — PAROXETINE HCL 10 MG PO TABS: 10.0000 mg | ORAL_TABLET | Freq: Every day | ORAL | 1 refills | Status: DC

## 2018-06-22 MED ORDER — ELAGOLIX SODIUM 200 MG PO TABS: 200.0000 mg | ORAL_TABLET | Freq: Two times a day (BID) | ORAL | 6 refills | Status: DC

## 2018-07-02 ENCOUNTER — Encounter: Payer: Self-pay | Admitting: Certified Nurse Midwife

## 2018-07-04 ENCOUNTER — Telehealth: Payer: Self-pay | Admitting: Certified Nurse Midwife

## 2018-07-04 NOTE — Telephone Encounter (Signed)
Will from [employer] called to speak with Mary Parsons's Nurse in regards to a prior authorization for the medication Elagolix Sodium (ORILISSA) 200 MG TABS. Will included the reference ID #: [id number] and the call back number is [phone]. Please advise.

## 2018-07-05 ENCOUNTER — Telehealth: Payer: Self-pay | Admitting: Certified Nurse Midwife

## 2018-07-05 NOTE — Telephone Encounter (Signed)
Error

## 2018-07-05 NOTE — Telephone Encounter (Signed)
Spoke with Manjusha P. With Cover my meds.  Need to call prior authorization department at [facility] at [phone].

## 2018-07-10 NOTE — Telephone Encounter (Signed)
6/22 Called [facility] and spoke with Eri, a medical necessity letter is needed and an appeal needs to be requested since request was denied or resend new PA.  Spoke with JML and request faxed sent to [employer] instead.  Waiting for response before trying to appeal with [facility].  Chart will be filed.

## 2018-07-19 ENCOUNTER — Telehealth: Payer: Self-pay

## 2018-07-19 NOTE — Telephone Encounter (Signed)
Faxed received requesting a call from [employer] [employer].  Called [employer] and office is closed in observance of holiday.  Will call back next week when office reopens.

## 2018-07-31 ENCOUNTER — Telehealth: Payer: Self-pay

## 2018-07-31 NOTE — Telephone Encounter (Signed)
Called [employer] after receiving fax stating they have attempted to contact us multiple times. Spoke with Dena B. and she requested we faxed denial letter we received from [facility] to [phone].  Dena also stated we need to send letter of medical necessity to [facility].

## 2018-08-03 ENCOUNTER — Telehealth: Payer: Self-pay | Admitting: Certified Nurse Midwife

## 2018-08-03 NOTE — Telephone Encounter (Signed)
Called and spoke with Raven from [employer].  Raven stated the reason for their call was to let us know a letter of medical necessity was needed and that we did not need to file an appeal but instead resubmit new prior-authorization.  I told Raven we are working on letter and will fax when it is done.

## 2018-08-03 NOTE — Telephone Encounter (Signed)
JC from [employer] and stated that he needs to speak to the nurse of Sharyn Lull in regards to a prior authorization. JC's call back number is [phone] and the Reference number is: [id number]. Please advise.

## 2018-08-07 ENCOUNTER — Ambulatory Visit: Payer: BC Managed Care – PPO | Admitting: Certified Nurse Midwife

## 2018-08-07 ENCOUNTER — Other Ambulatory Visit: Payer: Self-pay

## 2018-08-07 ENCOUNTER — Encounter: Payer: Self-pay | Admitting: Certified Nurse Midwife

## 2018-08-07 VITALS — BP 121/75 | HR 85 | Ht 62.0 in | Wt 160.6 lb

## 2018-08-07 DIAGNOSIS — R102 Pelvic and perineal pain: Secondary | ICD-10-CM

## 2018-08-07 DIAGNOSIS — F419 Anxiety disorder, unspecified: Secondary | ICD-10-CM

## 2018-08-07 DIAGNOSIS — N941 Unspecified dyspareunia: Secondary | ICD-10-CM

## 2018-08-07 NOTE — Progress Notes (Signed)
GYN ENCOUNTER NOTE  Subjective:       Mary Parsons is a 28 y.o. [id number] female is here for gynecologic evaluation of the following issues:  1. Anxiety 2. Pelvic pain 3. Request medication to help with headaches/migraines  Taking Paxil, doesn't feel like "it is the right medication for her". Completed Orlissa samples and felt like it helped.   Denies difficulty breathing or respiratory distress, chest pain, abdominal pain, excessive vaginal bleeding, dysuria, and leg pain or swelling.    Gynecologic History  Patient's last menstrual period was 07/18/2018 (approximate). Period Cycle (Days): 28 Period Duration (Days): 4 Period Pattern: Regular Menstrual Flow: Heavy Menstrual Control: Tampon Dysmenorrhea: (!) Severe Dysmenorrhea Symptoms: Cramping  Contraception: tubal ligation  Last Pap: 05/2018. Results were: normal  Obstetric History  OB History  Gravida Para Term Preterm AB Living  3 2 2   1 2   SAB TAB Ectopic Multiple Live Births  1     0 2    # Outcome Date GA Lbr Len/2nd Weight Sex Delivery Anes PTL Lv  3 Term 04/24/18 3726w3d  7 lb 0.9 oz (3.2 kg) M CS-LTranv Spinal  LIV  2 SAB 11/18/16 15106w5d         1 Term 08/21/15 3626w5d  4 lb 2.2 oz (1.878 kg) M CS-Unspec   LIV     Complications: Fetal Intolerance    Past Medical History:  Diagnosis Date  . GERD (gastroesophageal reflux disease)   . Heavy periods     Past Surgical History:  Procedure Laterality Date  . CESAREAN SECTION    . CESAREAN SECTION WITH BILATERAL TUBAL LIGATION Bilateral 04/24/2018   Procedure: REPEAT CESAREAN SECTION WITH BILATERAL TUBAL LIGATION;  Surgeon: Hildred Laserherry, Anika, MD;  Location: [facility] ORS;  Service: Obstetrics;  Laterality: Bilateral;  TOB: 12:11 Sex: Female Weight: 7 lb 1 oz  . DILATION AND EVACUATION N/A 11/18/2016   Procedure: DILATATION AND EVACUATION;  Surgeon: Hildred Laserherry, Anika, MD;  Location: [facility] ORS;  Service: Gynecology;  Laterality: N/A;  . LAPAROSCOPY N/A 11/18/2016   Procedure:  LAPAROSCOPY DIAGNOSTIC;  Surgeon: Hildred Laserherry, Anika, MD;  Location: [facility] ORS;  Service: Gynecology;  Laterality: N/A;  . TONSILLECTOMY      Current Outpatient Medications on File Prior to Visit  Medication Sig Dispense Refill  . PARoxetine (PAXIL) 10 MG tablet Take 1 tablet (10 mg total) by mouth daily. 120 tablet 1  . Elagolix Sodium (ORILISSA) 200 MG TABS Take 200 mg by mouth 2 (two) times daily. (Patient not taking: Reported on 08/07/2018) 60 tablet 6   No current facility-administered medications on file prior to visit.     Allergies  Allergen Reactions  . Adhesive [Tape] Other (See Comments)    Burns skin off--tolerates PAPER TAPE ONLY    Social History   Socioeconomic History  . Marital status: Married    Spouse name: Not on file  . Number of children: Not on file  . Years of education: Not on file  . Highest education level: Not on file  Occupational History  . Not on file  Social Needs  . Financial resource strain: Not on file  . Food insecurity    Worry: Not on file    Inability: Not on file  . Transportation needs    Medical: Not on file    Non-medical: Not on file  Tobacco Use  . Smoking status: Never Smoker  . Smokeless tobacco: Never Used  Substance and Sexual Activity  . Alcohol use: Yes  Comment: occasional   . Drug use: No  . Sexual activity: Yes    Birth control/protection: Surgical  Lifestyle  . Physical activity    Days per week: Not on file    Minutes per session: Not on file  . Stress: Not on file  Relationships  . Social Herbalist on phone: Not on file    Gets together: Not on file    Attends religious service: Not on file    Active member of club or organization: Not on file    Attends meetings of clubs or organizations: Not on file    Relationship status: Not on file  . Intimate partner violence    Fear of current or ex partner: Not on file    Emotionally abused: Not on file    Physically abused: Not on file    Forced  sexual activity: Not on file  Other Topics Concern  . Not on file  Social History Narrative  . Not on file    Family History  Problem Relation Age of Onset  . Endometriosis Mother   . Endometriosis Maternal Aunt   . Endometriosis Maternal Grandmother   . Breast cancer Neg Hx   . Ovarian cancer Neg Hx   . Colon cancer Neg Hx     The following portions of the patient's history were reviewed and updated as appropriate: allergies, current medications, past family history, past medical history, past social history, past surgical history and problem list.  Review of Systems  ROS negative except as noted above. Information obtained from patient.   Objective:   BP 121/75   Pulse 85   Ht 5\' 2"  (1.575 m)   Wt 160 lb 9.6 oz (72.8 kg)   LMP 07/18/2018 (Approximate)   BMI 29.37 kg/m    CONSTITUTIONAL: Well-developed, well-nourished female in no acute distress.   PHYSICAL EXAM: Not indicated.   Depression screen [facility] 2/9 08/07/2018 06/07/2018  Decreased Interest 0 0  Down, Depressed, Hopeless 0 0  PHQ - 2 Score 0 0  Altered sleeping 1 1  Tired, decreased energy 1 1  Change in appetite 0 0  Feeling bad or failure about yourself  0 0  Trouble concentrating 1 0  Moving slowly or fidgety/restless 0 0  Suicidal thoughts 0 0  PHQ-9 Score 3 2  Difficult doing work/chores Somewhat difficult Not difficult at all   GAD 7 : Generalized Anxiety Score 08/07/2018  Nervous, Anxious, on Edge 1  Control/stop worrying 1  Worry too much - different things 0  Trouble relaxing 3  Restless 2  Easily annoyed or irritable 3  Afraid - awful might happen 0  Total GAD 7 Score 10  Anxiety Difficulty Somewhat difficult    Assessment:   1. Pelvic pain   2. Dyspareunia in female   3. Anxiety   Plan:   Rx Lexapro, Toradol and Zofran, see orders.   Will attempt to refill [place] using new insurance.   Reviewed red flag symptoms and when to call.   RTC x 6-8 weeks for follow up or  sooner if needed.    Diona Fanti, CNM [facility], [facility] 08/10/18 1:31 PM

## 2018-08-07 NOTE — Patient Instructions (Addendum)
Pelvic Pain, Female Pelvic pain is pain in your lower belly (abdomen), below your belly button and between your hips. The pain may start suddenly (be acute), keep coming back (be recurring), or last a long time (become chronic). Pelvic pain that lasts longer than 6 months is called chronic pelvic pain. There are many causes of pelvic pain. Sometimes the cause of pelvic pain is not known. Follow these instructions at home:   Take over-the-counter and prescription medicines only as told by your doctor.  Rest as told by your doctor.  Do not have sex if it hurts.  Keep a journal of your pelvic pain. Write down: ? When the pain started. ? Where the pain is located. ? What seems to make the pain better or worse, such as food or your period (menstrual cycle). ? Any symptoms you have along with the pain.  Keep all follow-up visits as told by your doctor. This is important. Contact a doctor if:  Medicine does not help your pain.  Your pain comes back.  You have new symptoms.  You have unusual discharge or bleeding from your vagina.  You have a fever or chills.  You are having trouble pooping (constipation).  You have blood in your pee (urine) or poop (stool).  Your pee smells bad.  You feel weak or light-headed. Get help right away if:  You have sudden pain that is very bad.  Your pain keeps getting worse.  You have very bad pain and also have any of these symptoms: ? A fever. ? Feeling sick to your stomach (nausea). ? Throwing up (vomiting). ? Being very sweaty.  You pass out (lose consciousness). Summary  Pelvic pain is pain in your lower belly (abdomen), below your belly button and between your hips.  There are many possible causes of pelvic pain.  Keep a journal of your pelvic pain. This information is not intended to replace advice given to you by your health care provider. Make sure you discuss any questions you have with your health care provider. Document  Released: 06/22/2007 Document Revised: 06/21/2017 Document Reviewed: 06/21/2017 Elsevier Patient Education  2020 Elsevier Inc. Generalized Anxiety Disorder, Adult Generalized anxiety disorder (GAD) is a mental health disorder. People with this condition constantly worry about everyday events. Unlike normal anxiety, worry related to GAD is not triggered by a specific event. These worries also do not fade or get better with time. GAD interferes with life functions, including relationships, work, and school. GAD can vary from mild to severe. People with severe GAD can have intense waves of anxiety with physical symptoms (panic attacks). What are the causes? The exact cause of GAD is not known. What increases the risk? This condition is more likely to develop in:  Women.  People who have a family history of anxiety disorders.  People who are very shy.  People who experience very stressful life events, such as the death of a loved one.  People who have a very stressful family environment. What are the signs or symptoms? People with GAD often worry excessively about many things in their lives, such as their health and family. They may also be overly concerned about:  Doing well at work.  Being on time.  Natural disasters.  Friendships. Physical symptoms of GAD include:  Fatigue.  Muscle tension or having muscle twitches.  Trembling or feeling shaky.  Being easily startled.  Feeling like your heart is pounding or racing.  Feeling out of breath or like you cannot  take a deep breath.  Having trouble falling asleep or staying asleep.  Sweating.  Nausea, diarrhea, or irritable bowel syndrome (IBS).  Headaches.  Trouble concentrating or remembering facts.  Restlessness.  Irritability. How is this diagnosed? Your health care provider can diagnose GAD based on your symptoms and medical history. You will also have a physical exam. The health care provider will ask specific  questions about your symptoms, including how severe they are, when they started, and if they come and go. Your health care provider may ask you about your use of alcohol or drugs, including prescription medicines. Your health care provider may refer you to a mental health specialist for further evaluation. Your health care provider will do a thorough examination and may perform additional tests to rule out other possible causes of your symptoms. To be diagnosed with GAD, a person must have anxiety that:  Is out of his or her control.  Affects several different aspects of his or her life, such as work and relationships.  Causes distress that makes him or her unable to take part in normal activities.  Includes at least three physical symptoms of GAD, such as restlessness, fatigue, trouble concentrating, irritability, muscle tension, or sleep problems. Before your health care provider can confirm a diagnosis of GAD, these symptoms must be present more days than they are not, and they must last for six months or longer. How is this treated? The following therapies are usually used to treat GAD:  Medicine. Antidepressant medicine is usually prescribed for long-term daily control. Antianxiety medicines may be added in severe cases, especially when panic attacks occur.  Talk therapy (psychotherapy). Certain types of talk therapy can be helpful in treating GAD by providing support, education, and guidance. Options include: ? Cognitive behavioral therapy (CBT). People learn coping skills and techniques to ease their anxiety. They learn to identify unrealistic or negative thoughts and behaviors and to replace them with positive ones. ? Acceptance and commitment therapy (ACT). This treatment teaches people how to be mindful as a way to cope with unwanted thoughts and feelings. ? Biofeedback. This process trains you to manage your body's response (physiological response) through breathing techniques and  relaxation methods. You will work with a therapist while machines are used to monitor your physical symptoms.  Stress management techniques. These include yoga, meditation, and exercise. A mental health specialist can help determine which treatment is best for you. Some people see improvement with one type of therapy. However, other people require a combination of therapies. Follow these instructions at home:  Take over-the-counter and prescription medicines only as told by your health care provider.  Try to maintain a normal routine.  Try to anticipate stressful situations and allow extra time to manage them.  Practice any stress management or self-calming techniques as taught by your health care provider.  Do not punish yourself for setbacks or for not making progress.  Try to recognize your accomplishments, even if they are small.  Keep all follow-up visits as told by your health care provider. This is important. Contact a health care provider if:  Your symptoms do not get better.  Your symptoms get worse.  You have signs of depression, such as: ? A persistently sad, cranky, or irritable mood. ? Loss of enjoyment in activities that used to bring you joy. ? Change in weight or eating. ? Changes in sleeping habits. ? Avoiding friends or family members. ? Loss of energy for normal tasks. ? Feelings of guilt or worthlessness.  Get help right away if:  You have serious thoughts about hurting yourself or others. If you ever feel like you may hurt yourself or others, or have thoughts about taking your own life, get help right away. You can go to your nearest emergency department or call:  Your local emergency services (911 in the [place]  A suicide crisis helpline, such as the National Suicide Prevention Lifeline at 609-608-01931-714-646-9787. This is open 24 hours a day. Summary  Generalized anxiety disorder (GAD) is a mental health disorder that involves worry that is not triggered by a  specific event.  People with GAD often worry excessively about many things in their lives, such as their health and family.  GAD may cause physical symptoms such as restlessness, trouble concentrating, sleep problems, frequent sweating, nausea, diarrhea, headaches, and trembling or muscle twitching.  A mental health specialist can help determine which treatment is best for you. Some people see improvement with one type of therapy. However, other people require a combination of therapies. This information is not intended to replace advice given to you by your health care provider. Make sure you discuss any questions you have with your health care provider. Document Released: 04/30/2012 Document Revised: 12/16/2016 Document Reviewed: 11/24/2015 Elsevier Patient Education  2020 [employer] Escitalopram tablets What is this medicine? ESCITALOPRAM (es sye TAL oh pram) is used to treat depression and certain types of anxiety. This medicine may be used for other purposes; ask your health care provider or pharmacist if you have questions. COMMON BRAND NAME(S): Lexapro What should I tell my health care provider before I take this medicine? They need to know if you have any of these conditions:  bipolar disorder or a family history of bipolar disorder  diabetes  glaucoma  heart disease  kidney or liver disease  receiving electroconvulsive therapy  seizures (convulsions)  suicidal thoughts, plans, or attempt by you or a family member  an unusual or allergic reaction to escitalopram, the related drug citalopram, other medicines, foods, dyes, or preservatives  pregnant or trying to become pregnant  breast-feeding How should I use this medicine? Take this medicine by mouth with a glass of water. Follow the directions on the prescription label. You can take it with or without food. If it upsets your stomach, take it with food. Take your medicine at regular intervals. Do not take it more  often than directed. Do not stop taking this medicine suddenly except upon the advice of your doctor. Stopping this medicine too quickly may cause serious side effects or your condition may worsen. A special MedGuide will be given to you by the pharmacist with each prescription and refill. Be sure to read this information carefully each time. Talk to your pediatrician regarding the use of this medicine in children. Special care may be needed. Overdosage: If you think you have taken too much of this medicine contact a poison control center or emergency room at once. NOTE: This medicine is only for you. Do not share this medicine with others. What if I miss a dose? If you miss a dose, take it as soon as you can. If it is almost time for your next dose, take only that dose. Do not take double or extra doses. What may interact with this medicine? Do not take this medicine with any of the following medications:  certain medicines for fungal infections like fluconazole, itraconazole, ketoconazole, posaconazole, voriconazole  cisapride  citalopram  dronedarone  linezolid  MAOIs like Carbex, Eldepryl, Marplan, Nardil,  and Parnate  methylene blue (injected into a vein)  pimozide  thioridazine This medicine may also interact with the following medications:  alcohol  amphetamines  aspirin and aspirin-like medicines  carbamazepine  certain medicines for depression, anxiety, or psychotic disturbances  certain medicines for migraine headache like almotriptan, eletriptan, frovatriptan, naratriptan, rizatriptan, sumatriptan, zolmitriptan  certain medicines for sleep  certain medicines that treat or prevent blood clots like warfarin, enoxaparin, dalteparin  cimetidine  diuretics  dofetilide  fentanyl  furazolidone  isoniazid  lithium  metoprolol  NSAIDs, medicines for pain and inflammation, like ibuprofen or naproxen  other medicines that prolong the QT interval (cause  an abnormal heart rhythm)  procarbazine  rasagiline  supplements like St. John's wort, kava kava, valerian  tramadol  tryptophan  ziprasidone This list may not describe all possible interactions. Give your health care provider a list of all the medicines, herbs, non-prescription drugs, or dietary supplements you use. Also tell them if you smoke, drink alcohol, or use illegal drugs. Some items may interact with your medicine. What should I watch for while using this medicine? Tell your doctor if your symptoms do not get better or if they get worse. Visit your doctor or health care professional for regular checks on your progress. Because it may take several weeks to see the full effects of this medicine, it is important to continue your treatment as prescribed by your doctor. Patients and their families should watch out for new or worsening thoughts of suicide or depression. Also watch out for sudden changes in feelings such as feeling anxious, agitated, panicky, irritable, hostile, aggressive, impulsive, severely restless, overly excited and hyperactive, or not being able to sleep. If this happens, especially at the beginning of treatment or after a change in dose, call your health care professional. Dennis Bast may get drowsy or dizzy. Do not drive, use machinery, or do anything that needs mental alertness until you know how this medicine affects you. Do not stand or sit up quickly, especially if you are an older patient. This reduces the risk of dizzy or fainting spells. Alcohol may interfere with the effect of this medicine. Avoid alcoholic drinks. Your mouth may get dry. Chewing sugarless gum or sucking hard candy, and drinking plenty of water may help. Contact your doctor if the problem does not go away or is severe. What side effects may I notice from receiving this medicine? Side effects that you should report to your doctor or health care professional as soon as possible:  allergic reactions  like skin rash, itching or hives, swelling of the face, lips, or tongue  anxious  black, tarry stools  changes in vision  confusion  elevated mood, decreased need for sleep, racing thoughts, impulsive behavior  eye pain  fast, irregular heartbeat  feeling faint or lightheaded, falls  feeling agitated, angry, or irritable  hallucination, loss of contact with reality  loss of balance or coordination  loss of memory  painful or prolonged erections  restlessness, pacing, inability to keep still  seizures  stiff muscles  suicidal thoughts or other mood changes  trouble sleeping  unusual bleeding or bruising  unusually weak or tired  vomiting Side effects that usually do not require medical attention (report to your doctor or health care professional if they continue or are bothersome):  changes in appetite  change in sex drive or performance  headache  increased sweating  indigestion, nausea  tremors This list may not describe all possible side effects. Call your doctor  for medical advice about side effects. You may report side effects to FDA at 1-800-FDA-1088. Where should I keep my medicine? Keep out of reach of children. Store at room temperature between 15 and 30 degrees C (59 and 86 degrees F). Throw away any unused medicine after the expiration date. NOTE: This sheet is a summary. It may not cover all possible information. If you have questions about this medicine, talk to your doctor, pharmacist, or health care provider.  2020 Elsevier/Gold Standard (2017-12-25 11:21:44)

## 2018-08-07 NOTE — Telephone Encounter (Signed)
Patient recently changed insurance. Will try to resend prescription under new coverage. JML

## 2018-08-07 NOTE — Progress Notes (Signed)
Patient here to follow-up on Orilissa.  Finished trial sample last month and felt like it helped.

## 2018-08-08 ENCOUNTER — Encounter: Payer: Self-pay | Admitting: Certified Nurse Midwife

## 2018-08-09 ENCOUNTER — Other Ambulatory Visit: Payer: Self-pay

## 2018-08-09 MED ORDER — ESCITALOPRAM OXALATE 10 MG PO TABS: 10.0000 mg | ORAL_TABLET | Freq: Every day | ORAL | 1 refills | Status: DC

## 2018-08-09 MED ORDER — ONDANSETRON HCL 4 MG PO TABS: 4.0000 mg | ORAL_TABLET | Freq: Four times a day (QID) | ORAL | 1 refills | Status: DC | PRN

## 2018-08-09 MED ORDER — KETOROLAC TROMETHAMINE 10 MG PO TABS: 10.0000 mg | ORAL_TABLET | Freq: Four times a day (QID) | ORAL | 1 refills | Status: DC | PRN

## 2018-08-09 NOTE — Telephone Encounter (Signed)
Rx Lexapro 10 mg daily P0, dispense 30 tablets, 1 refill.  Rx Toradol 10 mg PO q 6-8 hours PRN, dispense 15 tablets, 1 refill Rx Zofran 4 mg ODT q 6-8 hrs PRN nausea/migraine, dispense 20 tablets, 1 refill

## 2018-08-09 NOTE — Telephone Encounter (Signed)
Patient will call and update insurance with pharmacy, she has refills on Orilissa and will request refill with updated insurance.  Patient is saying she needs new anxiety and headache medication sent in as well. What should I send in?  Thanks. Jennye Moccasin

## 2018-08-09 NOTE — Telephone Encounter (Signed)
Prescriptions have been sent and patient aware via MyChart message.

## 2018-08-27 ENCOUNTER — Other Ambulatory Visit: Payer: Self-pay

## 2018-08-27 ENCOUNTER — Encounter: Payer: Self-pay | Admitting: Certified Nurse Midwife

## 2018-08-27 DIAGNOSIS — Z8669 Personal history of other diseases of the nervous system and sense organs: Secondary | ICD-10-CM

## 2018-08-27 NOTE — Telephone Encounter (Signed)
Ask if patient would like Neurology referral for headache/migraine management options? JML

## 2018-09-06 ENCOUNTER — Encounter: Payer: Self-pay | Admitting: Certified Nurse Midwife

## 2018-09-08 ENCOUNTER — Other Ambulatory Visit: Payer: Self-pay | Admitting: Certified Nurse Midwife

## 2018-09-13 ENCOUNTER — Other Ambulatory Visit: Payer: Self-pay

## 2018-09-13 ENCOUNTER — Encounter: Payer: Self-pay | Admitting: Certified Nurse Midwife

## 2018-09-13 ENCOUNTER — Telehealth: Payer: Self-pay | Admitting: Certified Nurse Midwife

## 2018-09-13 ENCOUNTER — Other Ambulatory Visit (INDEPENDENT_AMBULATORY_CARE_PROVIDER_SITE_OTHER): Payer: BC Managed Care – PPO

## 2018-09-13 ENCOUNTER — Ambulatory Visit (INDEPENDENT_AMBULATORY_CARE_PROVIDER_SITE_OTHER): Payer: BC Managed Care – PPO | Admitting: Certified Nurse Midwife

## 2018-09-13 ENCOUNTER — Other Ambulatory Visit: Payer: Self-pay | Admitting: Certified Nurse Midwife

## 2018-09-13 VITALS — BP 132/81 | HR 95 | Ht 62.0 in | Wt 162.8 lb

## 2018-09-13 DIAGNOSIS — Z3491 Encounter for supervision of normal pregnancy, unspecified, first trimester: Secondary | ICD-10-CM

## 2018-09-13 DIAGNOSIS — N83202 Unspecified ovarian cyst, left side: Secondary | ICD-10-CM

## 2018-09-13 DIAGNOSIS — N912 Amenorrhea, unspecified: Secondary | ICD-10-CM | POA: Diagnosis not present

## 2018-09-13 DIAGNOSIS — R109 Unspecified abdominal pain: Secondary | ICD-10-CM | POA: Diagnosis not present

## 2018-09-13 DIAGNOSIS — Z9851 Tubal ligation status: Secondary | ICD-10-CM

## 2018-09-13 DIAGNOSIS — Z3201 Encounter for pregnancy test, result positive: Secondary | ICD-10-CM

## 2018-09-13 LAB — POCT URINE PREGNANCY: Preg Test, Ur: NEGATIVE

## 2018-09-13 MED ORDER — OXYCODONE-ACETAMINOPHEN 5-325 MG PO TABS: 1.0000 | ORAL_TABLET | Freq: Four times a day (QID) | ORAL | 0 refills | Status: DC | PRN

## 2018-09-13 NOTE — Progress Notes (Signed)
Patient c/o intermittent severe left side pelvic pain x1 week, taking Tylenol and Toradol with no relief.  +HPT today.

## 2018-09-13 NOTE — Progress Notes (Signed)
GYN ENCOUNTER NOTE  Subjective:       Mary Parsons is a 28 y.o. [phone] female is here for gynecologic evaluation of the following issues:  1. Positive home pregnancy test (brought to visit) with history of bilateral tubal ligation-taken today 2. Amenorrhea 3. Abdominal cramping-left sided x one (1) week, no relief with Toradol or Tylenol   Denies difficulty breathing or respiratory distress, chest pain, dysuria, and leg pain or swelling.    Gynecologic History  Patient's last menstrual period was 07/15/2018 (exact date). Period Cycle (Days): 28 Period Duration (Days): 4 Period Pattern: Regular Menstrual Flow: Heavy, Moderate Menstrual Control: Tampon Dysmenorrhea: (!) Severe Dysmenorrhea Symptoms: Cramping  Contraception: tubal ligation  Last Pap: 05/2018. Results were: normal  Obstetric History  OB History  Gravida Para Term Preterm AB Living  3 2 2   1 2   SAB TAB Ectopic Multiple Live Births  1     0 2    # Outcome Date GA Lbr Len/2nd Weight Sex Delivery Anes PTL Lv  3 Term 04/24/18 [id number]  7 lb 0.9 oz (3.2 kg) M CS-LTranv Spinal  LIV  2 SAB 11/18/16 [id number]         1 Term 08/21/15 [id number]  4 lb 2.2 oz (1.878 kg) M CS-Unspec   LIV     Complications: Fetal Intolerance    Past Medical History:  Diagnosis Date  . GERD (gastroesophageal reflux disease)   . Heavy periods     Past Surgical History:  Procedure Laterality Date  . CESAREAN SECTION    . CESAREAN SECTION WITH BILATERAL TUBAL LIGATION Bilateral 04/24/2018   Procedure: REPEAT CESAREAN SECTION WITH BILATERAL TUBAL LIGATION;  Surgeon: Hildred Laserherry, Anika, MD;  Location: [facility];  Service: Obstetrics;  Laterality: Bilateral;  TOB: 12:11 Sex: Female Weight: 7 lb 1 oz  . DILATION AND EVACUATION N/A 11/18/2016   Procedure: DILATATION AND EVACUATION;  Surgeon: Hildred Laserherry, Anika, MD;  Location: [facility];  Service: Gynecology;  Laterality: N/A;  . LAPAROSCOPY N/A 11/18/2016   Procedure: LAPAROSCOPY DIAGNOSTIC;  Surgeon: Hildred Laserherry,  Anika, MD;  Location: [facility];  Service: Gynecology;  Laterality: N/A;  . TONSILLECTOMY      Current Outpatient Medications on File Prior to Visit  Medication Sig Dispense Refill  . escitalopram (LEXAPRO) 10 MG tablet TAKE ONE TABLET BY MOUTH EVERY DAY 30 tablet 1  . ketorolac (TORADOL) 10 MG tablet Take 1 tablet (10 mg total) by mouth every 6 (six) hours as needed. 15 tablet 1   No current facility-administered medications on file prior to visit.     Allergies  Allergen Reactions  . Adhesive [Tape] Other (See Comments)    Burns skin off--tolerates PAPER TAPE ONLY    Social History   Socioeconomic History  . Marital status: Married    Spouse name: Not on file  . Number of children: Not on file  . Years of education: Not on file  . Highest education level: Not on file  Occupational History  . Not on file  Social Needs  . Financial resource strain: Not on file  . Food insecurity    Worry: Not on file    Inability: Not on file  . Transportation needs    Medical: Not on file    Non-medical: Not on file  Tobacco Use  . Smoking status: Never Smoker  . Smokeless tobacco: Never Used  Substance and Sexual Activity  . Alcohol use: Yes    Comment: occasional   . Drug use: No  .  Sexual activity: Yes    Birth control/protection: Surgical  Lifestyle  . Physical activity    Days per week: Not on file    Minutes per session: Not on file  . Stress: Not on file  Relationships  . Social Herbalist on phone: Not on file    Gets together: Not on file    Attends religious service: Not on file    Active member of club or organization: Not on file    Attends meetings of clubs or organizations: Not on file    Relationship status: Not on file  . Intimate partner violence    Fear of current or ex partner: Not on file    Emotionally abused: Not on file    Physically abused: Not on file    Forced sexual activity: Not on file  Other Topics Concern  . Not on file  Social  History Narrative  . Not on file    Family History  Problem Relation Age of Onset  . Endometriosis Mother   . Endometriosis Maternal Aunt   . Endometriosis Maternal Grandmother   . Breast cancer Neg Hx   . Ovarian cancer Neg Hx   . Colon cancer Neg Hx     The following portions of the patient's history were reviewed and updated as appropriate: allergies, current medications, past family history, past medical history, past social history, past surgical history and problem list.  Review of Systems  ROS negative except as noted above. Information obtained from patient.   Objective:   BP 132/81   Pulse 95   Ht 5\' 2"  (1.575 m)   Wt 162 lb 12.8 oz (73.8 kg)   LMP 07/15/2018 (Exact Date)   BMI 29.78 kg/m    CONSTITUTIONAL: Well-developed, well-nourished female in no acute distress.   PHYSICAL EXAM: Not indicated.   Recent Results (from the past 2160 hour(s))  POCT urine pregnancy     Status: None   Collection Time: 09/13/18  2:55 PM  Result Value Ref Range   Preg Test, Ur Negative Negative    ULTRASOUND REPORT  Location: [facility] Date of Service: 09/13/2018   Indications: Positive home pregnancy test after history of bilateral tubal ligation, Abdominal pain and cramping  Findings:   No IUP seen at this time.  Right Ovary is normal in appearance. Left Ovary is not normal appearance.Complex cystic structure  with good through transmission  measuring 3.0 x 2.2 x 2.5 cm. No internal or  peripheral blood flow seen. Corpus luteal cyst:  Left ovary Survey of the adnexa demonstrates no adnexal masses. There is no free peritoneal fluid in the cul de sac.  Impression: 1. No IUP seen at this time or adnexal masses. 2. Lt ovarian complex cyst seen as described in above report.  Recommendations: 1.Clinical correlation with the patient's History and Physical Exam.   Assessment:   1. Positive pregnancy test  - US PELVIS TRANSVAGINAL NON-OB (TV ONLY); Future -  Beta hCG quant (ref lab)  2. History of bilateral tubal ligation  - US PELVIS TRANSVAGINAL NON-OB (TV ONLY); Future - Beta hCG quant (ref lab)  3. Abdominal cramping  - US PELVIS TRANSVAGINAL NON-OB (TV ONLY); Future - Beta hCG quant (ref lab) - POCT urine pregnancy  4. Amenorrhea  - US PELVIS TRANSVAGINAL NON-OB (TV ONLY); Future - Beta hCG quant (ref lab) - POCT urine pregnancy  5. Left ovarian cyst    Plan:   Findings reviewed with patient, verbalized understanding.  Labs: Beta, see orders. Will contact patient with results.   Discussed cyst management options after pregnancy is fully ruled out.   Rx: Percocet, see orders.   Reviewed red flag symptoms and when to call.   RTC x 4-6 weeks for repeat ultrasound or sooner if indicated.    Gunnar Bulla, CNM [facility], [facility] 09/13/18 6:23 PM

## 2018-09-13 NOTE — Telephone Encounter (Signed)
Patient called requesting to speak with you. She had her tubes tied 4 months ago and has a positive pregnancy test. She scheduled an appointment for tomorrow but would still like to speak with you about it.Thanks

## 2018-09-13 NOTE — Patient Instructions (Signed)
Ovarian Cyst An ovarian cyst is a fluid-filled sac on an ovary. The ovaries are organs that make eggs in women. Most ovarian cysts go away on their own and are not cancerous (are benign). Some cysts need treatment. Follow these instructions at home:  Take over-the-counter and prescription medicines only as told by your doctor.  Do not drive or use heavy machinery while taking prescription pain medicine.  Get pelvic exams and Pap tests as often as told by your doctor.  Return to your normal activities as told by your doctor. Ask your doctor what activities are safe for you.  Do not use any products that contain nicotine or tobacco, such as cigarettes and e-cigarettes. If you need help quitting, ask your doctor.  Keep all follow-up visits as told by your doctor. This is important. Contact a doctor if:  Your periods are: ? Late. ? Irregular. ? Painful.   Your periods stop.  You have pelvic pain that does not go away.  You have pressure on your bladder.  You have trouble making your bladder empty when you pee (urinate).  You have pain during sex.  You have any of the following in your belly (abdomen): ? A feeling of fullness. ? Pressure. ? Discomfort. ? Pain that does not go away. ? Swelling.  You feel sick most of the time.  You have trouble pooping (have constipation).  You are not as hungry as usual (you lose your appetite).  You get very bad acne.  You start to have more hair on your body and face.  You are gaining weight or losing weight without changing your exercise and eating habits.  You think you may be pregnant. Get help right away if:  You have belly pain that is very bad or gets worse.  You cannot eat or drink without throwing up (vomiting).  You suddenly get a fever.  Your period is a lot heavier than usual. This information is not intended to replace advice given to you by your health care provider. Make sure you discuss any questions you have  with your health care provider. Document Released: 06/22/2007 Document Revised: 12/16/2016 Document Reviewed: 06/07/2015 Elsevier Patient Education  2020 [employer]  

## 2018-09-13 NOTE — Telephone Encounter (Signed)
Please contact patient. Needs to come in today for UPT, beta level and ultrasound. Thanks, JML

## 2018-09-14 ENCOUNTER — Encounter: Payer: Self-pay | Admitting: Certified Nurse Midwife

## 2018-09-14 ENCOUNTER — Encounter: Payer: BC Managed Care – PPO | Admitting: Certified Nurse Midwife

## 2018-09-14 LAB — BETA HCG QUANT (REF LAB): hCG Quant: <1 m[IU]/mL

## 2018-09-17 ENCOUNTER — Other Ambulatory Visit: Payer: Self-pay | Admitting: Certified Nurse Midwife

## 2018-09-17 DIAGNOSIS — J329 Chronic sinusitis, unspecified: Secondary | ICD-10-CM

## 2018-09-17 MED ORDER — CEFDINIR 300 MG PO CAPS: 300.0000 mg | ORAL_CAPSULE | Freq: Two times a day (BID) | ORAL | 0 refills | Status: AC

## 2018-09-17 NOTE — Progress Notes (Signed)
Rx Omnicef, see orders. Requested for sinus infection. Additional medication will require an office appointment.    Diona Fanti, CNM [facility], [facility] 09/17/18 11:24 AM

## 2018-09-20 ENCOUNTER — Encounter: Payer: BC Managed Care – PPO | Admitting: Certified Nurse Midwife

## 2018-09-26 ENCOUNTER — Encounter: Payer: Self-pay | Admitting: Certified Nurse Midwife

## 2018-09-27 ENCOUNTER — Encounter: Payer: Self-pay | Admitting: Certified Nurse Midwife

## 2018-09-27 ENCOUNTER — Other Ambulatory Visit: Payer: Self-pay

## 2018-09-27 MED ORDER — ESCITALOPRAM OXALATE 10 MG PO TABS: 20.0000 mg | ORAL_TABLET | Freq: Every day | ORAL | 1 refills | Status: DC

## 2018-09-27 MED ORDER — ESCITALOPRAM OXALATE 10 MG PO TABS: 10.0000 mg | ORAL_TABLET | Freq: Every day | ORAL | 1 refills | Status: DC

## 2018-10-01 ENCOUNTER — Encounter: Payer: Self-pay | Admitting: Certified Nurse Midwife

## 2018-10-01 NOTE — Telephone Encounter (Signed)
Are there any openings on the ultrasound schedule this week? JML

## 2018-10-11 ENCOUNTER — Ambulatory Visit (INDEPENDENT_AMBULATORY_CARE_PROVIDER_SITE_OTHER): Payer: BC Managed Care – PPO

## 2018-10-11 ENCOUNTER — Other Ambulatory Visit: Payer: Self-pay

## 2018-10-11 ENCOUNTER — Ambulatory Visit (INDEPENDENT_AMBULATORY_CARE_PROVIDER_SITE_OTHER): Payer: BC Managed Care – PPO | Admitting: Certified Nurse Midwife

## 2018-10-11 ENCOUNTER — Encounter: Payer: Self-pay | Admitting: Certified Nurse Midwife

## 2018-10-11 VITALS — BP 126/80 | HR 67 | Ht 62.0 in | Wt 166.4 lb

## 2018-10-11 DIAGNOSIS — R102 Pelvic and perineal pain: Secondary | ICD-10-CM | POA: Diagnosis not present

## 2018-10-11 DIAGNOSIS — R109 Unspecified abdominal pain: Secondary | ICD-10-CM

## 2018-10-11 DIAGNOSIS — N83202 Unspecified ovarian cyst, left side: Secondary | ICD-10-CM | POA: Diagnosis not present

## 2018-10-11 DIAGNOSIS — Z9851 Tubal ligation status: Secondary | ICD-10-CM

## 2018-10-11 DIAGNOSIS — F419 Anxiety disorder, unspecified: Secondary | ICD-10-CM

## 2018-10-11 DIAGNOSIS — Z3201 Encounter for pregnancy test, result positive: Secondary | ICD-10-CM | POA: Diagnosis not present

## 2018-10-11 DIAGNOSIS — Z98891 History of uterine scar from previous surgery: Secondary | ICD-10-CM

## 2018-10-11 DIAGNOSIS — N912 Amenorrhea, unspecified: Secondary | ICD-10-CM

## 2018-10-11 MED ORDER — ALPRAZOLAM 0.5 MG PO TABS: 0.5000 mg | ORAL_TABLET | Freq: Three times a day (TID) | ORAL | 0 refills | Status: DC | PRN

## 2018-10-11 MED ORDER — AZITHROMYCIN 250 MG PO TABS: ORAL_TABLET | ORAL | 0 refills | Status: DC

## 2018-10-11 NOTE — Progress Notes (Signed)
Patient here to discuss ultrasound report, c/o feeling irritable "for a while".

## 2018-10-11 NOTE — Progress Notes (Signed)
GYN ENCOUNTER NOTE  Subjective:       Mary Parsons is a 28 y.o. [phone] female here for follow up visit regarding management of anxiety and left ovarian cyst.   Notes feeling irritable "for a while". Mild relief with Lexapro.  "Terrible pelvic pain, all the time". Felt like she "was going to have a baby" last night. No relief with POP, Mirena, or Orilissa trials.   Denies difficulty breathing or respiratory distress, chest pain, excessive vaginal bleeding, dysuria, and leg pain.    Gynecologic History  Patient's last menstrual period was 09/26/2018 (exact date). Period Pattern: (!) Irregular  Contraception: tubal ligation  Last Pap: up to date.   Obstetric History  OB History  Gravida Para Term Preterm AB Living  3 2 2   1 2   SAB TAB Ectopic Multiple Live Births  1     0 2    # Outcome Date GA Lbr Len/2nd Weight Sex Delivery Anes PTL Lv  3 Term 04/24/18 [id number]  7 lb 0.9 oz (3.2 kg) M CS-LTranv Spinal  LIV  2 SAB 11/18/16 1350w5d         1 Term 08/21/15 1973w5d  4 lb 2.2 oz (1.878 kg) M CS-Unspec   LIV     Complications: Fetal Intolerance    Past Medical History:  Diagnosis Date  . GERD (gastroesophageal reflux disease)   . Heavy periods     Past Surgical History:  Procedure Laterality Date  . CESAREAN SECTION    . CESAREAN SECTION WITH BILATERAL TUBAL LIGATION Bilateral 04/24/2018   Procedure: REPEAT CESAREAN SECTION WITH BILATERAL TUBAL LIGATION;  Surgeon: Hildred Laserherry, Anika, MD;  Location: [facility];  Service: Obstetrics;  Laterality: Bilateral;  TOB: 12:11 Sex: Female Weight: 7 lb 1 oz  . DILATION AND EVACUATION N/A 11/18/2016   Procedure: DILATATION AND EVACUATION;  Surgeon: Hildred Laserherry, Anika, MD;  Location: [facility];  Service: Gynecology;  Laterality: N/A;  . LAPAROSCOPY N/A 11/18/2016   Procedure: LAPAROSCOPY DIAGNOSTIC;  Surgeon: Hildred Laserherry, Anika, MD;  Location: [facility];  Service: Gynecology;  Laterality: N/A;  . TONSILLECTOMY      Current Outpatient Medications on File  Prior to Visit  Medication Sig Dispense Refill  . escitalopram (LEXAPRO) 10 MG tablet Take 2 tablets (20 mg total) by mouth daily. 30 tablet 1  . ketorolac (TORADOL) 10 MG tablet Take 1 tablet (10 mg total) by mouth every 6 (six) hours as needed. 15 tablet 1  . oxyCODONE-acetaminophen (PERCOCET/ROXICET) 5-325 MG tablet Take 1-2 tablets by mouth every 6 (six) hours as needed. 30 tablet 0   No current facility-administered medications on file prior to visit.     Allergies  Allergen Reactions  . Adhesive [Tape] Other (See Comments)    Burns skin off--tolerates PAPER TAPE ONLY    Social History   Socioeconomic History  . Marital status: Married    Spouse name: Not on file  . Number of children: Not on file  . Years of education: Not on file  . Highest education level: Not on file  Occupational History  . Not on file  Social Needs  . Financial resource strain: Not on file  . Food insecurity    Worry: Not on file    Inability: Not on file  . Transportation needs    Medical: Not on file    Non-medical: Not on file  Tobacco Use  . Smoking status: Never Smoker  . Smokeless tobacco: Never Used  Substance and Sexual Activity  . Alcohol  use: Yes    Comment: occasional   . Drug use: No  . Sexual activity: Yes    Birth control/protection: Surgical  Lifestyle  . Physical activity    Days per week: Not on file    Minutes per session: Not on file  . Stress: Not on file  Relationships  . Social Herbalist on phone: Not on file    Gets together: Not on file    Attends religious service: Not on file    Active member of club or organization: Not on file    Attends meetings of clubs or organizations: Not on file    Relationship status: Not on file  . Intimate partner violence    Fear of current or ex partner: Not on file    Emotionally abused: Not on file    Physically abused: Not on file    Forced sexual activity: Not on file  Other Topics Concern  . Not on file   Social History Narrative  . Not on file    Family History  Problem Relation Age of Onset  . Endometriosis Mother   . Endometriosis Maternal Aunt   . Endometriosis Maternal Grandmother   . Breast cancer Neg Hx   . Ovarian cancer Neg Hx   . Colon cancer Neg Hx     The following portions of the patient's history were reviewed and updated as appropriate: allergies, current medications, past family history, past medical history, past social history, past surgical history and problem list.  Review of Systems  ROS negative except as noted above. Information obtained from patient.   Objective:   BP 126/80   Pulse 67   Ht 5\' 2"  (1.575 m)   Wt 166 lb 6.4 oz (75.5 kg)   LMP 09/26/2018 (Exact Date)   BMI 30.43 kg/m    CONSTITUTIONAL: Well-developed, well-nourished female in no acute distress.   ULTRASOUND REPORT  Location: [facility]  Date of Service: 10/11/2018  Indications:F/U Lt Ovarian cyst  Findings:  The uterus is anteverted and measures 8.2 x 3.4 x 5.2 cm. Echo texture is homogenous without evidence of focal masses.  The Endometrium measures 7 mm.  Right Ovary measures 1.9 x 1.6 x 2.0 cm. It is normal in appearance. Left Ovary measures 2.5 x 1.5 x 1.5  cm. It is normal in appearance. Survey of the adnexa demonstrates no adnexal masses. There is no free fluid in the cul de sac.  Impression: 1. Pelvic ultrasound is WNL at this time.  Recommendations: 1.Clinical correlation with the patient's History and Physical Exam.   Depression screen [facility] 2/9 10/11/2018 08/07/2018 06/07/2018  Decreased Interest 0 0 0  Down, Depressed, Hopeless 1 0 0  PHQ - 2 Score 1 0 0  Altered sleeping 2 1 1   Tired, decreased energy 3 1 1   Change in appetite 0 0 0  Feeling bad or failure about yourself  1 0 0  Trouble concentrating 1 1 0  Moving slowly or fidgety/restless 0 0 0  Suicidal thoughts 0 0 0  PHQ-9 Score 8 3 2   Difficult doing work/chores Somewhat difficult Somewhat  difficult Not difficult at all   GAD 7 : Generalized Anxiety Score 10/11/2018 08/07/2018  Nervous, Anxious, on Edge 1 1  Control/stop worrying 3 1  Worry too much - different things 3 0  Trouble relaxing 3 3  Restless 3 2  Easily annoyed or irritable 3 3  Afraid - awful might happen 0 0  Total  GAD 7 Score 16 10  Anxiety Difficulty Somewhat difficult Somewhat difficult     Assessment:   1. Pelvic pain  - Ambulatory referral to Gynecology  2. Left ovarian cyst   3. Anxiety  - Ambulatory referral to Gynecology  4. Previous cesarean section  - Ambulatory referral to Gynecology    Plan:   Ultrasound findings reviewed with patient, verbalized understanding.   Referral to [facility], see orders.   Continue Lexapro daily. Add Xanax PRN TID, see orders.  Request Rx for sinusitis; Z-pak given, see orders.   Reviewed red flag symptoms and when to call.   RTC as needed.    Gunnar Bulla, CNM [facility], [facility] 10/11/18 3:17 PM

## 2018-10-11 NOTE — Patient Instructions (Addendum)
Azithromycin tablets What is this medicine? AZITHROMYCIN (az ith roe MYE sin) is a macrolide antibiotic. It is used to treat or prevent certain kinds of bacterial infections. It will not work for colds, flu, or other viral infections. This medicine may be used for other purposes; ask your health care provider or pharmacist if you have questions. COMMON BRAND NAME(S): Zithromax, Zithromax Tri-Pak, Zithromax Z-Pak What should I tell my health care provider before I take this medicine? They need to know if you have any of these conditions:  history of blood diseases, like leukemia  history of irregular heartbeat  kidney disease  liver disease  myasthenia gravis  an unusual or allergic reaction to azithromycin, erythromycin, other macrolide antibiotics, foods, dyes, or preservatives  pregnant or trying to get pregnant  breast-feeding How should I use this medicine? Take this medicine by mouth with a full glass of water. Follow the directions on the prescription label. The tablets can be taken with food or on an empty stomach. If the medicine upsets your stomach, take it with food. Take your medicine at regular intervals. Do not take your medicine more often than directed. Take all of your medicine as directed even if you think your are better. Do not skip doses or stop your medicine early. Talk to your pediatrician regarding the use of this medicine in children. While this drug may be prescribed for children as young as 6 months for selected conditions, precautions do apply. Overdosage: If you think you have taken too much of this medicine contact a poison control center or emergency room at once. NOTE: This medicine is only for you. Do not share this medicine with others. What if I miss a dose? If you miss a dose, take it as soon as you can. If it is almost time for your next dose, take only that dose. Do not take double or extra doses. What may interact with this medicine? Do not take  this medicine with any of the following medications:  cisapride  dronedarone  pimozide  thioridazine This medicine may also interact with the following medications:  antacids that contain aluminum or magnesium  birth control pills  colchicine  cyclosporine  digoxin  ergot alkaloids like dihydroergotamine, ergotamine  nelfinavir  other medicines that prolong the QT interval (an abnormal heart rhythm)  phenytoin  warfarin This list may not describe all possible interactions. Give your health care provider a list of all the medicines, herbs, non-prescription drugs, or dietary supplements you use. Also tell them if you smoke, drink alcohol, or use illegal drugs. Some items may interact with your medicine. What should I watch for while using this medicine? Tell your doctor or healthcare provider if your symptoms do not start to get better or if they get worse. This medicine may cause serious skin reactions. They can happen weeks to months after starting the medicine. Contact your healthcare provider right away if you notice fevers or flu-like symptoms with a rash. The rash may be red or purple and then turn into blisters or peeling of the skin. Or, you might notice a red rash with swelling of the face, lips or lymph nodes in your neck or under your arms. Do not treat diarrhea with over the counter products. Contact your doctor if you have diarrhea that lasts more than 2 days or if it is severe and watery. This medicine can make you more sensitive to the sun. Keep out of the sun. If you cannot avoid being in the   sun, wear protective clothing and use sunscreen. Do not use sun lamps or tanning beds/booths. What side effects may I notice from receiving this medicine? Side effects that you should report to your doctor or health care professional as soon as possible:  allergic reactions like skin rash, itching or hives, swelling of the face, lips, or tongue  bloody or watery diarrhea   breathing problems  chest pain  fast, irregular heartbeat  muscle weakness  rash, fever, and swollen lymph nodes  redness, blistering, peeling, or loosening of the skin, including inside the mouth  signs and symptoms of liver injury like dark yellow or brown urine; general ill feeling or flu-like symptoms; light-colored stools; loss of appetite; nausea; right upper belly pain; unusually weak or tired; yellowing of the eyes or skin  white patches or sores in the mouth  unusually weak or tired Side effects that usually do not require medical attention (report to your doctor or health care professional if they continue or are bothersome):  diarrhea  nausea  stomach pain  vomiting This list may not describe all possible side effects. Call your doctor for medical advice about side effects. You may report side effects to FDA at 1-800-FDA-1088. Where should I keep my medicine? Keep out of the reach of children. Store at room temperature between 15 and 30 degrees C (59 and 86 degrees F). Throw away any unused medicine after the expiration date. NOTE: This sheet is a summary. It may not cover all possible information. If you have questions about this medicine, talk to your doctor, pharmacist, or health care provider.  2020 Elsevier/Gold Standard (2018-04-12 17:19:20)   Alprazolam tablets What is this medicine? ALPRAZOLAM (al PRAY zoe lam) is a benzodiazepine. It is used to treat anxiety and panic attacks. This medicine may be used for other purposes; ask your health care provider or pharmacist if you have questions. COMMON BRAND NAME(S): Xanax What should I tell my health care provider before I take this medicine? They need to know if you have any of these conditions:  an alcohol or drug abuse problem  bipolar disorder, depression, psychosis or other mental health conditions  glaucoma  kidney or liver disease  lung or breathing disease  myasthenia gravis  Parkinson's  disease  porphyria  seizures or a history of seizures  suicidal thoughts  an unusual or allergic reaction to alprazolam, other benzodiazepines, foods, dyes, or preservatives  pregnant or trying to get pregnant  breast-feeding How should I use this medicine? Take this medicine by mouth with a glass of water. Follow the directions on the prescription label. Take your medicine at regular intervals. Do not take it more often than directed. Do not stop taking except on your doctor's advice. A special MedGuide will be given to you by the pharmacist with each prescription and refill. Be sure to read this information carefully each time. Talk to your pediatrician regarding the use of this medicine in children. Special care may be needed. Overdosage: If you think you have taken too much of this medicine contact a poison control center or emergency room at once. NOTE: This medicine is only for you. Do not share this medicine with others. What if I miss a dose? If you miss a dose, take it as soon as you can. If it is almost time for your next dose, take only that dose. Do not take double or extra doses. What may interact with this medicine? Do not take this medicine with any of the  following medications:  certain antiviral medicines for HIV or AIDS like delavirdine, indinavir  certain medicines for fungal infections like ketoconazole and itraconazole  narcotic medicines for cough  sodium oxybate This medicine may also interact with the following medications:  alcohol  antihistamines for allergy, cough and cold  certain antibiotics like clarithromycin, erythromycin, isoniazid, rifampin, rifapentine, rifabutin, and troleandomycin  certain medicines for blood pressure, heart disease, irregular heart beat  certain medicines for depression, like amitriptyline, fluoxetine, sertraline  certain medicines for seizures like carbamazepine, oxcarbazepine, phenobarbital, phenytoin, primidone   cimetidine  cyclosporine  female hormones, like estrogens or progestins and birth control pills, patches, rings, or injections  general anesthetics like halothane, isoflurane, methoxyflurane, propofol  grapefruit juice  local anesthetics like lidocaine, pramoxine, tetracaine  medicines that relax muscles for surgery  narcotic medicines for pain  other antiviral medicines for HIV or AIDS  phenothiazines like chlorpromazine, mesoridazine, prochlorperazine, thioridazine This list may not describe all possible interactions. Give your health care provider a list of all the medicines, herbs, non-prescription drugs, or dietary supplements you use. Also tell them if you smoke, drink alcohol, or use illegal drugs. Some items may interact with your medicine. What should I watch for while using this medicine? Tell your doctor or health care professional if your symptoms do not start to get better or if they get worse. Do not stop taking except on your doctor's advice. You may develop a severe reaction. Your doctor will tell you how much medicine to take. You may get drowsy or dizzy. Do not drive, use machinery, or do anything that needs mental alertness until you know how this medicine affects you. To reduce the risk of dizzy and fainting spells, do not stand or sit up quickly, especially if you are an older patient. Alcohol may increase dizziness and drowsiness. Avoid alcoholic drinks. If you are taking another medicine that also causes drowsiness, you may have more side effects. Give your health care provider a list of all medicines you use. Your doctor will tell you how much medicine to take. Do not take more medicine than directed. Call emergency for help if you have problems breathing or unusual sleepiness. What side effects may I notice from receiving this medicine? Side effects that you should report to your doctor or health care professional as soon as possible:  allergic reactions like skin  rash, itching or hives, swelling of the face, lips, or tongue  breathing problems  confusion  loss of balance or coordination  signs and symptoms of low blood pressure like dizziness; feeling faint or lightheaded, falls; unusually weak or tired  suicidal thoughts or other mood changes Side effects that usually do not require medical attention (report to your doctor or health care professional if they continue or are bothersome):  dizziness  dry mouth  nausea, vomiting  tiredness This list may not describe all possible side effects. Call your doctor for medical advice about side effects. You may report side effects to FDA at 1-800-FDA-1088. Where should I keep my medicine? Keep out of the reach of children. This medicine can be abused. Keep your medicine in a safe place to protect it from theft. Do not share this medicine with anyone. Selling or giving away this medicine is dangerous and against the law. Store at room temperature between 20 and 25 degrees C (68 and 77 degrees F). This medicine may cause accidental overdose and death if taken by other adults, children, or pets. Mix any unused medicine with a  substance like cat litter or coffee grounds. Then throw the medicine away in a sealed container like a sealed bag or a coffee can with a lid. Do not use the medicine after the expiration date. NOTE: This sheet is a summary. It may not cover all possible information. If you have questions about this medicine, talk to your doctor, pharmacist, or health care provider.  2020 Elsevier/Gold Standard (2014-10-02 13:47:25)    Generalized Anxiety Disorder, Adult Generalized anxiety disorder (GAD) is a mental health disorder. People with this condition constantly worry about everyday events. Unlike normal anxiety, worry related to GAD is not triggered by a specific event. These worries also do not fade or get better with time. GAD interferes with life functions, including relationships, work,  and school. GAD can vary from mild to severe. People with severe GAD can have intense waves of anxiety with physical symptoms (panic attacks). What are the causes? The exact cause of GAD is not known. What increases the risk? This condition is more likely to develop in:  Women.  People who have a family history of anxiety disorders.  People who are very shy.  People who experience very stressful life events, such as the death of a loved one.  People who have a very stressful family environment. What are the signs or symptoms? People with GAD often worry excessively about many things in their lives, such as their health and family. They may also be overly concerned about:  Doing well at work.  Being on time.  Natural disasters.  Friendships. Physical symptoms of GAD include:  Fatigue.  Muscle tension or having muscle twitches.  Trembling or feeling shaky.  Being easily startled.  Feeling like your heart is pounding or racing.  Feeling out of breath or like you cannot take a deep breath.  Having trouble falling asleep or staying asleep.  Sweating.  Nausea, diarrhea, or irritable bowel syndrome (IBS).  Headaches.  Trouble concentrating or remembering facts.  Restlessness.  Irritability. How is this diagnosed? Your health care provider can diagnose GAD based on your symptoms and medical history. You will also have a physical exam. The health care provider will ask specific questions about your symptoms, including how severe they are, when they started, and if they come and go. Your health care provider may ask you about your use of alcohol or drugs, including prescription medicines. Your health care provider may refer you to a mental health specialist for further evaluation. Your health care provider will do a thorough examination and may perform additional tests to rule out other possible causes of your symptoms. To be diagnosed with GAD, a person must have  anxiety that:  Is out of his or her control.  Affects several different aspects of his or her life, such as work and relationships.  Causes distress that makes him or her unable to take part in normal activities.  Includes at least three physical symptoms of GAD, such as restlessness, fatigue, trouble concentrating, irritability, muscle tension, or sleep problems. Before your health care provider can confirm a diagnosis of GAD, these symptoms must be present more days than they are not, and they must last for six months or longer. How is this treated? The following therapies are usually used to treat GAD:  Medicine. Antidepressant medicine is usually prescribed for long-term daily control. Antianxiety medicines may be added in severe cases, especially when panic attacks occur.  Talk therapy (psychotherapy). Certain types of talk therapy can be helpful in treating GAD  by providing support, education, and guidance. Options include: ? Cognitive behavioral therapy (CBT). People learn coping skills and techniques to ease their anxiety. They learn to identify unrealistic or negative thoughts and behaviors and to replace them with positive ones. ? Acceptance and commitment therapy (ACT). This treatment teaches people how to be mindful as a way to cope with unwanted thoughts and feelings. ? Biofeedback. This process trains you to manage your body's response (physiological response) through breathing techniques and relaxation methods. You will work with a therapist while machines are used to monitor your physical symptoms.  Stress management techniques. These include yoga, meditation, and exercise. A mental health specialist can help determine which treatment is best for you. Some people see improvement with one type of therapy. However, other people require a combination of therapies. Follow these instructions at home:  Take over-the-counter and prescription medicines only as told by your health care  provider.  Try to maintain a normal routine.  Try to anticipate stressful situations and allow extra time to manage them.  Practice any stress management or self-calming techniques as taught by your health care provider.  Do not punish yourself for setbacks or for not making progress.  Try to recognize your accomplishments, even if they are small.  Keep all follow-up visits as told by your health care provider. This is important. Contact a health care provider if:  Your symptoms do not get better.  Your symptoms get worse.  You have signs of depression, such as: ? A persistently sad, cranky, or irritable mood. ? Loss of enjoyment in activities that used to bring you joy. ? Change in weight or eating. ? Changes in sleeping habits. ? Avoiding friends or family members. ? Loss of energy for normal tasks. ? Feelings of guilt or worthlessness. Get help right away if:  You have serious thoughts about hurting yourself or others. If you ever feel like you may hurt yourself or others, or have thoughts about taking your own life, get help right away. You can go to your nearest emergency department or call:  Your local emergency services (911 in the [place]  A suicide crisis helpline, such as the [facility] at 225-637-8230. This is open 24 hours a day. Summary  Generalized anxiety disorder (GAD) is a mental health disorder that involves worry that is not triggered by a specific event.  People with GAD often worry excessively about many things in their lives, such as their health and family.  GAD may cause physical symptoms such as restlessness, trouble concentrating, sleep problems, frequent sweating, nausea, diarrhea, headaches, and trembling or muscle twitching.  A mental health specialist can help determine which treatment is best for you. Some people see improvement with one type of therapy. However, other people require a combination of therapies. This  information is not intended to replace advice given to you by your health care provider. Make sure you discuss any questions you have with your health care provider. Document Released: 04/30/2012 Document Revised: 12/16/2016 Document Reviewed: 11/24/2015 Elsevier Patient Education  [place]. Pelvic Pain, Female Pelvic pain is pain in your lower belly (abdomen), below your belly button and between your hips. The pain may start suddenly (be acute), keep coming back (be recurring), or last a long time (become chronic). Pelvic pain that lasts longer than 6 months is called chronic pelvic pain. There are many causes of pelvic pain. Sometimes the cause of pelvic pain is not known. Follow these instructions at home:  Take over-the-counter and prescription medicines only as told by your doctor.  Rest as told by your doctor.  Do not have sex if it hurts.  Keep a journal of your pelvic pain. Write down: ? When the pain started. ? Where the pain is located. ? What seems to make the pain better or worse, such as food or your period (menstrual cycle). ? Any symptoms you have along with the pain.  Keep all follow-up visits as told by your doctor. This is important. Contact a doctor if:  Medicine does not help your pain.  Your pain comes back.  You have new symptoms.  You have unusual discharge or bleeding from your vagina.  You have a fever or chills.  You are having trouble pooping (constipation).  You have blood in your pee (urine) or poop (stool).  Your pee smells bad.  You feel weak or light-headed. Get help right away if:  You have sudden pain that is very bad.  Your pain keeps getting worse.  You have very bad pain and also have any of these symptoms: ? A fever. ? Feeling sick to your stomach (nausea). ? Throwing up (vomiting). ? Being very sweaty.  You pass out (lose consciousness). Summary  Pelvic pain is pain in your lower belly (abdomen), below your belly  button and between your hips.  There are many possible causes of pelvic pain.  Keep a journal of your pelvic pain. This information is not intended to replace advice given to you by your health care provider. Make sure you discuss any questions you have with your health care provider. Document Released: 06/22/2007 Document Revised: 06/21/2017 Document Reviewed: 06/21/2017 Elsevier Patient Education  2020 [employer].

## 2018-10-17 ENCOUNTER — Encounter: Payer: Self-pay | Admitting: Certified Nurse Midwife

## 2018-11-29 ENCOUNTER — Encounter: Payer: Self-pay | Admitting: Certified Nurse Midwife

## 2018-11-30 ENCOUNTER — Other Ambulatory Visit: Payer: Self-pay

## 2018-11-30 MED ORDER — FLUOXETINE HCL 20 MG PO CAPS: 20.0000 mg | ORAL_CAPSULE | Freq: Every day | ORAL | 1 refills | Status: DC

## 2018-11-30 NOTE — Telephone Encounter (Signed)
Please contact patient, advise to try taking two (2) pill of xanax prior to bed for sleep. May Start Prozac 20 mg PO daily for anxiety-send Rx. Needs to wean Lexapro taking 10 mg PO daily for one week, followed by 5 mg PO daily for one week, follow by 5 mg PO every other day prior to stopping. Thanks, JML

## 2018-12-01 ENCOUNTER — Other Ambulatory Visit: Payer: Self-pay | Admitting: Certified Nurse Midwife

## 2018-12-19 ENCOUNTER — Ambulatory Visit (INDEPENDENT_AMBULATORY_CARE_PROVIDER_SITE_OTHER)
Admission: RE | Admit: 2018-12-19 | Discharge: 2018-12-19 | Disposition: A | Discharge status: Other Acute Inpatient Hospital | Payer: Self-pay | Source: Ambulatory Visit

## 2018-12-19 ENCOUNTER — Ambulatory Visit
Admission: EM | Admit: 2018-12-19 | Discharge: 2018-12-19 | Disposition: A | Discharge status: Home / Self Care | Payer: BC Managed Care – PPO | Attending: Family Medicine | Admitting: Family Medicine

## 2018-12-19 ENCOUNTER — Encounter: Payer: Self-pay | Admitting: Emergency Medicine

## 2018-12-19 ENCOUNTER — Other Ambulatory Visit: Payer: Self-pay

## 2018-12-19 DIAGNOSIS — J029 Acute pharyngitis, unspecified: Secondary | ICD-10-CM

## 2018-12-19 DIAGNOSIS — U071 COVID-19: Secondary | ICD-10-CM | POA: Diagnosis not present

## 2018-12-19 DIAGNOSIS — R05 Cough: Secondary | ICD-10-CM

## 2018-12-19 DIAGNOSIS — R0981 Nasal congestion: Secondary | ICD-10-CM

## 2018-12-19 LAB — RAPID STREP SCREEN (MED CTR MEBANE ONLY): Streptococcus, Group A Screen (Direct): NEGATIVE

## 2018-12-19 LAB — SARS CORONAVIRUS 2 AG (30 MIN TAT): SARS Coronavirus 2 Ag: POSITIVE — AB

## 2018-12-19 MED ORDER — AMOXICILLIN-POT CLAVULANATE 875-125 MG PO TABS: 1.0000 | ORAL_TABLET | Freq: Two times a day (BID) | ORAL | 0 refills | Status: DC

## 2018-12-19 NOTE — ED Triage Notes (Signed)
Patient c/o sore throat, cough and nasal congestion that started 5 days ago. Denies fever.

## 2018-12-19 NOTE — Discharge Instructions (Addendum)
Come to the urgent care to be seen in person.

## 2018-12-19 NOTE — ED Provider Notes (Signed)
[facility]    CSN: [id number] Arrival date & time: 12/19/18  1518      History   Chief Complaint Chief Complaint  Patient presents with  . Sore Throat  . Cough  . Nasal Congestion   HPI  28 year old female presents with the above complaints.  Patient reports a 5-day history of symptoms.  She reports sore throat, cough, nasal congestion.  No fever.  No reported sick contacts.  Patient has a history of strep throat and also has a history of sinusitis.  She is concerned that she may have either 1 of these entities.  Rates her pain a 6/10 in severity.  No known exacerbating or relieving factors.  No other complaints.  PMH, Surgical Hx, Family Hx, Social History reviewed and updated as below.  PMH: Patient Active Problem List   Diagnosis Date Noted  . Left ovarian cyst 09/13/2018  . Endometriosis 02/01/2018  . History of miscarriage 10/12/2017  . History of C-section 10/09/2017  . Pelvic pain 09/25/2016  . Dyspareunia in female 09/25/2016  . Family history of endometriosis 08/15/2016  . Eczema 11/11/2013    Past Surgical History:  Procedure Laterality Date  . CESAREAN SECTION    . CESAREAN SECTION WITH BILATERAL TUBAL LIGATION Bilateral 04/24/2018   Procedure: REPEAT CESAREAN SECTION WITH BILATERAL TUBAL LIGATION;  Surgeon: Hildred Laserherry, Anika, MD;  Location: [facility];  Service: Obstetrics;  Laterality: Bilateral;  TOB: 12:11 Sex: Female Weight: 7 lb 1 oz  . DILATION AND EVACUATION N/A 11/18/2016   Procedure: DILATATION AND EVACUATION;  Surgeon: Hildred Laserherry, Anika, MD;  Location: [facility];  Service: Gynecology;  Laterality: N/A;  . LAPAROSCOPY N/A 11/18/2016   Procedure: LAPAROSCOPY DIAGNOSTIC;  Surgeon: Hildred Laserherry, Anika, MD;  Location: [facility];  Service: Gynecology;  Laterality: N/A;  . TONSILLECTOMY      OB History    Gravida  3   Para  2   Term  2   Preterm      AB  1   Living  2     SAB  1   TAB      Ectopic      Multiple  0   Live Births  2           Home Medications    Prior to Admission medications   Medication Sig Start Date End Date Taking? Authorizing Provider  FLUoxetine (PROZAC) 20 MG capsule Take 1 capsule (20 mg total) by mouth daily. 11/30/18  Yes Lawhorn, Vanessa DurhamJenkins Michelle, CNM  ketorolac (TORADOL) 10 MG tablet TAKE ONE TABLET BY MOUTH EVERY 6 HOURS 12/03/18  Yes Lawhorn, Vanessa DurhamJenkins Michelle, CNM  escitalopram (LEXAPRO) 10 MG tablet Take 2 tablets (20 mg total) by mouth daily. 09/27/18 12/19/18  Gunnar BullaLawhorn, Jenkins Michelle, CNM    Family History Family History  Problem Relation Age of Onset  . Endometriosis Mother   . Endometriosis Maternal Aunt   . Endometriosis Maternal Grandmother   . Breast cancer Neg Hx   . Ovarian cancer Neg Hx   . Colon cancer Neg Hx     Social History Social History   Tobacco Use  . Smoking status: Never Smoker  . Smokeless tobacco: Never Used  Substance Use Topics  . Alcohol use: Yes    Comment: occasional   . Drug use: No     Allergies   Adhesive [tape]   Review of Systems Review of Systems  Constitutional: Negative for fever.  HENT: Positive for congestion and sore throat.   Respiratory:  Positive for cough.    Physical Exam Triage Vital Signs ED Triage Vitals  Enc Vitals Group     BP 12/19/18 1610 120/72     Pulse Rate 12/19/18 1610 65     Resp 12/19/18 1610 18     Temp 12/19/18 1610 98 F (36.7 C)     Temp Source 12/19/18 1610 Oral     SpO2 12/19/18 1610 100 %     Weight 12/19/18 1601 155 lb (70.3 kg)     Height 12/19/18 1601 5\' 2"  (1.575 m)     Head Circumference --      Peak Flow --      Pain Score 12/19/18 1600 6     Pain Loc --      Pain Edu? --      Excl. in GC? --    Updated Vital Signs BP 120/72 (BP Location: Right Arm)   Pulse 65   Temp 98 F (36.7 C) (Oral)   Resp 18   Ht 5\' 2"  (1.575 m)   Wt 70.3 kg   SpO2 100%   BMI 28.35 kg/m   Visual Acuity Right Eye Distance:   Left Eye Distance:   Bilateral Distance:    Right Eye Near:   Left  Eye Near:    Bilateral Near:     Physical Exam Constitutional:      General: She is not in acute distress.    Appearance: Normal appearance. She is not ill-appearing.  HENT:     Head: Normocephalic and atraumatic.     Ears:     Comments: Right TM with effusion.    Nose: Congestion present.     Mouth/Throat:     Pharynx: Oropharynx is clear. No oropharyngeal exudate.  Eyes:     General:        Right eye: No discharge.        Left eye: No discharge.     Conjunctiva/sclera: Conjunctivae normal.  Cardiovascular:     Rate and Rhythm: Normal rate and regular rhythm.     Heart sounds: No murmur.  Pulmonary:     Effort: Pulmonary effort is normal.     Breath sounds: Normal breath sounds. No wheezing, rhonchi or rales.  Neurological:     Mental Status: She is alert.  Psychiatric:        Mood and Affect: Mood normal.        Behavior: Behavior normal.    UC Treatments / Results  Labs (all labs ordered are listed, but only abnormal results are displayed) Labs Reviewed  SARS CORONAVIRUS 2 AG (30 MIN TAT) - Abnormal; Notable for the following components:      Result Value   SARS Coronavirus 2 Ag POSITIVE (*)    All other components within normal limits  RAPID STREP SCREEN ([facility] ONLY)  CULTURE, GROUP A STREP [facility])   Radiology No results found.  Procedures Procedures (including critical care time)  Medications Ordered in UC Medications - No data to display  Initial Impression / Assessment and Plan / UC Course  I have reviewed the triage vital signs and the nursing notes.  Pertinent labs & imaging results that were available during my care of the patient were reviewed by me and considered in my medical decision making (see chart for details).    28 year old female presents with COVID-19.  Rapid strep negative.  Positive rapid Covid test.  Supportive care and over-the-counter treatments as desired.  Close monitoring.  Final Clinical  Impressions(s) / UC Diagnoses    Final diagnoses:  [id number]   Discharge Instructions   None    ED Prescriptions    Medication Sig Dispense Auth. Provider   amoxicillin-clavulanate (AUGMENTIN) 875-125 MG tablet  (Status: Discontinued) Take 1 tablet by mouth every 12 (twelve) hours. 20 tablet Coral Spikes, DO     PDMP not reviewed this encounter.   Coral Spikes, [place] 12/19/18 [phone]

## 2018-12-19 NOTE — ED Provider Notes (Signed)
Patient has decided to be seen in person and will come to the Urgent Care.    Sharion Balloon, NP 12/19/18 1353

## 2018-12-22 LAB — CULTURE, GROUP A STREP (THRC)

## 2018-12-25 ENCOUNTER — Ambulatory Visit
Admission: EM | Admit: 2018-12-25 | Discharge: 2018-12-25 | Discharge status: Absent without leave | Payer: BC Managed Care – PPO | Attending: Family Medicine | Admitting: Family Medicine

## 2018-12-25 ENCOUNTER — Telehealth: Payer: Self-pay | Admitting: Family Medicine

## 2018-12-25 MED ORDER — AMOXICILLIN-POT CLAVULANATE 875-125 MG PO TABS: 1.0000 | ORAL_TABLET | Freq: Two times a day (BID) | ORAL | 0 refills | Status: DC

## 2018-12-25 NOTE — Telephone Encounter (Signed)
Persistent ear pain. Sending in Augmentin.  [facility]

## 2018-12-25 NOTE — ED Provider Notes (Signed)
Patient left without being seen.  [facility]    Coral Spikes, [place] 12/25/18 [phone]

## 2019-02-13 ENCOUNTER — Other Ambulatory Visit: Payer: Self-pay | Admitting: Certified Nurse Midwife

## 2019-03-22 ENCOUNTER — Other Ambulatory Visit: Payer: Self-pay

## 2019-03-22 ENCOUNTER — Encounter: Payer: Self-pay | Admitting: Certified Nurse Midwife

## 2019-03-22 ENCOUNTER — Other Ambulatory Visit (HOSPITAL_COMMUNITY)
Admission: RE | Admit: 2019-03-22 | Discharge: 2019-03-22 | Disposition: A | Discharge status: Home / Self Care | Payer: BC Managed Care – PPO | Source: Ambulatory Visit | Attending: Certified Nurse Midwife | Admitting: Certified Nurse Midwife

## 2019-03-22 ENCOUNTER — Ambulatory Visit: Payer: BC Managed Care – PPO | Admitting: Certified Nurse Midwife

## 2019-03-22 VITALS — BP 101/54 | HR 71 | Ht 62.0 in | Wt 170.4 lb

## 2019-03-22 DIAGNOSIS — N923 Ovulation bleeding: Secondary | ICD-10-CM | POA: Diagnosis not present

## 2019-03-22 DIAGNOSIS — N760 Acute vaginitis: Secondary | ICD-10-CM | POA: Insufficient documentation

## 2019-03-22 DIAGNOSIS — F419 Anxiety disorder, unspecified: Secondary | ICD-10-CM | POA: Diagnosis not present

## 2019-03-22 DIAGNOSIS — N920 Excessive and frequent menstruation with regular cycle: Secondary | ICD-10-CM | POA: Insufficient documentation

## 2019-03-22 NOTE — Progress Notes (Signed)
GYN ENCOUNTER NOTE  Subjective:       Mary Parsons is a 29 y.o. [phone] female is here for gynecologic evaluation of the following issues:  1. Spotting between menses 2. Wishes to lower dose of Prozac, no longer taking Xanax  Considering pregnancy despite tubal, questions options for IVF.   Denies difficulty breathing or respiratory distress, chest pain, abdominal pain, excessive vaginal bleeding, dysuria, and leg pain or swelling.    Gynecologic History  Patient's last menstrual period was 03/13/2019 (exact date).  Contraception: tubal ligation  Last Pap: 05/2018. Results were: normal  Obstetric History  OB History  Gravida Para Term Preterm AB Living  3 2 2   1 2   SAB TAB Ectopic Multiple Live Births  1     0 2    # Outcome Date GA Lbr Len/2nd Weight Sex Delivery Anes PTL Lv  3 Term 04/24/18 [redacted]w[redacted]d  7 lb 0.9 oz (3.2 kg) M CS-LTranv Spinal  LIV  2 SAB 11/18/16 [redacted]w[redacted]d         1 Term 08/21/15 [redacted]w[redacted]d  4 lb 2.2 oz (1.878 kg) M CS-Unspec   LIV     Complications: Fetal Intolerance    Past Medical History:  Diagnosis Date  . GERD (gastroesophageal reflux disease)   . Heavy periods     Past Surgical History:  Procedure Laterality Date  . CESAREAN SECTION    . CESAREAN SECTION WITH BILATERAL TUBAL LIGATION Bilateral 04/24/2018   Procedure: REPEAT CESAREAN SECTION WITH BILATERAL TUBAL LIGATION;  Surgeon: 06/24/2018, MD;  Location: [facility];  Service: Obstetrics;  Laterality: Bilateral;  TOB: 12:11 Sex: Female Weight: 7 lb 1 oz  . DILATION AND EVACUATION N/A 11/18/2016   Procedure: DILATATION AND EVACUATION;  Surgeon: 13/02/2016, MD;  Location: [facility];  Service: Gynecology;  Laterality: N/A;  . LAPAROSCOPY N/A 11/18/2016   Procedure: LAPAROSCOPY DIAGNOSTIC;  Surgeon: 13/02/2016, MD;  Location: [facility];  Service: Gynecology;  Laterality: N/A;  . TONSILLECTOMY      Current Outpatient Medications on File Prior to Visit  Medication Sig Dispense Refill  . ALPRAZolam  (XANAX) 0.5 MG tablet Take 0.5 mg by mouth as needed.    Hildred Laser FLUoxetine (PROZAC) 20 MG capsule TAKE 1 CAPSULE EVERY DAY 30 capsule 1  . ketorolac (TORADOL) 10 MG tablet TAKE ONE TABLET BY MOUTH EVERY 6 HOURS 15 tablet 1  . [DISCONTINUED] escitalopram (LEXAPRO) 10 MG tablet Take 2 tablets (20 mg total) by mouth daily. 30 tablet 1   No current facility-administered medications on file prior to visit.    Allergies  Allergen Reactions  . Adhesive [Tape] Other (See Comments)    Burns skin off--tolerates PAPER TAPE ONLY    Social History   Socioeconomic History  . Marital status: Married    Spouse name: Not on file  . Number of children: Not on file  . Years of education: Not on file  . Highest education level: Not on file  Occupational History  . Not on file  Tobacco Use  . Smoking status: Never Smoker  . Smokeless tobacco: Never Used  Substance and Sexual Activity  . Alcohol use: Yes    Comment: occasional   . Drug use: No  . Sexual activity: Yes    Birth control/protection: Surgical    Comment: Tubal ligation  Other Topics Concern  . Not on file  Social History Narrative  . Not on file   Social Determinants of Health   Financial Resource Strain:   .  Difficulty of Paying Living Expenses: Not on file  Food Insecurity:   . Worried About Charity fundraiser in the Last Year: Not on file  . Ran Out of Food in the Last Year: Not on file  Transportation Needs:   . Lack of Transportation (Medical): Not on file  . Lack of Transportation (Non-Medical): Not on file  Physical Activity:   . Days of Exercise per Week: Not on file  . Minutes of Exercise per Session: Not on file  Stress:   . Feeling of Stress : Not on file  Social Connections:   . Frequency of Communication with Friends and Family: Not on file  . Frequency of Social Gatherings with Friends and Family: Not on file  . Attends Religious Services: Not on file  . Active Member of Clubs or Organizations: Not on file   . Attends Archivist Meetings: Not on file  . Marital Status: Not on file  Intimate Partner Violence:   . Fear of Current or Ex-Partner: Not on file  . Emotionally Abused: Not on file  . Physically Abused: Not on file  . Sexually Abused: Not on file    Family History  Problem Relation Age of Onset  . Endometriosis Mother   . Endometriosis Maternal Aunt   . Endometriosis Maternal Grandmother   . Breast cancer Neg Hx   . Ovarian cancer Neg Hx   . Colon cancer Neg Hx     The following portions of the patient's history were reviewed and updated as appropriate: allergies, current medications, past family history, past medical history, past social history, past surgical history and problem list.  Review of Systems  Review of Systems - Negative except as noted above. History obtained from the patient  Objective:   BP (!) 101/54   Pulse 71   Ht 5\' 2"  (1.575 m)   Wt 170 lb 6.4 oz (77.3 kg)   LMP 03/13/2019 (Exact Date)   Breastfeeding No   BMI 31.17 kg/m    CONSTITUTIONAL: Well-developed, well-nourished female in no acute distress.   PELVIC:  External Genitalia: Normal  Vagina: Normal  Cervix: Normal, swab collected   MUSCULOSKELETAL: Normal range of motion. No tenderness.  No cyanosis, clubbing, or edema.  Depression screen [facility] 2/9 03/22/2019 10/11/2018 08/07/2018 06/07/2018  Decreased Interest 0 0 0 0  Down, Depressed, Hopeless 0 1 0 0  PHQ - 2 Score 0 1 0 0  Altered sleeping 1 2 1 1   Tired, decreased energy 1 3 1 1   Change in appetite 0 0 0 0  Feeling bad or failure about yourself  1 1 0 0  Trouble concentrating 1 1 1  0  Moving slowly or fidgety/restless 1 0 0 0  Suicidal thoughts 0 0 0 0  PHQ-9 Score 5 8 3 2   Difficult doing work/chores Somewhat difficult Somewhat difficult Somewhat difficult Not difficult at all   GAD 7 : Generalized Anxiety Score 03/22/2019 10/11/2018 08/07/2018  Nervous, Anxious, on Edge 2 1 1   Control/stop worrying 1 3 1   Worry too  much - different things 1 3 0  Trouble relaxing 2 3 3   Restless 2 3 2   Easily annoyed or irritable 3 3 3   Afraid - awful might happen 0 0 0  Total GAD 7 Score 11 16 10   Anxiety Difficulty Somewhat difficult Somewhat difficult Somewhat difficult    Assessment:   1. Spotting between menses  - Cervicovaginal ancillary only  2. Anxiety   Plan:  Vaginal swab collected, see orders  Rx Prozac, see orders  Will research IVF options and contact patient via MyChart  Reviewed red flag symptoms and when to call  RTC as needed   Gunnar Bulla, CNM [facility], [facility]

## 2019-03-22 NOTE — Patient Instructions (Signed)
Fluoxetine capsules or tablets (Depression/Mood Disorders) What is this medicine? FLUOXETINE (floo OX e teen) belongs to a class of drugs known as selective serotonin reuptake inhibitors (SSRIs). It helps to treat mood problems such as depression, obsessive compulsive disorder, and panic attacks. It can also treat certain eating disorders. This medicine may be used for other purposes; ask your health care provider or pharmacist if you have questions. COMMON BRAND NAME(S): Prozac What should I tell my health care provider before I take this medicine? They need to know if you have any of these conditions:  bipolar disorder or a family history of bipolar disorder  bleeding disorders  glaucoma  heart disease  liver disease  low levels of sodium in the blood  seizures  suicidal thoughts, plans, or attempt; a previous suicide attempt by you or a family member  take MAOIs like Carbex, Eldepryl, Marplan, Nardil, and Parnate  take medicines that treat or prevent blood clots  thyroid disease  an unusual or allergic reaction to fluoxetine, other medicines, foods, dyes, or preservatives  pregnant or trying to get pregnant  breast-feeding How should I use this medicine? Take this medicine by mouth with a glass of water. Follow the directions on the prescription label. You can take this medicine with or without food. Take your medicine at regular intervals. Do not take it more often than directed. Do not stop taking this medicine suddenly except upon the advice of your doctor. Stopping this medicine too quickly may cause serious side effects or your condition may worsen. A special MedGuide will be given to you by the pharmacist with each prescription and refill. Be sure to read this information carefully each time. Talk to your pediatrician regarding the use of this medicine in children. While this drug may be prescribed for children as young as 7 years for selected conditions, precautions do  apply. Overdosage: If you think you have taken too much of this medicine contact a poison control center or emergency room at once. NOTE: This medicine is only for you. Do not share this medicine with others. What if I miss a dose? If you miss a dose, skip the missed dose and go back to your regular dosing schedule. Do not take double or extra doses. What may interact with this medicine? Do not take this medicine with any of the following medications:  other medicines containing fluoxetine, like Sarafem or Symbyax  cisapride  dronedarone  linezolid  MAOIs like Carbex, Eldepryl, Marplan, Nardil, and Parnate  methylene blue (injected into a vein)  pimozide  thioridazine This medicine may also interact with the following medications:  alcohol  amphetamines  aspirin and aspirin-like medicines  carbamazepine  certain medicines for depression, anxiety, or psychotic disturbances  certain medicines for migraine headaches like almotriptan, eletriptan, frovatriptan, naratriptan, rizatriptan, sumatriptan, zolmitriptan  digoxin  diuretics  fentanyl  flecainide  furazolidone  isoniazid  lithium  medicines for sleep  medicines that treat or prevent blood clots like warfarin, enoxaparin, and dalteparin  NSAIDs, medicines for pain and inflammation, like ibuprofen or naproxen  other medicines that prolong the QT interval (an abnormal heart rhythm)  phenytoin  procarbazine  propafenone  rasagiline  ritonavir  supplements like St. John's wort, kava kava, valerian  tramadol  tryptophan  vinblastine This list may not describe all possible interactions. Give your health care provider a list of all the medicines, herbs, non-prescription drugs, or dietary supplements you use. Also tell them if you smoke, drink alcohol, or use illegal drugs.   Some items may interact with your medicine. What should I watch for while using this medicine? Tell your doctor if your  symptoms do not get better or if they get worse. Visit your doctor or health care professional for regular checks on your progress. Because it may take several weeks to see the full effects of this medicine, it is important to continue your treatment as prescribed by your doctor. Patients and their families should watch out for new or worsening thoughts of suicide or depression. Also watch out for sudden changes in feelings such as feeling anxious, agitated, panicky, irritable, hostile, aggressive, impulsive, severely restless, overly excited and hyperactive, or not being able to sleep. If this happens, especially at the beginning of treatment or after a change in dose, call your health care professional. Bonita Quin may get drowsy or dizzy. Do not drive, use machinery, or do anything that needs mental alertness until you know how this medicine affects you. Do not stand or sit up quickly, especially if you are an older patient. This reduces the risk of dizzy or fainting spells. Alcohol may interfere with the effect of this medicine. Avoid alcoholic drinks. Your mouth may get dry. Chewing sugarless gum or sucking hard candy, and drinking plenty of water may help. Contact your doctor if the problem does not go away or is severe. This medicine may affect blood sugar levels. If you have diabetes, check with your doctor or health care professional before you change your diet or the dose of your diabetic medicine. What side effects may I notice from receiving this medicine? Side effects that you should report to your doctor or health care professional as soon as possible:  allergic reactions like skin rash, itching or hives, swelling of the face, lips, or tongue  anxious  black, tarry stools  breathing problems  changes in vision  confusion  elevated mood, decreased need for sleep, racing thoughts, impulsive behavior  eye pain  fast, irregular heartbeat  feeling faint or lightheaded, falls  feeling  agitated, angry, or irritable  hallucination, loss of contact with reality  loss of balance or coordination  loss of memory  painful or prolonged erections  restlessness, pacing, inability to keep still  seizures  stiff muscles  suicidal thoughts or other mood changes  trouble sleeping  unusual bleeding or bruising  unusually weak or tired  vomiting Side effects that usually do not require medical attention (report to your doctor or health care professional if they continue or are bothersome):  change in appetite or weight  change in sex drive or performance  diarrhea  dry mouth  headache  increased sweating  nausea  tremors This list may not describe all possible side effects. Call your doctor for medical advice about side effects. You may report side effects to FDA at 1-800-FDA-1088. Where should I keep my medicine? Keep out of the reach of children. Store at room temperature between 15 and 30 degrees C (59 and 86 degrees F). Throw away any unused medicine after the expiration date. NOTE: This sheet is a summary. It may not cover all possible information. If you have questions about this medicine, talk to your doctor, pharmacist, or health care provider.  2020 Elsevier/Gold Standard (2017-08-24 11:56:53) Pelvic Pain, Female Pelvic pain is pain in your lower belly (abdomen), below your belly button and between your hips. The pain may start suddenly (be acute), keep coming back (be recurring), or last a long time (become chronic). Pelvic pain that lasts longer than  6 months is called chronic pelvic pain. There are many causes of pelvic pain. Sometimes the cause of pelvic pain is not known. Follow these instructions at home:   Take over-the-counter and prescription medicines only as told by your doctor.  Rest as told by your doctor.  Do not have sex if it hurts.  Keep a journal of your pelvic pain. Write down: ? When the pain started. ? Where the pain is  located. ? What seems to make the pain better or worse, such as food or your period (menstrual cycle). ? Any symptoms you have along with the pain.  Keep all follow-up visits as told by your doctor. This is important. Contact a doctor if:  Medicine does not help your pain.  Your pain comes back.  You have new symptoms.  You have unusual discharge or bleeding from your vagina.  You have a fever or chills.  You are having trouble pooping (constipation).  You have blood in your pee (urine) or poop (stool).  Your pee smells bad.  You feel weak or light-headed. Get help right away if:  You have sudden pain that is very bad.  Your pain keeps getting worse.  You have very bad pain and also have any of these symptoms: ? A fever. ? Feeling sick to your stomach (nausea). ? Throwing up (vomiting). ? Being very sweaty.  You pass out (lose consciousness). Summary  Pelvic pain is pain in your lower belly (abdomen), below your belly button and between your hips.  There are many possible causes of pelvic pain.  Keep a journal of your pelvic pain. This information is not intended to replace advice given to you by your health care provider. Make sure you discuss any questions you have with your health care provider. Document Revised: 06/21/2017 Document Reviewed: 06/21/2017 Elsevier Patient Education  [employer].

## 2019-03-22 NOTE — Progress Notes (Signed)
Patient here to follow-up on anxiety, feels some what better but "it just depends on the week".  Patient c/o noticing red light bleeding when wiping since this monring, some pelvic cramping x2 days.

## 2019-03-24 MED ORDER — FLUOXETINE HCL 10 MG PO CAPS: 10.0000 mg | ORAL_CAPSULE | Freq: Every day | ORAL | 3 refills | Status: DC

## 2019-03-26 LAB — CERVICOVAGINAL ANCILLARY ONLY
Bacterial Vaginitis (gardnerella): POSITIVE — AB
Candida Glabrata: NEGATIVE
Candida Vaginitis: NEGATIVE
Comment: NEGATIVE
Comment: NEGATIVE
Comment: NEGATIVE

## 2019-03-27 ENCOUNTER — Other Ambulatory Visit: Payer: Self-pay | Admitting: Certified Nurse Midwife

## 2019-03-27 ENCOUNTER — Other Ambulatory Visit: Payer: Self-pay

## 2019-03-27 DIAGNOSIS — N939 Abnormal uterine and vaginal bleeding, unspecified: Secondary | ICD-10-CM

## 2019-03-27 MED ORDER — METRONIDAZOLE 500 MG PO TABS: 500.0000 mg | ORAL_TABLET | Freq: Two times a day (BID) | ORAL | 0 refills | Status: DC

## 2019-03-28 ENCOUNTER — Encounter: Payer: Self-pay | Admitting: Certified Nurse Midwife

## 2019-03-28 ENCOUNTER — Ambulatory Visit (INDEPENDENT_AMBULATORY_CARE_PROVIDER_SITE_OTHER): Payer: BC Managed Care – PPO | Admitting: Certified Nurse Midwife

## 2019-03-28 ENCOUNTER — Other Ambulatory Visit: Payer: Self-pay

## 2019-03-28 ENCOUNTER — Ambulatory Visit (INDEPENDENT_AMBULATORY_CARE_PROVIDER_SITE_OTHER): Payer: BC Managed Care – PPO

## 2019-03-28 VITALS — BP 107/67 | HR 69 | Ht 62.0 in | Wt 172.3 lb

## 2019-03-28 DIAGNOSIS — R102 Pelvic and perineal pain: Secondary | ICD-10-CM

## 2019-03-28 DIAGNOSIS — N939 Abnormal uterine and vaginal bleeding, unspecified: Secondary | ICD-10-CM

## 2019-03-28 NOTE — Patient Instructions (Signed)
Pelvic Pain, Female Pelvic pain is pain in your lower belly (abdomen), below your belly button and between your hips. The pain may start suddenly (be acute), keep coming back (be recurring), or last a long time (become chronic). Pelvic pain that lasts longer than 6 months is called chronic pelvic pain. There are many causes of pelvic pain. Sometimes the cause of pelvic pain is not known. Follow these instructions at home:   Take over-the-counter and prescription medicines only as told by your doctor.  Rest as told by your doctor.  Do not have sex if it hurts.  Keep a journal of your pelvic pain. Write down: ? When the pain started. ? Where the pain is located. ? What seems to make the pain better or worse, such as food or your period (menstrual cycle). ? Any symptoms you have along with the pain.  Keep all follow-up visits as told by your doctor. This is important. Contact a doctor if:  Medicine does not help your pain.  Your pain comes back.  You have new symptoms.  You have unusual discharge or bleeding from your vagina.  You have a fever or chills.  You are having trouble pooping (constipation).  You have blood in your pee (urine) or poop (stool).  Your pee smells bad.  You feel weak or light-headed. Get help right away if:  You have sudden pain that is very bad.  Your pain keeps getting worse.  You have very bad pain and also have any of these symptoms: ? A fever. ? Feeling sick to your stomach (nausea). ? Throwing up (vomiting). ? Being very sweaty.  You pass out (lose consciousness). Summary  Pelvic pain is pain in your lower belly (abdomen), below your belly button and between your hips.  There are many possible causes of pelvic pain.  Keep a journal of your pelvic pain. This information is not intended to replace advice given to you by your health care provider. Make sure you discuss any questions you have with your health care provider. Document  Revised: 06/21/2017 Document Reviewed: 06/21/2017 Elsevier Patient Education  2020 Elsevier Inc.  

## 2019-03-28 NOTE — Progress Notes (Signed)
GYN ENCOUNTER NOTE  Subjective:       Mary Parsons is a 29 y.o. [phone] female is here for gynecologic evaluation of the following issues:  1. Pelvic pain, started five (5) days ago with vaginal bleeding; reports no relief with Tylenol  Denies difficulty breathing or respiratory distress, chest pain, dysuria and leg pain or swelling.   Taking Flagyl as prescribed.    Gynecologic History  Patient's last menstrual period was 03/13/2019 (exact date).  Contraception: tubal ligation   Last Pap: up to date  Obstetric History  OB History  Gravida Para Term Preterm AB Living  3 2 2   1 2   SAB TAB Ectopic Multiple Live Births  1     0 2    # Outcome Date GA Lbr Len/2nd Weight Sex Delivery Anes PTL Lv  3 Term 04/24/18 [redacted]w[redacted]d  7 lb 0.9 oz (3.2 kg) M CS-LTranv Spinal  LIV  2 SAB 11/18/16 [redacted]w[redacted]d         1 Term 08/21/15 [redacted]w[redacted]d  4 lb 2.2 oz (1.878 kg) M CS-Unspec   LIV     Complications: Fetal Intolerance    Past Medical History:  Diagnosis Date  . GERD (gastroesophageal reflux disease)   . Heavy periods     Past Surgical History:  Procedure Laterality Date  . CESAREAN SECTION    . CESAREAN SECTION WITH BILATERAL TUBAL LIGATION Bilateral 04/24/2018   Procedure: REPEAT CESAREAN SECTION WITH BILATERAL TUBAL LIGATION;  Surgeon: Rubie Maid, MD;  Location: [facility];  Service: Obstetrics;  Laterality: Bilateral;  TOB: 12:11 Sex: Female Weight: 7 lb 1 oz  . DILATION AND EVACUATION N/A 11/18/2016   Procedure: DILATATION AND EVACUATION;  Surgeon: Rubie Maid, MD;  Location: [facility];  Service: Gynecology;  Laterality: N/A;  . LAPAROSCOPY N/A 11/18/2016   Procedure: LAPAROSCOPY DIAGNOSTIC;  Surgeon: Rubie Maid, MD;  Location: [facility];  Service: Gynecology;  Laterality: N/A;  . TONSILLECTOMY      Current Outpatient Medications on File Prior to Visit  Medication Sig Dispense Refill  . ALPRAZolam (XANAX) 0.5 MG tablet Take 0.5 mg by mouth as needed.    Marland Kitchen FLUoxetine (PROZAC) 10 MG  capsule Take 1 capsule (10 mg total) by mouth daily. 30 capsule 3  . metroNIDAZOLE (FLAGYL) 500 MG tablet Take 1 tablet (500 mg total) by mouth 2 (two) times daily. 14 tablet 0  . [DISCONTINUED] escitalopram (LEXAPRO) 10 MG tablet Take 2 tablets (20 mg total) by mouth daily. 30 tablet 1   No current facility-administered medications on file prior to visit.    Allergies  Allergen Reactions  . Adhesive [Tape] Other (See Comments)    Burns skin off--tolerates PAPER TAPE ONLY    Social History   Socioeconomic History  . Marital status: Married    Spouse name: Not on file  . Number of children: Not on file  . Years of education: Not on file  . Highest education level: Not on file  Occupational History  . Not on file  Tobacco Use  . Smoking status: Never Smoker  . Smokeless tobacco: Never Used  Substance and Sexual Activity  . Alcohol use: Yes    Comment: occasional   . Drug use: No  . Sexual activity: Yes    Birth control/protection: Surgical    Comment: Tubal ligation  Other Topics Concern  . Not on file  Social History Narrative  . Not on file   Social Determinants of Health   Financial Resource Strain:   .  Difficulty of Paying Living Expenses:   Food Insecurity:   . Worried About Programme researcher, broadcasting/film/video in the Last Year:   . Barista in the Last Year:   Transportation Needs:   . Freight forwarder (Medical):   Marland Kitchen Lack of Transportation (Non-Medical):   Physical Activity:   . Days of Exercise per Week:   . Minutes of Exercise per Session:   Stress:   . Feeling of Stress :   Social Connections:   . Frequency of Communication with Friends and Family:   . Frequency of Social Gatherings with Friends and Family:   . Attends Religious Services:   . Active Member of Clubs or Organizations:   . Attends Banker Meetings:   Marland Kitchen Marital Status:   Intimate Partner Violence:   . Fear of Current or Ex-Partner:   . Emotionally Abused:   Marland Kitchen Physically  Abused:   . Sexually Abused:     Family History  Problem Relation Age of Onset  . Endometriosis Mother   . Endometriosis Maternal Aunt   . Endometriosis Maternal Grandmother   . Breast cancer Neg Hx   . Ovarian cancer Neg Hx   . Colon cancer Neg Hx     The following portions of the patient's history were reviewed and updated as appropriate: allergies, current medications, past family history, past medical history, past social history, past surgical history and problem list.  Review of Systems  ROS negative except as noted above. Information obtained from patient.   Objective:   BP 107/67   Pulse 69   Ht 5\' 2"  (1.575 m)   Wt 172 lb 4.8 oz (78.2 kg)   LMP 03/13/2019 (Exact Date)   Breastfeeding No   BMI 31.51 kg/m    CONSTITUTIONAL: Well-developed, well-nourished female in no acute distress.   ULTRASOUND REPORT  Location: [facility]  Date of Service: 03/28/2019     Indications:Pelvic Pain   Findings:  The uterus is anteverted and measures 8.6 x 3.8 x 5.6. Echo texture is homogenous without evidence of focal masses.  The Endometrium measures 8 mm.  Right Ovary measures 2.7 x 2.0 x 2.1  cm. It is normal in appearance. Left Ovary measures 2.8 x 2.3 x 2.3 cm. It is normal in appearance. Survey of the adnexa demonstrates no adnexal masses. There is no free fluid in the cul de sac.  Impression: 1. Pelvic ultrasound was WNL at this time. 2.Survey of the adnexa demonstrates no adnexal masses.  Recommendations: 1.Clinical correlation with the patient's History and Physical Exam.  Recent Results (from the past 2160 hour(s))  Cervicovaginal ancillary only     Status: Abnormal   Collection Time: 03/22/19  4:53 PM  Result Value Ref Range   Bacterial Vaginitis (gardnerella) Positive (A)    Candida Vaginitis Negative    Candida Glabrata Negative    Comment Normal Reference Range Candida Species - Negative    Comment Normal Reference Range Candida Galbrata -  Negative    Comment      Normal Reference Range Bacterial Vaginosis - Negative        Assessment:   1. Pelvic pain   Plan:   Ultrasound results reviewed with patient, verbalized understanding.   Reviewed red flag symptoms and when to call.   RTC if symptoms worsen or fail to improve.    05/22/19, CNM [facility], [facility] 03/28/19 1:41 PM

## 2019-03-28 NOTE — Progress Notes (Signed)
Patient c/o left sided pelvic pain x5 days.  Taking Tylenol with no relief.

## 2019-04-05 ENCOUNTER — Other Ambulatory Visit (INDEPENDENT_AMBULATORY_CARE_PROVIDER_SITE_OTHER): Payer: BC Managed Care – PPO | Admitting: Certified Nurse Midwife

## 2019-04-05 DIAGNOSIS — R102 Pelvic and perineal pain: Secondary | ICD-10-CM

## 2019-04-05 DIAGNOSIS — B354 Tinea corporis: Secondary | ICD-10-CM

## 2019-04-05 MED ORDER — CLOTRIMAZOLE 1 % EX CREA: 1.0000 "application " | TOPICAL_CREAM | Freq: Two times a day (BID) | CUTANEOUS | 0 refills | Status: DC

## 2019-04-05 MED ORDER — DANAZOL 200 MG PO CAPS: 200.0000 mg | ORAL_CAPSULE | Freq: Two times a day (BID) | ORAL | 1 refills | Status: DC

## 2019-04-05 NOTE — Progress Notes (Signed)
Rx Danazol and Lotrimin, see orders and chart.    Gunnar Bulla, CNM [facility], [facility] 04/05/19 9:31 AM

## 2019-06-18 ENCOUNTER — Other Ambulatory Visit (INDEPENDENT_AMBULATORY_CARE_PROVIDER_SITE_OTHER): Payer: BC Managed Care – PPO | Admitting: Certified Nurse Midwife

## 2019-06-18 ENCOUNTER — Telehealth: Payer: Self-pay | Admitting: Certified Nurse Midwife

## 2019-06-18 DIAGNOSIS — Z8742 Personal history of other diseases of the female genital tract: Secondary | ICD-10-CM

## 2019-06-18 MED ORDER — TRAMADOL HCL 50 MG PO TABS: 50.0000 mg | ORAL_TABLET | Freq: Four times a day (QID) | ORAL | 0 refills | Status: DC | PRN

## 2019-06-18 NOTE — Telephone Encounter (Signed)
Patient called in saying she is having some painful cysts on her ovaries. They come and go she said and they usually resolve on their own however its becoming painful for patient. Patient would like to know if she could have some medication sent in for her? Could you please advise?

## 2019-06-18 NOTE — Telephone Encounter (Signed)
Please call patient and let her know that I have sent some medication to her pharmacy on file. If symptoms continue, then she will need to schedule an appointment. Thanks, JML

## 2019-06-18 NOTE — Progress Notes (Signed)
Rx: Ultram, see orders.    Gunnar Bulla, CNM [facility], [facility] 06/18/19 2:20 PM

## 2019-06-19 ENCOUNTER — Telehealth: Payer: Self-pay | Admitting: Certified Nurse Midwife

## 2019-06-19 NOTE — Telephone Encounter (Signed)
Patient scheduled.

## 2019-06-19 NOTE — Telephone Encounter (Signed)
Patient would like to come in because the pain medication for her ovaries are not working. Patient states its making no difference. She would like to come in. Where would you like me to place her and would she need to be scheduled for an U/S?

## 2019-06-19 NOTE — Telephone Encounter (Signed)
Please find work in slot on day we have ultrasound. Thanks, JML

## 2019-06-20 ENCOUNTER — Ambulatory Visit (INDEPENDENT_AMBULATORY_CARE_PROVIDER_SITE_OTHER): Payer: BC Managed Care – PPO

## 2019-06-20 ENCOUNTER — Other Ambulatory Visit: Payer: Self-pay

## 2019-06-20 ENCOUNTER — Encounter: Payer: Self-pay | Admitting: Certified Nurse Midwife

## 2019-06-20 ENCOUNTER — Other Ambulatory Visit: Payer: Self-pay | Admitting: Certified Nurse Midwife

## 2019-06-20 ENCOUNTER — Ambulatory Visit (INDEPENDENT_AMBULATORY_CARE_PROVIDER_SITE_OTHER): Payer: BC Managed Care – PPO | Admitting: Certified Nurse Midwife

## 2019-06-20 VITALS — BP 101/71 | HR 81 | Ht 62.0 in | Wt 176.1 lb

## 2019-06-20 DIAGNOSIS — N83201 Unspecified ovarian cyst, right side: Secondary | ICD-10-CM | POA: Diagnosis not present

## 2019-06-20 DIAGNOSIS — Z8742 Personal history of other diseases of the female genital tract: Secondary | ICD-10-CM

## 2019-06-20 DIAGNOSIS — R52 Pain, unspecified: Secondary | ICD-10-CM | POA: Diagnosis not present

## 2019-06-20 NOTE — Progress Notes (Signed)
GYN ENCOUNTER NOTE  Subjective:       Mary Parsons is a 29 y.o. [phone] female is here for gynecologic evaluation of the following issues:  1. Ovarian Cyst  Reports pain due to suspected ovarian cyst.  Started hurting last Friday night. Pain was severe on Saturday. No relief with home treatment measures and Ultram is limited due to sleepiness after taking medication.   Never tried Danazol after last discussion with CNM.   Cancelled her scheduled hysterectomy in January, is not ready.    Denies difficulty breathing or respiratory distress, chest pain, abdominal pain, excessive vaginal bleeding, dysuria, leg pain or swelling.  Gynecologic History  Patient's last menstrual period was 06/04/2019 (exact date).  Contraception: tubal ligation   Last Pap: 06/07/18. Results were: Negative  Obstetric History  OB History  Gravida Para Term Preterm AB Living  3 2 2   1 2   SAB TAB Ectopic Multiple Live Births  1     0 2    # Outcome Date GA Lbr Len/2nd Weight Sex Delivery Anes PTL Lv  3 Term 04/24/18 [redacted]w[redacted]d  7 lb 0.9 oz (3.2 kg) M CS-LTranv Spinal  LIV  2 SAB 11/18/16 [redacted]w[redacted]d         1 Term 08/21/15 [redacted]w[redacted]d  4 lb 2.2 oz (1.878 kg) M CS-Unspec   LIV     Complications: Fetal Intolerance    Past Medical History:  Diagnosis Date  . GERD (gastroesophageal reflux disease)   . Heavy periods     Past Surgical History:  Procedure Laterality Date  . CESAREAN SECTION    . CESAREAN SECTION WITH BILATERAL TUBAL LIGATION Bilateral 04/24/2018   Procedure: REPEAT CESAREAN SECTION WITH BILATERAL TUBAL LIGATION;  Surgeon: 06/24/2018, MD;  Location: [facility];  Service: Obstetrics;  Laterality: Bilateral;  TOB: 12:11 Sex: Female Weight: 7 lb 1 oz  . DILATION AND EVACUATION N/A 11/18/2016   Procedure: DILATATION AND EVACUATION;  Surgeon: 13/02/2016, MD;  Location: [facility];  Service: Gynecology;  Laterality: N/A;  . LAPAROSCOPY N/A 11/18/2016   Procedure: LAPAROSCOPY DIAGNOSTIC;  Surgeon: 13/02/2016, MD;  Location: [facility];  Service: Gynecology;  Laterality: N/A;  . TONSILLECTOMY      Current Outpatient Medications on File Prior to Visit  Medication Sig Dispense Refill  . ALPRAZolam (XANAX) 0.5 MG tablet Take 0.5 mg by mouth as needed.    . danazol (DANOCRINE) 200 MG capsule Take 1 capsule (200 mg total) by mouth 2 (two) times daily. 60 capsule 1  . FLUoxetine (PROZAC) 10 MG capsule Take 1 capsule (10 mg total) by mouth daily. 30 capsule 3  . traMADol (ULTRAM) 50 MG tablet Take 1 tablet (50 mg total) by mouth every 6 (six) hours as needed for up to 5 days for moderate pain or severe pain. 20 tablet 0  . clotrimazole (LOTRIMIN AF) 1 % cream Apply 1 application topically 2 (two) times daily. (Patient not taking: Reported on 06/20/2019) 30 g 0  . metroNIDAZOLE (FLAGYL) 500 MG tablet Take 1 tablet (500 mg total) by mouth 2 (two) times daily. (Patient not taking: Reported on 06/20/2019) 14 tablet 0  . [DISCONTINUED] escitalopram (LEXAPRO) 10 MG tablet Take 2 tablets (20 mg total) by mouth daily. 30 tablet 1   No current facility-administered medications on file prior to visit.    Allergies  Allergen Reactions  . Adhesive [Tape] Other (See Comments)    Burns skin off--tolerates PAPER TAPE ONLY    Social History  Socioeconomic History  . Marital status: Married    Spouse name: Not on file  . Number of children: Not on file  . Years of education: Not on file  . Highest education level: Not on file  Occupational History  . Not on file  Tobacco Use  . Smoking status: Never Smoker  . Smokeless tobacco: Never Used  Substance and Sexual Activity  . Alcohol use: Yes    Comment: occasional   . Drug use: No  . Sexual activity: Yes    Birth control/protection: Surgical    Comment: Tubal ligation  Other Topics Concern  . Not on file  Social History Narrative  . Not on file   Social Determinants of Health   Financial Resource Strain:   . Difficulty of Paying Living Expenses:    Food Insecurity:   . Worried About Programme researcher, broadcasting/film/video in the Last Year:   . Barista in the Last Year:   Transportation Needs:   . Freight forwarder (Medical):   Marland Kitchen Lack of Transportation (Non-Medical):   Physical Activity:   . Days of Exercise per Week:   . Minutes of Exercise per Session:   Stress:   . Feeling of Stress :   Social Connections:   . Frequency of Communication with Friends and Family:   . Frequency of Social Gatherings with Friends and Family:   . Attends Religious Services:   . Active Member of Clubs or Organizations:   . Attends Banker Meetings:   Marland Kitchen Marital Status:   Intimate Partner Violence:   . Fear of Current or Ex-Partner:   . Emotionally Abused:   Marland Kitchen Physically Abused:   . Sexually Abused:     Family History  Problem Relation Age of Onset  . Endometriosis Mother   . Endometriosis Maternal Aunt   . Endometriosis Maternal Grandmother   . Breast cancer Neg Hx   . Ovarian cancer Neg Hx   . Colon cancer Neg Hx     The following portions of the patient's history were reviewed and updated as appropriate: allergies, current medications, past family history, past medical history, past social history, past surgical history and problem list.  Review of Systems  ROS- negative except as noted above. Information obtained from pregnancy.  Objective:   BP 101/71   Pulse 81   Ht 5\' 2"  (1.575 m)   Wt 176 lb 1 oz (79.9 kg)   LMP 06/04/2019 (Exact Date)   BMI 32.20 kg/m    CONSTITUTIONAL: Well-developed, well-nourished female in no acute distress.   PHYSICAL EXAM : Not Indicated  ULTRASOUND REPORT  Location: [facility], [facility] Date of Service: 06/20/2019     Indications:Pelvic Pain Findings:  The uterus is retroverted and measures 78 x 38 x 49 mm. Echo texture is homogenous without evidence of focal masses.  The Endometrium measures 12.6 mm.  Right Ovary measures 3.9 x 2.4 x 2.4 cm. It is normal in appearance.  There is a corpus luteal cyst in the right ovary.  Left Ovary measures 2.7 x 1.3 x 2.2 cm. It is normal in appearance. Survey of the adnexa demonstrates no adnexal masses. There is scant free fluid in the cul de sac.  Impression: 1. Normal pelvic ultrasound.   Recommendations: 1.Clinical correlation with the patient's History and Physical Exam.  GAD 7 : Generalized Anxiety Score 06/20/2019 03/22/2019 10/11/2018 08/07/2018  Nervous, Anxious, on Edge 1 2 1 1   Control/stop worrying 0 1 3  1  Worry too much - different things 0 1 3 0  Trouble relaxing 1 2 3 3   Restless 2 2 3 2   Easily annoyed or irritable 1 3 3 3   Afraid - awful might happen 0 0 0 0  Total GAD 7 Score 5 11 16 10   Anxiety Difficulty Somewhat difficult Somewhat difficult Somewhat difficult Somewhat difficult    Assessment:   1. Cyst of right ovary   2. History of ovarian cyst  Plan:   Ultrasound reviewed in office today. All questions answered.  Lo-Loestrin samples provided.  Take Ibuprofen 600 mg every 6 hours as needed for pain during the day time. Encouraged use of heating pad.  Discussed picking up Danazol for pain control and heavy periods.  Provided with handout for Lupron.   Reviewed red flag symptoms and when to call the office.  RTC x 3 months for follow up or sooner if needed.   Fransico Him RN [place] 06/20/19 5:13 PM

## 2019-06-20 NOTE — Patient Instructions (Addendum)
Ovarian Cyst An ovarian cyst is a fluid-filled sac on an ovary. The ovaries are organs that make eggs in women. Most ovarian cysts go away on their own and are not cancerous (are benign). Some cysts need treatment. Follow these instructions at home:  Take over-the-counter and prescription medicines only as told by your doctor.  Do not drive or use heavy machinery while taking prescription pain medicine.  Get pelvic exams and Pap tests as often as told by your doctor.  Return to your normal activities as told by your doctor. Ask your doctor what activities are safe for you.  Do not use any products that contain nicotine or tobacco, such as cigarettes and e-cigarettes. If you need help quitting, ask your doctor.  Keep all follow-up visits as told by your doctor. This is important. Contact a doctor if:  Your periods are: ? Late. ? Irregular. ? Painful.   Your periods stop.  You have pelvic pain that does not go away.  You have pressure on your bladder.  You have trouble making your bladder empty when you pee (urinate).  You have pain during sex.  You have any of the following in your belly (abdomen): ? A feeling of fullness. ? Pressure. ? Discomfort. ? Pain that does not go away. ? Swelling.  You feel sick most of the time.  You have trouble pooping (have constipation).  You are not as hungry as usual (you lose your appetite).  You get very bad acne.  You start to have more hair on your body and face.  You are gaining weight or losing weight without changing your exercise and eating habits.  You think you may be pregnant. Get help right away if:  You have belly pain that is very bad or gets worse.  You cannot eat or drink without throwing up (vomiting).  You suddenly get a fever.  Your period is a lot heavier than usual. This information is not intended to replace advice given to you by your health care provider. Make sure you discuss any questions you have  with your health care provider. Document Revised: 12/16/2016 Document Reviewed: 06/07/2015 Elsevier Patient Education  2020 [employer].  Leuprolide injection What is this medicine? LEUPROLIDE (loo PROE lide) is a man-made hormone. It is used to treat the symptoms of prostate cancer. This medicine may also be used to treat children with early onset of puberty. It may be used for other hormonal conditions. This medicine may be used for other purposes; ask your health care provider or pharmacist if you have questions. COMMON BRAND NAME(S): Lupron What should I tell my health care provider before I take this medicine? They need to know if you have any of these conditions:  diabetes  heart disease or previous heart attack  high blood pressure  high cholesterol  pain or difficulty passing urine  spinal cord metastasis  stroke  tobacco smoker  an unusual or allergic reaction to leuprolide, benzyl alcohol, other medicines, foods, dyes, or preservatives  pregnant or trying to get pregnant  breast-feeding How should I use this medicine? This medicine is for injection under the skin or into a muscle. You will be taught how to prepare and give this medicine. Use exactly as directed. Take your medicine at regular intervals. Do not take your medicine more often than directed. It is important that you put your used needles and syringes in a special sharps container. Do not put them in a trash can. If you do  not have a sharps container, call your pharmacist or healthcare provider to get one. A special MedGuide will be given to you by the pharmacist with each prescription and refill. Be sure to read this information carefully each time. Talk to your pediatrician regarding the use of this medicine in children. While this medicine may be prescribed for children as young as 8 years for selected conditions, precautions do apply. Overdosage: If you think you have taken too much of this medicine  contact a poison control center or emergency room at once. NOTE: This medicine is only for you. Do not share this medicine with others. What if I miss a dose? If you miss a dose, take it as soon as you can. If it is almost time for your next dose, take only that dose. Do not take double or extra doses. What may interact with this medicine? Do not take this medicine with any of the following medications:  chasteberry This medicine may also interact with the following medications:  herbal or dietary supplements, like black cohosh or DHEA  female hormones, like estrogens or progestins and birth control pills, patches, rings, or injections  female hormones, like testosterone This list may not describe all possible interactions. Give your health care provider a list of all the medicines, herbs, non-prescription drugs, or dietary supplements you use. Also tell them if you smoke, drink alcohol, or use illegal drugs. Some items may interact with your medicine. What should I watch for while using this medicine? Visit your doctor or health care professional for regular checks on your progress. During the first week, your symptoms may get worse, but then will improve as you continue your treatment. You may get hot flashes, increased bone pain, increased difficulty passing urine, or an aggravation of nerve symptoms. Discuss these effects with your doctor or health care professional, some of them may improve with continued use of this medicine. Female patients may experience a menstrual cycle or spotting during the first 2 months of therapy with this medicine. If this continues, contact your doctor or health care professional. This medicine may increase blood sugar. Ask your healthcare provider if changes in diet or medicines are needed if you have diabetes. What side effects may I notice from receiving this medicine? Side effects that you should report to your doctor or health care professional as soon as  possible:  allergic reactions like skin rash, itching or hives, swelling of the face, lips, or tongue  breathing problems  chest pain  depression or memory disorders  pain in your legs or groin  pain at site where injected  severe headache  signs and symptoms of high blood sugar such as being more thirsty or hungry or having to urinate more than normal. You may also feel very tired or have blurry vision  swelling of the feet and legs  visual changes  vomiting Side effects that usually do not require medical attention (report to your doctor or health care professional if they continue or are bothersome):  breast swelling or tenderness  decrease in sex drive or performance  diarrhea  hot flashes  loss of appetite  muscle, joint, or bone pains  nausea  redness or irritation at site where injected  skin problems or acne This list may not describe all possible side effects. Call your doctor for medical advice about side effects. You may report side effects to FDA at 1-800-FDA-1088. Where should I keep my medicine? Keep out of the reach of children. Store  below 25 degrees C (77 degrees F). Do not freeze. Protect from light. Do not use if it is not clear or if there are particles present. Throw away any unused medicine after the expiration date. NOTE: This sheet is a summary. It may not cover all possible information. If you have questions about this medicine, talk to your doctor, pharmacist, or health care provider.  2020 Elsevier/Gold Standard (2017-11-02 09:52:48)   Danazol capsules What is this medicine? DANAZOL (DA na zole) is used in women to treat endometriosis and the symptoms of fibrocystic breast disease. This medicine may also be used in men and women to prevent serious allergic reactions known as angioedema. This medicine may be used for other purposes; ask your health care provider or pharmacist if you have questions. COMMON BRAND NAME(S): Danocrine What  should I tell my health care provider before I take this medicine? They need to know if you have any of these conditions:  breast cancer  heart disease  kidney disease  liver disease  porphyria  unusual vaginal bleeding  an unusual or allergic reaction to danazol, other medicines, foods, dyes, or preservatives  pregnant or trying to get pregnant  breast-feeding How should I use this medicine? Take this medicine by mouth with a glass of water. Follow the directions on the prescription label. Take this medicine with food to decrease stomach upset. Take your doses at regular intervals. Do not take your medicine more often than directed. Talk to your pediatrician regarding the use of this medicine in children. Special care may be needed. Overdosage: If you think you have taken too much of this medicine contact a poison control center or emergency room at once. NOTE: This medicine is only for you. Do not share this medicine with others. What if I miss a dose? If you miss a dose, take it as soon as you can. If it is almost time for your next dose, take only that dose. Do not take double or extra doses. What may interact with this medicine? Do not take this medicine with any of the following medications:  cisapride  pimozide  ranolazine This medicine may also interact with the following medications:  carbamazepine  medicines that treat or prevent blood clots like warfarin This list may not describe all possible interactions. Give your health care provider a list of all the medicines, herbs, non-prescription drugs, or dietary supplements you use. Also tell them if you smoke, drink alcohol, or use illegal drugs. Some items may interact with your medicine. What should I watch for while using this medicine? Check with your doctor or health care professional if you are a female patient and notice any changes in your voice, decrease in breast size, or if hair starts growing on your  face. This medicine should not be used in pregnancy. You should use a non-hormonal form of birth control while on this medicine. If you become pregnant or think you may be pregnant while you are taking this medicine, you should stop taking this medicine and contact your doctor or health care professional. This medicine may cause risk to a female fetus. This medicine can affect your menstrual cycle and you may stop having menstrual periods. These will return to normal within 2 to 3 months after you stop taking this medicine. What side effects may I notice from receiving this medicine? Side effects that you should report to your doctor or health care professional as soon as possible:  allergic reactions like skin rash, itching or  hives, swelling of the face, lips, or tongue  changes in vision  dark urine  decrease in breast size  hair loss or unusual hair growth  headache  irregular vaginal bleeding, spotting  nausea, vomiting, stomach pain  redness, blistering, peeling or loosening of the skin, including inside the mouth  unusual bleeding or bruising  unusual swelling of feet or ankles  unusually weak or tired  voice changes  weight gain  yellowing of the skin or eyes Side effects that usually do not require medical attention (report to your doctor or health care professional if they continue or are bothersome):  acne, oily skin  hot flashes, sweating  mood changes  vaginal dryness or irritation This list may not describe all possible side effects. Call your doctor for medical advice about side effects. You may report side effects to FDA at 1-800-FDA-1088. Where should I keep my medicine? Keep out of the reach of children. Store at room temperature between 15 and 30 degrees C (59 and 86 degrees F). Throw away any unused medicine after the expiration date. NOTE: This sheet is a summary. It may not cover all possible information. If you have questions about this medicine,  talk to your doctor, pharmacist, or health care provider.  2020 Elsevier/Gold Standard (2007-04-26 16:38:33)

## 2019-06-20 NOTE — Progress Notes (Signed)
I have seen, interviewed, and examined the patient in conjunction with the [employer] Nurse Practitioner student and affirm the diagnosis and management plan.   Gunnar Bulla, CNM [facility], [facility] 06/21/19 10:57 AM

## 2019-06-20 NOTE — Telephone Encounter (Signed)
Do we have any ultrasound availability the rest of the week? Possible cyst. Thanks, JML

## 2019-06-27 ENCOUNTER — Encounter: Payer: BC Managed Care – PPO | Admitting: Certified Nurse Midwife

## 2019-06-27 ENCOUNTER — Other Ambulatory Visit: Payer: BC Managed Care – PPO

## 2019-07-15 ENCOUNTER — Other Ambulatory Visit (INDEPENDENT_AMBULATORY_CARE_PROVIDER_SITE_OTHER): Payer: BC Managed Care – PPO | Admitting: Certified Nurse Midwife

## 2019-07-15 ENCOUNTER — Other Ambulatory Visit: Payer: Self-pay | Admitting: Certified Nurse Midwife

## 2019-07-15 DIAGNOSIS — R635 Abnormal weight gain: Secondary | ICD-10-CM

## 2019-07-15 DIAGNOSIS — R5383 Other fatigue: Secondary | ICD-10-CM

## 2019-07-15 DIAGNOSIS — Z8742 Personal history of other diseases of the female genital tract: Secondary | ICD-10-CM

## 2019-07-15 DIAGNOSIS — Z8349 Family history of other endocrine, nutritional and metabolic diseases: Secondary | ICD-10-CM

## 2019-07-15 NOTE — Progress Notes (Signed)
PCOS lab orders placed per patient request.   Mary Parsons, CNM [facility], [facility] 07/15/19 3:44 PM

## 2019-07-15 NOTE — Telephone Encounter (Signed)
Please call patient to schedule appointment for ultrasound and labs this week. Thanks, JML

## 2019-07-15 NOTE — Progress Notes (Signed)
Order for thyroid panel due to family history and weight gain.   Serafina Royals, CNM [facility], [facility] 07/15/19 4:24 PM

## 2019-07-16 ENCOUNTER — Telehealth: Payer: Self-pay | Admitting: Certified Nurse Midwife

## 2019-07-16 NOTE — Telephone Encounter (Signed)
Patient called in that she was supposed to speak with a  nurse about getting scheduled for a lab and ultrasound appointment. Informed patient I would contact provider to see what ultrasound she would like patient to be scheduled for and I would give her a call back. I see where there are labs ordered but I don't see anything for an ultrasound. Could you please advise?

## 2019-07-18 ENCOUNTER — Ambulatory Visit (INDEPENDENT_AMBULATORY_CARE_PROVIDER_SITE_OTHER): Payer: BC Managed Care – PPO | Admitting: Certified Nurse Midwife

## 2019-07-18 ENCOUNTER — Other Ambulatory Visit: Payer: Self-pay | Admitting: Certified Nurse Midwife

## 2019-07-18 ENCOUNTER — Ambulatory Visit (INDEPENDENT_AMBULATORY_CARE_PROVIDER_SITE_OTHER): Payer: BC Managed Care – PPO

## 2019-07-18 ENCOUNTER — Other Ambulatory Visit: Payer: Self-pay

## 2019-07-18 ENCOUNTER — Other Ambulatory Visit: Payer: BC Managed Care – PPO

## 2019-07-18 ENCOUNTER — Encounter: Payer: Self-pay | Admitting: Certified Nurse Midwife

## 2019-07-18 VITALS — BP 114/75 | HR 74 | Ht 62.0 in | Wt 175.4 lb

## 2019-07-18 DIAGNOSIS — R102 Pelvic and perineal pain: Secondary | ICD-10-CM | POA: Diagnosis not present

## 2019-07-18 DIAGNOSIS — R635 Abnormal weight gain: Secondary | ICD-10-CM

## 2019-07-18 DIAGNOSIS — E282 Polycystic ovarian syndrome: Secondary | ICD-10-CM

## 2019-07-18 DIAGNOSIS — N941 Unspecified dyspareunia: Secondary | ICD-10-CM

## 2019-07-18 DIAGNOSIS — Z8742 Personal history of other diseases of the female genital tract: Secondary | ICD-10-CM | POA: Diagnosis not present

## 2019-07-18 DIAGNOSIS — R5383 Other fatigue: Secondary | ICD-10-CM | POA: Diagnosis not present

## 2019-07-18 DIAGNOSIS — N940 Mittelschmerz: Secondary | ICD-10-CM

## 2019-07-18 NOTE — Telephone Encounter (Signed)
Patient has been called and scheduled for her lab appointment and her ultrasound appointment. Patient scheduled for today at 2:45 pm.

## 2019-07-18 NOTE — Progress Notes (Signed)
GYN ENCOUNTER NOTE  Subjective:       Mary Parsons is a 29 y.o. [phone] female is here for gynecologic evaluation of the following issues:  1. Possible ovarian cyst 2. Request labs to rule out PCOS  Reports left sided pelvic pain since Monday. No relief with home treatment measures.   History significant for ovarian cyst. Family history of thyroid issue.   Denies difficulty breathing or respiratory distress, chest pain, dysuria, and leg pain or swelling.   Gynecologic History  Patient's last menstrual period was 07/01/2019 (exact date).  Contraception: tubal ligation  Last Pap: Up to date  Obstetric History OB History  Gravida Para Term Preterm AB Living  3 2 2   1 2   SAB TAB Ectopic Multiple Live Births  1     0 2    # Outcome Date GA Lbr Len/2nd Weight Sex Delivery Anes PTL Lv  3 Term 04/24/18 [redacted]w[redacted]d  7 lb 0.9 oz (3.2 kg) M CS-LTranv Spinal  LIV  2 SAB 11/18/16 [redacted]w[redacted]d         1 Term 08/21/15 [redacted]w[redacted]d  4 lb 2.2 oz (1.878 kg) M CS-Unspec   LIV     Complications: Fetal Intolerance    Past Medical History:  Diagnosis Date  . GERD (gastroesophageal reflux disease)   . Heavy periods     Past Surgical History:  Procedure Laterality Date  . CESAREAN SECTION    . CESAREAN SECTION WITH BILATERAL TUBAL LIGATION Bilateral 04/24/2018   Procedure: REPEAT CESAREAN SECTION WITH BILATERAL TUBAL LIGATION;  Surgeon: 06/24/2018, MD;  Location: [facility] ORS;  Service: Obstetrics;  Laterality: Bilateral;  TOB: 12:11 Sex: Female Weight: 7 lb 1 oz  . DILATION AND EVACUATION N/A 11/18/2016   Procedure: DILATATION AND EVACUATION;  Surgeon: 13/02/2016, MD;  Location: [facility] ORS;  Service: Gynecology;  Laterality: N/A;  . LAPAROSCOPY N/A 11/18/2016   Procedure: LAPAROSCOPY DIAGNOSTIC;  Surgeon: 13/02/2016, MD;  Location: [facility] ORS;  Service: Gynecology;  Laterality: N/A;  . TONSILLECTOMY      Current Outpatient Medications on File Prior to Visit  Medication Sig Dispense Refill  . ALPRAZolam  (XANAX) 0.5 MG tablet Take 0.5 mg by mouth as needed.    Hildred Laser FLUoxetine (PROZAC) 10 MG capsule Take 1 capsule (10 mg total) by mouth daily. 30 capsule 3  . danazol (DANOCRINE) 200 MG capsule Take 1 capsule (200 mg total) by mouth 2 (two) times daily. (Patient not taking: Reported on 07/18/2019) 60 capsule 1  . metroNIDAZOLE (FLAGYL) 500 MG tablet Take 1 tablet (500 mg total) by mouth 2 (two) times daily. (Patient not taking: Reported on 06/20/2019) 14 tablet 0  . [DISCONTINUED] escitalopram (LEXAPRO) 10 MG tablet Take 2 tablets (20 mg total) by mouth daily. 30 tablet 1   No current facility-administered medications on file prior to visit.    Allergies  Allergen Reactions  . Adhesive [Tape] Other (See Comments)    Burns skin off--tolerates PAPER TAPE ONLY    Social History   Socioeconomic History  . Marital status: Married    Spouse name: Not on file  . Number of children: Not on file  . Years of education: Not on file  . Highest education level: Not on file  Occupational History  . Not on file  Tobacco Use  . Smoking status: Never Smoker  . Smokeless tobacco: Never Used  Vaping Use  . Vaping Use: Never used  Substance and Sexual Activity  . Alcohol use: Yes  Comment: occasional   . Drug use: No  . Sexual activity: Yes    Birth control/protection: Surgical    Comment: Tubal ligation  Other Topics Concern  . Not on file  Social History Narrative  . Not on file   Social Determinants of Health   Financial Resource Strain:   . Difficulty of Paying Living Expenses:   Food Insecurity:   . Worried About Programme researcher, broadcasting/film/videounning Out of Food in the Last Year:   . Baristaan Out of Food in the Last Year:   Transportation Needs:   . Freight forwarderLack of Transportation (Medical):   Marland Kitchen. Lack of Transportation (Non-Medical):   Physical Activity:   . Days of Exercise per Week:   . Minutes of Exercise per Session:   Stress:   . Feeling of Stress :   Social Connections:   . Frequency of Communication with Friends and  Family:   . Frequency of Social Gatherings with Friends and Family:   . Attends Religious Services:   . Active Member of Clubs or Organizations:   . Attends BankerClub or Organization Meetings:   Marland Kitchen. Marital Status:   Intimate Partner Violence:   . Fear of Current or Ex-Partner:   . Emotionally Abused:   Marland Kitchen. Physically Abused:   . Sexually Abused:     Family History  Problem Relation Age of Onset  . Endometriosis Mother   . Endometriosis Maternal Aunt   . Endometriosis Maternal Grandmother   . Breast cancer Neg Hx   . Ovarian cancer Neg Hx   . Colon cancer Neg Hx     The following portions of the patient's history were reviewed and updated as appropriate: allergies, current medications, past family history, past medical history, past social history, past surgical history and problem list.  Review of Systems  ROS negative except as noted above. Information obtained from patient.   Objective:   BP 114/75   Pulse 74   Ht 5\' 2"  (1.575 m)   Wt 175 lb 6 oz (79.5 kg)   LMP 07/01/2019 (Exact Date)   BMI 32.08 kg/m   CONSTITUTIONAL: Well-developed, well-nourished female in no acute distress.   ULTRASOUND REPORT  Location: [facility]  Date of Service: 07/18/2019   Indications:Pelvic Pain Findings:  The uterus is anteverted and measures 8.3 x 3.6 x 5.1 cm.. Echo texture is homogenous without evidence of focal masses.  The Endometrium measures 8 mm.  Right Ovary measures 3.1 x 2.2 x 2.0 cm. It is normal in appearance. Left Ovary measures 3.3 x 2.8 x 2.7 cm. It is normal in appearance.Hypoechoic lesion with smooth walls and good through transmission                                                                                                                         measuring 1.8 x 1.4 x 1.6 cm Survey of the adnexa demonstrates no adnexal masses. There is no free fluid in the cul de sac.  Impression: 1. Lt dominate  follicle.  Recommendations: 1.Clinical correlation with  the patient's History and Physical Exam.  Recent Results (from the past 2160 hour(s))  DHEA-sulfate     Status: None   Collection Time: 07/18/19  3:02 PM  Result Value Ref Range   DHEA-SO4 243.0 84.8 - 378.0 ug/dL  Prolactin     Status: None   Collection Time: 07/18/19  3:02 PM  Result Value Ref Range   Prolactin 11.7 4.8 - 23.3 ng/mL  Testosterone, Free, Total, SHBG     Status: None   Collection Time: 07/18/19  3:02 PM  Result Value Ref Range   Testosterone 22 13 - 71 ng/dL   Testosterone, Free 1.6 0.0 - 4.2 pg/mL   Sex Hormone Binding 45.4 24.6 - 122.0 nmol/L  FSH/LH     Status: None   Collection Time: 07/18/19  3:02 PM  Result Value Ref Range   LH 11.7 mIU/mL    Comment:                     Adult Female:                       Follicular phase      2.4 -  12.6                       Ovulation phase      14.0 -  95.6                       Luteal phase          1.0 -  11.4                       Postmenopausal        7.7 -  58.5    FSH 5.7 mIU/mL    Comment:                     Adult Female:                       Follicular phase      3.5 -  12.5                       Ovulation phase       4.7 -  21.5                       Luteal phase          1.7 -   7.7                       Postmenopausal       25.8 - 134.8   Estradiol     Status: None   Collection Time: 07/18/19  3:02 PM  Result Value Ref Range   Estradiol 79.5 pg/mL    Comment:                     Adult Female:                       Follicular phase   12.5 -   166.0                       Ovulation phase    85.8 -  498.0                       Luteal phase       43.8 -   211.0                       Postmenopausal     <6.0 -    54.7                     Pregnancy                       1st trimester     215.0 - >4300.0 Roche ECLIA methodology   Progesterone     Status: None   Collection Time: 07/18/19  3:02 PM  Result Value Ref Range   Progesterone 10.7 ng/mL    Comment:                      Follicular phase        0.1 -   0.9                      Luteal phase           1.8 -  23.9                      Ovulation phase        0.1 -  12.0                      Pregnant                         First trimester    11.0 -  44.3                         Second trimester   25.4 -  83.3                         Third trimester    58.7 - 214.0                      Postmenopausal         0.0 -   0.1   Thyroid Panel With TSH     Status: None   Collection Time: 07/18/19  3:06 PM  Result Value Ref Range   TSH 1.470 0.450 - 4.500 uIU/mL   T4, Total 8.0 4.5 - 12.0 ug/dL   T3 Uptake Ratio 25 24 - 39 %   Free Thyroxine Index 2.0 1.2 - 4.9     Assessment:   1. Pelvic pain   2. History of ovarian cyst  3. Other fatigue   4. Weight gain   5. Dyspareunia in female   6. [place]    Plan:   Ultrasound findings reviewed with patient, verbalized understanding.   Discussed treatment options including increased danazol to achieve amenorrhea; not currently taking.   Birth control samples provided.   Labs today: see orders.   Patient considering hysterectomy with left oophorectomy.   Reviewed red flag symptoms and when to call.   RTC for follow up with Dr Valentino Saxon or sooner if needed.    Serafina Royals, CNM [facility], [facility]

## 2019-07-18 NOTE — Telephone Encounter (Signed)
Please handle. Needs GYN ultrasound for evaluation of ovarian cyst. Sent this message on Monday in hopes that the patient would already have been scheduled. What was the hold up? JML

## 2019-07-18 NOTE — Patient Instructions (Addendum)
Danazol capsules What is this medicine? DANAZOL (DA na zole) is used in women to treat endometriosis and the symptoms of fibrocystic breast disease. This medicine may also be used in men and women to prevent serious allergic reactions known as angioedema. This medicine may be used for other purposes; ask your health care provider or pharmacist if you have questions. COMMON BRAND NAME(S): Danocrine What should I tell my health care provider before I take this medicine? They need to know if you have any of these conditions:  breast cancer  heart disease  kidney disease  liver disease  porphyria  unusual vaginal bleeding  an unusual or allergic reaction to danazol, other medicines, foods, dyes, or preservatives  pregnant or trying to get pregnant  breast-feeding How should I use this medicine? Take this medicine by mouth with a glass of water. Follow the directions on the prescription label. Take this medicine with food to decrease stomach upset. Take your doses at regular intervals. Do not take your medicine more often than directed. Talk to your pediatrician regarding the use of this medicine in children. Special care may be needed. Overdosage: If you think you have taken too much of this medicine contact a poison control center or emergency room at once. NOTE: This medicine is only for you. Do not share this medicine with others. What if I miss a dose? If you miss a dose, take it as soon as you can. If it is almost time for your next dose, take only that dose. Do not take double or extra doses. What may interact with this medicine? Do not take this medicine with any of the following medications:  cisapride  pimozide  ranolazine This medicine may also interact with the following medications:  carbamazepine  medicines that treat or prevent blood clots like warfarin This list may not describe all possible interactions. Give your health care provider a list of all the  medicines, herbs, non-prescription drugs, or dietary supplements you use. Also tell them if you smoke, drink alcohol, or use illegal drugs. Some items may interact with your medicine. What should I watch for while using this medicine? Check with your doctor or health care professional if you are a female patient and notice any changes in your voice, decrease in breast size, or if hair starts growing on your face. This medicine should not be used in pregnancy. You should use a non-hormonal form of birth control while on this medicine. If you become pregnant or think you may be pregnant while you are taking this medicine, you should stop taking this medicine and contact your doctor or health care professional. This medicine may cause risk to a female fetus. This medicine can affect your menstrual cycle and you may stop having menstrual periods. These will return to normal within 2 to 3 months after you stop taking this medicine. What side effects may I notice from receiving this medicine? Side effects that you should report to your doctor or health care professional as soon as possible:  allergic reactions like skin rash, itching or hives, swelling of the face, lips, or tongue  changes in vision  dark urine  decrease in breast size  hair loss or unusual hair growth  headache  irregular vaginal bleeding, spotting  nausea, vomiting, stomach pain  redness, blistering, peeling or loosening of the skin, including inside the mouth  unusual bleeding or bruising  unusual swelling of feet or ankles  unusually weak or tired  voice changes  weight  gain  yellowing of the skin or eyes Side effects that usually do not require medical attention (report to your doctor or health care professional if they continue or are bothersome):  acne, oily skin  hot flashes, sweating  mood changes  vaginal dryness or irritation This list may not describe all possible side effects. Call your doctor for  medical advice about side effects. You may report side effects to FDA at 1-800-FDA-1088. Where should I keep my medicine? Keep out of the reach of children. Store at room temperature between 15 and 30 degrees C (59 and 86 degrees F). Throw away any unused medicine after the expiration date. NOTE: This sheet is a summary. It may not cover all possible information. If you have questions about this medicine, talk to your doctor, pharmacist, or health care provider.  2020 Elsevier/Gold Standard (2007-04-26 16:38:33)   Drospirenone tablets (contraception) What is this medicine? DROSPIRENONE (dro SPY re nown) is an oral contraceptive (birth control pill). The product contains a female hormone known as a progestin. It is used to prevent pregnancy. This medicine may be used for other purposes; ask your health care provider or pharmacist if you have questions. COMMON BRAND NAME(S): Slynd What should I tell my health care provider before I take this medicine? They need to know if you have any of these conditions:  abnormal vaginal bleeding  adrenal gland disease  blood vessel disease or blood clots  breast, cervical, endometrial, ovarian, liver, or uterine cancer  diabetes  heart disease or recent heart attack  high potassium level  kidney disease  liver disease  mental depression  migraine headaches  stroke  an unusual or allergic reaction to drospirenone, progestins, or other medicines, foods, dyes, or preservatives  pregnant or trying to get pregnant  breast-feeding How should I use this medicine? Take this medicine by mouth. To reduce nausea, this medicine may be taken with food. Follow the directions on the prescription label. Take this medicine at the same time each day and in the order directed on the package. Do not take your medicine more often than directed. A patient package insert for the product will be given with each prescription and refill. Read this sheet  carefully each time. The sheet may change frequently. Talk to your pediatrician regarding the use of this medicine in children. Special care may be needed. This medicine has been used in female children who have started having menstrual periods. Overdosage: If you think you have taken too much of this medicine contact a poison control center or emergency room at once. NOTE: This medicine is only for you. Do not share this medicine with others. What if I miss a dose? If you miss a dose, take it as soon as you can and refer to the patient information sheet you received with your medicine for direction. If you miss more than one pill, this medicine may not be as effective and you may need to use another form of birth control. What may interact with this medicine? Do not take this medicine with any of the following medications:  atazanavir; cobicistat  bosentan  fosamprenavir This medicine may also interact with the following medications:  aprepitant  barbiturates like phenobarbital, primidone  carbamazepine  certain antibiotics like clarithromycin, rifampin, rifabutin, rifapentine  certain antivirals for HIV or hepatitis  certain diuretics like amiloride, spironolactone, triamterene  certain medicines for fungal infections like griseofulvin, ketoconazole, itraconazole, voriconazole  certain medicines for blood pressure, heart disease  cyclosporine  felbamate  heparin  medicines for diabetes  modafinil  NSAIDs, medicines for pain and inflammation, like ibuprofen or naproxen  oxcarbazepine  phenytoin  potassium supplements  rufinamide  St. John's wort  topiramate This list may not describe all possible interactions. Give your health care provider a list of all the medicines, herbs, non-prescription drugs, or dietary supplements you use. Also tell them if you smoke, drink alcohol, or use illegal drugs. Some items may interact with your medicine. What should I watch  for while using this medicine? Visit your doctor or health care professional for regular checks on your progress. You will need a regular breast and pelvic exam and Pap smear while on this medicine. You may need blood work done while you are taking this medicine. If you have any reason to think you are pregnant, stop taking this medicine right away and contact your doctor or health care professional. This medicine does not protect you against HIV infection (AIDS) or any other sexually transmitted diseases. If you are going to have elective surgery, you may need to stop taking this medicine before the surgery. Consult your health care professional for advice. What side effects may I notice from receiving this medicine? Side effects that you should report to your doctor or health care professional as soon as possible:  allergic reactions like skin rash, itching or hives, swelling of the face, lips, or tongue  breast tissue changes or discharge  depressed mood  severe pain, swelling, or tenderness in the abdomen  signs and symptoms of a blood clot such as chest pain; shortness of breath; pain, swelling, or warmth in the leg  signs and symptoms of increased potassium like muscle weakness; chest pain; or fast, irregular heartbeat  signs and symptoms of liver injury like dark yellow or brown urine; general ill feeling or flu-like symptoms; light-colored stools; loss of appetite; nausea; right upper belly pain; unusually weak or tired; yellowing of the eyes or skin  signs and symptoms of a stroke like changes in vision; confusion; trouble speaking or understanding; severe headaches; sudden numbness or weakness of the face, arm or leg; trouble walking; dizziness; loss of balance or coordination  unusual vaginal bleeding  unusually weak or tired Side effects that usually do not require medical attention (report these to your doctor or health care professional if they continue or are  bothersome):  acne  breast tenderness  headache  menstrual cramps  nausea  weight gain This list may not describe all possible side effects. Call your doctor for medical advice about side effects. You may report side effects to FDA at 1-800-FDA-1088. Where should I keep my medicine? Keep out of the reach of children. Store at room temperature between 20 and 25 degrees C (68 and 77 degrees F). Throw away any unused medicine after the expiration date. NOTE: This sheet is a summary. It may not cover all possible information. If you have questions about this medicine, talk to your doctor, pharmacist, or health care provider.  2020 Elsevier/Gold Standard (2017-06-14 15:01:56)   Norethindrone tablets (contraception) What is this medicine? NORETHINDRONE (nor eth IN drone) is an oral contraceptive. The product contains a female hormone known as a progestin. It is used to prevent pregnancy. This medicine may be used for other purposes; ask your health care provider or pharmacist if you have questions. COMMON BRAND NAME(S): Camila, Deblitane 28-Day, Errin, Heather, [place], [place], [place], Nor-QD, Nora-BE, Norlyroc, Ortho [employer], [employer] 28-Day What should I tell my health care provider before I take  this medicine? They need to know if you have any of these conditions:  blood vessel disease or blood clots  breast, cervical, or vaginal cancer  diabetes  heart disease  kidney disease  liver disease  mental depression  migraine  seizures  stroke  vaginal bleeding  an unusual or allergic reaction to norethindrone, other medicines, foods, dyes, or preservatives  pregnant or trying to get pregnant  breast-feeding How should I use this medicine? Take this medicine by mouth with a glass of water. You may take it with or without food. Follow the directions on the prescription label. Take this medicine at the same time each day and in the order directed on the package. Do  not take your medicine more often than directed. Contact your pediatrician regarding the use of this medicine in children. Special care may be needed. This medicine has been used in female children who have started having menstrual periods. A patient package insert for the product will be given with each prescription and refill. Read this sheet carefully each time. The sheet may change frequently. Overdosage: If you think you have taken too much of this medicine contact a poison control center or emergency room at once. NOTE: This medicine is only for you. Do not share this medicine with others. What if I miss a dose? Try not to miss a dose. Every time you miss a dose or take a dose late your chance of pregnancy increases. When 1 pill is missed (even if only 3 hours late), take the missed pill as soon as possible and continue taking a pill each day at the regular time (use a back up method of birth control for the next 48 hours). If more than 1 dose is missed, use an additional birth control method for the rest of your pill pack until menses occurs. Contact your health care professional if more than 1 dose has been missed. What may interact with this medicine? Do not take this medicine with any of the following medications:  amprenavir or fosamprenavir  bosentan This medicine may also interact with the following medications:  antibiotics or medicines for infections, especially rifampin, rifabutin, rifapentine, and griseofulvin, and possibly penicillins or tetracyclines  aprepitant  barbiturate medicines, such as phenobarbital  carbamazepine  felbamate  modafinil  oxcarbazepine  phenytoin  ritonavir or other medicines for HIV infection or AIDS  St. John's wort  topiramate This list may not describe all possible interactions. Give your health care provider a list of all the medicines, herbs, non-prescription drugs, or dietary supplements you use. Also tell them if you smoke, drink  alcohol, or use illegal drugs. Some items may interact with your medicine. What should I watch for while using this medicine? Visit your doctor or health care professional for regular checks on your progress. You will need a regular breast and pelvic exam and Pap smear while on this medicine. Use an additional method of birth control during the first cycle that you take these tablets. If you have any reason to think you are pregnant, stop taking this medicine right away and contact your doctor or health care professional. If you are taking this medicine for hormone related problems, it may take several cycles of use to see improvement in your condition. This medicine does not protect you against HIV infection (AIDS) or any other sexually transmitted diseases. What side effects may I notice from receiving this medicine? Side effects that you should report to your doctor or health care professional as  soon as possible:  breast tenderness or discharge  pain in the abdomen, chest, groin or leg  severe headache  skin rash, itching, or hives  sudden shortness of breath  unusually weak or tired  vision or speech problems  yellowing of skin or eyes Side effects that usually do not require medical attention (report to your doctor or health care professional if they continue or are bothersome):  changes in sexual desire  change in menstrual flow  facial hair growth  fluid retention and swelling  headache  irritability  nausea  weight gain or loss This list may not describe all possible side effects. Call your doctor for medical advice about side effects. You may report side effects to FDA at 1-800-FDA-1088. Where should I keep my medicine? Keep out of the reach of children. Store at room temperature between 15 and 30 degrees C (59 and 86 degrees F). Throw away any unused medicine after the expiration date. NOTE: This sheet is a summary. It may not cover all possible information.  If you have questions about this medicine, talk to your doctor, pharmacist, or health care provider.  2020 Elsevier/Gold Standard (2011-09-23 16:41:35)   Polycystic Ovarian Syndrome  Polycystic ovarian syndrome (PCOS) is a common hormonal disorder among women of reproductive age. In most women with PCOS, many small fluid-filled sacs (cysts) grow on the ovaries, and the cysts are not part of a normal menstrual cycle. PCOS can cause problems with your menstrual periods and make it difficult to get pregnant. It can also cause an increased risk of miscarriage with pregnancy. If it is not treated, PCOS can lead to serious health problems, such as diabetes and heart disease. What are the causes? The cause of PCOS is not known, but it may be the result of a combination of certain factors, such as:  Irregular menstrual cycle.  High levels of certain hormones (androgens).  Problems with the hormone that helps to control blood sugar (insulin resistance).  Certain genes. What increases the risk? This condition is more likely to develop in women who have a family history of PCOS. What are the signs or symptoms? Symptoms of PCOS may include:  Multiple ovarian cysts.  Infrequent periods or no periods.  Periods that are too frequent or too heavy.  Unpredictable periods.  Inability to get pregnant (infertility) because of not ovulating.  Increased growth of hair on the face, chest, stomach, back, thumbs, thighs, or toes.  Acne or oily skin. Acne may develop during adulthood, and it may not respond to treatment.  Pelvic pain.  Weight gain or obesity.  Patches of thickened and dark brown or black skin on the neck, arms, breasts, or thighs (acanthosis nigricans).  Excess hair growth on the face, chest, abdomen, or upper thighs (hirsutism). How is this diagnosed? This condition is diagnosed based on:  Your medical history.  A physical exam, including a pelvic exam. Your health care  provider may look for areas of increased hair growth on your skin.  Tests, such as: ? Ultrasound. This may be used to examine the ovaries and the lining of the uterus (endometrium) for cysts. ? Blood tests. These may be used to check levels of sugar (glucose), female hormone (testosterone), and female hormones (estrogen and progesterone) in your blood. How is this treated? There is no cure for PCOS, but treatment can help to manage symptoms and prevent more health problems from developing. Treatment varies depending on:  Your symptoms.  Whether you want to have a  baby or whether you need birth control (contraception). Treatment may include nutrition and lifestyle changes along with:  Progesterone hormone to start a menstrual period.  Birth control pills to help you have regular menstrual periods.  Medicines to make you ovulate, if you want to get pregnant.  Medicine to reduce excessive hair growth.  Surgery, in severe cases. This may involve making small holes in one or both of your ovaries. This decreases the amount of testosterone that your body produces. Follow these instructions at home:  Take over-the-counter and prescription medicines only as told by your health care provider.  Follow a healthy meal plan. This can help you reduce the effects of PCOS. ? Eat a healthy diet that includes lean proteins, complex carbohydrates, fresh fruits and vegetables, low-fat dairy products, and healthy fats. Make sure to eat enough fiber.  If you are overweight, lose weight as told by your health care provider. ? Losing 10% of your body weight may improve symptoms. ? Your health care provider can determine how much weight loss is best for you and can help you lose weight safely.  Keep all follow-up visits as told by your health care provider. This is important. Contact a health care provider if:  Your symptoms do not get better with medicine.  You develop new symptoms. This information is  not intended to replace advice given to you by your health care provider. Make sure you discuss any questions you have with your health care provider. Document Revised: 12/16/2016 Document Reviewed: 06/21/2015 Elsevier Patient Education  2020 Elsevier Inc.   Diet for Polycystic Ovary Syndrome Polycystic ovary syndrome (PCOS) is a disorder of the chemicals (hormones) that regulate a woman's reproductive system, including monthly periods (menstruation). The condition causes important hormones to be out of balance. PCOS can:  Stop your periods or make them irregular.  Cause cysts to develop on your ovaries.  Make it difficult to get pregnant.  Stop your body from responding to the effects of insulin (insulin resistance). Insulin resistance can lead to obesity and diabetes. Changing what you eat can help you manage PCOS and improve your health. Following a balanced diet can help you lose weight and improve the way that your body uses insulin. What are tips for following this plan?  Follow a balanced diet for meals and snacks. Eat breakfast, lunch, dinner, and one or two snacks every day.  Include protein in each meal and snack.  Choose whole grains instead of products that are made with refined flour.  Eat a variety of foods.  Exercise regularly as told by your health care provider. Aim to do 30 or more minutes of exercise on most days of the week.  If you are overweight or obese: ? Pay attention to how many calories you eat. Cutting down on calories can help you lose weight. ? Work with your health care provider or a diet and nutrition specialist (dietitian) to figure out how many calories you need each day. What foods can I eat?  Fruits Include a variety of colors and types. All fruits are helpful for PCOS. Vegetables Include a variety of colors and types. All vegetables are helpful for PCOS. Grains Whole grains, such as whole wheat. Whole-grain breads, crackers, cereals, and  pasta. Unsweetened oatmeal, bulgur, barley, quinoa, and brown rice. Tortillas made from corn or whole-wheat flour. Meats and other proteins Low-fat (lean) proteins, such as fish, chicken, beans, eggs, and tofu. Dairy Low-fat dairy products, such as skim milk, cheese sticks, and  yogurt. Beverages Low-fat or fat-free drinks, such as water, low-fat milk, sugar-free drinks, and small amounts of 100% fruit juice. Seasonings and condiments Ketchup. Mustard. Barbecue sauce. Relish. Low-fat or fat-free mayonnaise. Fats and oils Olive oil or canola oil. Walnuts and almonds. The items listed above may not be a complete list of recommended foods and beverages. Contact a dietitian for more options. What foods are not recommended? Foods that are high in calories or fat. Fried foods. Sweets. Products that are made from refined white flour, including white bread, pastries, white rice, and pasta. The items listed above may not be a complete list of foods and beverages to avoid. Contact a dietitian for more information. Summary  PCOS is a hormonal imbalance that affects a woman's reproductive system.  You can help to manage your PCOS by exercising regularly and eating a healthy, varied diet of vegetables, fruit, whole grains, low-fat (lean) protein, and low-fat dairy products.  Changing what you eat can improve the way that your body uses insulin, help your hormones reach normal levels, and help you lose weight. This information is not intended to replace advice given to you by your health care provider. Make sure you discuss any questions you have with your health care provider. Document Revised: 04/25/2018 Document Reviewed: 11/07/2016 Elsevier Patient Education  2020 [employer].   Mittelschmerz Mittelschmerz is a condition that affects women. It is pain in the abdomen that occurs between periods. The pain in this condition:  Affects one side of the abdomen. The side that it affects may change from  month to month.  May be mild or severe.  May last from minutes to hours. It does not last longer than 1-2 days.  Can happen with nausea and light vaginal bleeding. Mittelschmerz is caused by the growth and release of an egg from an ovary, and it is a natural part of the ovulation cycle. It often happens about 2 weeks after a woman's period ends. Follow these instructions at home: Pay attention to any changes in your condition. Let your health care provider know about them. Take these actions to help with your pain:  Try soaking in a hot bath.  Try a heating pad to relax the muscles in the abdomen.  Take over-the-counter and prescription medicines only as told by your health care provider.  Keep all follow-up visits as told by your health care provider. This is important. Contact a health care provider if:  You have very bad pain most months.  You have abdominal pain that lasts longer than 24 hours.  Your pain medicine is not helping.  You have a fever.  You have nausea or vomiting that will not go away.  You miss your period.  You have vaginal bleeding between your periods that is heavier than spotting. Summary  Mittelschmerz is a condition that affects women. It is pain in the abdomen that occurs between periods.  Mittelschmerz is caused by the growth and release of an egg from an ovary, and it is a natural part of the ovulation cycle.  The pain in this condition may affect one side of the abdomen, may be mild or severe, or may cause nausea.  To relieve your pain, try soaking in a hot bath, using a heating pad, or taking prescription or over-the-counter medicines.  Contact a health care provider if you have pain most months of the year, your pain lasts more than 24 hours, your pain medicine is not helping, or you have vaginal bleeding that  is heavier than spotting. This information is not intended to replace advice given to you by your health care provider. Make sure you  discuss any questions you have with your health care provider. Document Revised: 07/18/2017 Document Reviewed: 07/18/2017 Elsevier Patient Education  2020 [employer].

## 2019-07-18 NOTE — Telephone Encounter (Signed)
The point was to get her schedule this week. Her cyst will probably rupture by next week. This is why I sent the message on Monday. JML

## 2019-07-19 LAB — THYROID PANEL WITH TSH
Free Thyroxine Index: 2 (ref 1.2–4.9)
T3 Uptake Ratio: 25 % (ref 24–39)
T4, Total: 8 ug/dL (ref 4.5–12.0)
TSH: 1.47 u[IU]/mL (ref 0.450–4.500)

## 2019-07-23 ENCOUNTER — Other Ambulatory Visit: Payer: BC Managed Care – PPO

## 2019-07-23 NOTE — Telephone Encounter (Signed)
Thoughts on hysterectomy with left oophorectomy? JML

## 2019-07-24 LAB — PROGESTERONE: Progesterone: 10.7 ng/mL

## 2019-07-24 LAB — TESTOSTERONE, FREE, TOTAL, SHBG
Sex Hormone Binding: 45.4 nmol/L (ref 24.6–122.0)
Testosterone, Free: 1.6 pg/mL (ref 0.0–4.2)
Testosterone: 22 ng/dL (ref 13–71)

## 2019-07-24 LAB — PROLACTIN: Prolactin: 11.7 ng/mL (ref 4.8–23.3)

## 2019-07-24 LAB — DHEA-SULFATE: DHEA-SO4: 243 ug/dL (ref 84.8–378.0)

## 2019-07-24 LAB — FSH/LH
FSH: 5.7 m[IU]/mL
LH: 11.7 m[IU]/mL

## 2019-07-24 LAB — ESTRADIOL: Estradiol: 79.5 pg/mL

## 2019-08-01 ENCOUNTER — Encounter: Payer: BC Managed Care – PPO | Admitting: Certified Nurse Midwife

## 2019-08-05 NOTE — Progress Notes (Signed)
Pt to discuss surgery. Pt c/o of left ovarian cyst and would like to have a hysterectomy.

## 2019-08-06 ENCOUNTER — Encounter: Payer: Self-pay | Admitting: Obstetrics and Gynecology

## 2019-08-06 ENCOUNTER — Ambulatory Visit: Payer: BC Managed Care – PPO | Admitting: Obstetrics and Gynecology

## 2019-08-06 VITALS — BP 94/64 | HR 66 | Ht 62.0 in | Wt 171.3 lb

## 2019-08-06 DIAGNOSIS — R102 Pelvic and perineal pain: Secondary | ICD-10-CM

## 2019-08-06 DIAGNOSIS — Z8742 Personal history of other diseases of the female genital tract: Secondary | ICD-10-CM | POA: Diagnosis not present

## 2019-08-06 DIAGNOSIS — N926 Irregular menstruation, unspecified: Secondary | ICD-10-CM

## 2019-08-06 DIAGNOSIS — N941 Unspecified dyspareunia: Secondary | ICD-10-CM

## 2019-08-06 NOTE — Progress Notes (Signed)
GYN ENCOUNTER NOTE  Subjective:       Mary Parsons is a 29 y.o. [phone] female is here to discuss surgical options for recurrent left ovarian cyst, chronic pelvic pain, and irregular cycles despite use of contraception. She has been worked up for endometriosis in the past surgically with negative biopsy findings. She also has tried other medical management options including OCPs, Mirena IUD, and [place]. Patient has already been approved for a hysterectomy by her insurance. Was seen at [facility] a few months ago for her symptoms for a second opinion and was offered a hysterectomy. Notes she initially scheduled but got scared and changed her mind. Now notes that she knows this is the right decision for her.   Reports left sided pelvic pain. Patient states the cyst began approx. 4 months ago. She has her period and then when she is ovulating she feels the pain. Patient states its is only on the left. No relief with home treatment measures.    Gynecologic History Patient's last menstrual period was 07/23/2019. Contraception: tubal ligation, 04/2018 Last Pap: 05/2018, normal   Obstetric History OB History  Gravida Para Term Preterm AB Living  3 2 2   1 2   SAB TAB Ectopic Multiple Live Births  1     0 2    # Outcome Date GA Lbr Len/2nd Weight Sex Delivery Anes PTL Lv  3 Term 04/24/18 [redacted]w[redacted]d  3200 g M CS-LTranv Spinal  LIV  2 SAB 11/18/16 [redacted]w[redacted]d         1 Term 08/21/15 [redacted]w[redacted]d  1878 g M CS-Unspec   LIV     Complications: Fetal Intolerance    Past Medical History:  Diagnosis Date  . GERD (gastroesophageal reflux disease)   . Heavy periods     Past Surgical History:  Procedure Laterality Date  . CESAREAN SECTION    . CESAREAN SECTION WITH BILATERAL TUBAL LIGATION Bilateral 04/24/2018   Procedure: REPEAT CESAREAN SECTION WITH BILATERAL TUBAL LIGATION;  Surgeon: 06/24/2018, MD;  Location: [facility] ORS;  Service: Obstetrics;  Laterality: Bilateral;  TOB: 12:11 Sex: Female Weight: 7 lb 1 oz  .  DILATION AND EVACUATION N/A 11/18/2016   Procedure: DILATATION AND EVACUATION;  Surgeon: 13/02/2016, MD;  Location: [facility];  Service: Gynecology;  Laterality: N/A;  . LAPAROSCOPY N/A 11/18/2016   Procedure: LAPAROSCOPY DIAGNOSTIC;  Surgeon: 13/02/2016, MD;  Location: [facility] ORS;  Service: Gynecology;  Laterality: N/A;  . TONSILLECTOMY      Current Outpatient Medications on File Prior to Visit  Medication Sig Dispense Refill  . ALPRAZolam (XANAX) 0.5 MG tablet Take 0.5 mg by mouth as needed.    Hildred Laser FLUoxetine (PROZAC) 10 MG capsule Take 1 capsule (10 mg total) by mouth daily. 30 capsule 3  . metroNIDAZOLE (FLAGYL) 500 MG tablet Take 1 tablet (500 mg total) by mouth 2 (two) times daily. (Patient not taking: Reported on 06/20/2019) 14 tablet 0  . [DISCONTINUED] escitalopram (LEXAPRO) 10 MG tablet Take 2 tablets (20 mg total) by mouth daily. 30 tablet 1   No current facility-administered medications on file prior to visit.    Allergies  Allergen Reactions  . Adhesive [Tape] Other (See Comments)    Burns skin off--tolerates PAPER TAPE ONLY    Social History   Socioeconomic History  . Marital status: Married    Spouse name: Not on file  . Number of children: Not on file  . Years of education: Not on file  . Highest education level: Not  on file  Occupational History  . Not on file  Tobacco Use  . Smoking status: Never Smoker  . Smokeless tobacco: Never Used  Vaping Use  . Vaping Use: Never used  Substance and Sexual Activity  . Alcohol use: Yes    Comment: occasional   . Drug use: No  . Sexual activity: Yes    Birth control/protection: Surgical    Comment: Tubal ligation  Other Topics Concern  . Not on file  Social History Narrative  . Not on file   Social Determinants of Health   Financial Resource Strain:   . Difficulty of Paying Living Expenses:   Food Insecurity:   . Worried About Programme researcher, broadcasting/film/video in the Last Year:   . Barista in the Last Year:    Transportation Needs:   . Freight forwarder (Medical):   Marland Kitchen Lack of Transportation (Non-Medical):   Physical Activity:   . Days of Exercise per Week:   . Minutes of Exercise per Session:   Stress:   . Feeling of Stress :   Social Connections:   . Frequency of Communication with Friends and Family:   . Frequency of Social Gatherings with Friends and Family:   . Attends Religious Services:   . Active Member of Clubs or Organizations:   . Attends Banker Meetings:   Marland Kitchen Marital Status:   Intimate Partner Violence:   . Fear of Current or Ex-Partner:   . Emotionally Abused:   Marland Kitchen Physically Abused:   . Sexually Abused:     Family History  Problem Relation Age of Onset  . Endometriosis Mother   . Endometriosis Maternal Aunt   . Endometriosis Maternal Grandmother   . Breast cancer Neg Hx   . Ovarian cancer Neg Hx   . Colon cancer Neg Hx     The following portions of the patient's history were reviewed and updated as appropriate: allergies, current medications, past family history, past medical history, past social history, past surgical history and problem list.  Review of Systems  ROS negative except as noted above. Information obtained from patient.   Objective:   BP 94/64   Pulse 66   Ht 5\' 2"  (1.575 m)   Wt 77.7 kg   LMP 07/23/2019   BMI 31.33 kg/m   CONSTITUTIONAL: Well-developed, well-nourished female in no acute distress.  Remainder of exam noted in H&P.   ULTRASOUND REPORT  Location: [facility]  Date of Service: 07/18/2019   Indications:Pelvic Pain Findings:  The uterus is anteverted and measures 8.3 x 3.6 x 5.1 cm.. Echo texture is homogenous without evidence of focal masses.  The Endometrium measures 8 mm.  Right Ovary measures 3.1 x 2.2 x 2.0 cm. It is normal in appearance. Left Ovary measures 3.3 x 2.8 x 2.7 cm. It is normal in appearance.Hypoechoic lesion with smooth walls and good through transmission measuring 1.8 x 1.4 x 1.6  cm.  Survey of the adnexa demonstrates no adnexal masses. There is no free fluid in the cul de sac.  Impression: 1. Lt dominate follicle.  Recommendations: 1.Clinical correlation with the patient's History and Physical Exam.  Recent Results (from the past 2160 hour(s))  DHEA-sulfate     Status: None   Collection Time: 07/18/19  3:02 PM  Result Value Ref Range   DHEA-SO4 243.0 84.8 - 378.0 ug/dL  Prolactin     Status: None   Collection Time: 07/18/19  3:02 PM  Result Value Ref Range  Prolactin 11.7 4.8 - 23.3 ng/mL  Testosterone, Free, Total, SHBG     Status: None   Collection Time: 07/18/19  3:02 PM  Result Value Ref Range   Testosterone 22 13 - 71 ng/dL   Testosterone, Free 1.6 0.0 - 4.2 pg/mL   Sex Hormone Binding 45.4 24.6 - 122.0 nmol/L  FSH/LH     Status: None   Collection Time: 07/18/19  3:02 PM  Result Value Ref Range   LH 11.7 mIU/mL    Comment:                     Adult Female:                       Follicular phase      2.4 -  12.6                       Ovulation phase      14.0 -  95.6                       Luteal phase          1.0 -  11.4                       Postmenopausal        7.7 -  58.5    FSH 5.7 mIU/mL    Comment:                     Adult Female:                       Follicular phase      3.5 -  12.5                       Ovulation phase       4.7 -  21.5                       Luteal phase          1.7 -   7.7                       Postmenopausal       25.8 - 134.8   Estradiol     Status: None   Collection Time: 07/18/19  3:02 PM  Result Value Ref Range   Estradiol 79.5 pg/mL    Comment:                     Adult Female:                       Follicular phase   12.5 -   166.0                       Ovulation phase    85.8 -   498.0                       Luteal phase       43.8 -   211.0                       Postmenopausal     <6.0 -    54.7  Pregnancy                       1st trimester     215.0 - >4300.0 Roche ECLIA  methodology   Progesterone     Status: None   Collection Time: 07/18/19  3:02 PM  Result Value Ref Range   Progesterone 10.7 ng/mL    Comment:                      Follicular phase       0.1 -   0.9                      Luteal phase           1.8 -  23.9                      Ovulation phase        0.1 -  12.0                      Pregnant                         First trimester    11.0 -  44.3                         Second trimester   25.4 -  83.3                         Third trimester    58.7 - 214.0                      Postmenopausal         0.0 -   0.1   Thyroid Panel With TSH     Status: None   Collection Time: 07/18/19  3:06 PM  Result Value Ref Range   TSH 1.470 0.450 - 4.500 uIU/mL   T4, Total 8.0 4.5 - 12.0 ug/dL   T3 Uptake Ratio 25 24 - 39 %   Free Thyroxine Index 2.0 1.2 - 4.9     Assessment:   1. Pelvic pain   2. History of ovarian cyst   3. Dyspareunia in female   4. Irregular menses     Plan:   Patient desires definitive management with hysterectomy.  I proposed doing a laparoscopic-assisted vaginal hysterectomy (LAVH) prophylactic bilateral salpingectomy with left oophorectomy. Patient agrees with this proposed surgery.  The risks of surgery were discussed in detail with the patient including but not limited to: bleeding which may require transfusion or reoperation; infection which may require antibiotics; injury to bowel, bladder, ureters or other surrounding organs; need for additional procedures including laparotomy; formation of adhesions; thromboembolic phenomenon; incisional problems and other postoperative/anesthesia complications.  Patient was also advised that she will remain in house for 1-2 nights; and expected recovery time after a hysterectomy is 6-8 weeks.  Patient was told that the likelihood that her condition and symptoms will be treated effectively with this surgical management was very high; the postoperative expectations were also discussed in  detail. The patient also understands the alternative treatment options which were discussed in full. All questions were answered.  She was told that she will be contacted by our surgical scheduler regarding the time and date of her surgery; routine preoperative  instructions will be given to her by the preoperative nursing team.   She is aware of need for preoperative COVID testing and subsequent quarantine from time of test to time of surgery; she will be given further preoperative instructions at that COVID screening visit.   Routine postoperative instructions will be reviewed with the patient in detail after surgery. Bleeding precautions were reviewed. Printed patient education handouts about the procedure was given to the patient to review at home. Patient to inform of surgery date desired.    .A total of 15 minutes were spent face-to-face with the patient during this encounter and over half of that time dealt with counseling and coordination of care.  I have seen and examined the patient with Dominic Pea, [place] PA-S.  I have reviewed the record and concur with patient management and plan.   Hildred Laser, MD [facility]

## 2019-08-06 NOTE — Patient Instructions (Signed)
Supracervical Hysterectomy A supracervical hysterectomy (also called subtotal hysterectomy) is a surgery to remove the top part of the uterus while leaving the cervix in place. It may be done to treat these problems:  Fibroid tumors.  Endometriosis.  Certain types of uterine cancer. This surgery is usually done using a technique called laparoscopy. The technique is minimally invasive, which means that it is done through small incisions instead of a large incision. The result is less pain, lower risk of infection, and shorter recovery time. The surgery is sometimes done with a procedure to remove the fallopian tubes and ovaries (bilateral salpingo-oophorectomy). After the surgery, menstrual periods will stop and it will no longer be possible to get pregnant. Tell a health care provider about:  Any allergies you have.  All medicines you are taking, including vitamins, herbs, eye drops, creams, and over-the-counter medicines.  Any problems you or family members have had with anesthetic medicines.  Any blood disorders you have.  Any surgeries you have had.  Any medical conditions you have. What are the risks? Generally, this is a safe procedure. However, problems may occur, including:  Bleeding.  Blood clots in the legs or lung.  Infection.  Changing from small incisions to open abdominal surgery.  Additional surgery later to remove the cervix, if you have problems with the cervix.  Allergic reactions to medicines or dyes.  Damage to other structures or organs. What happens before the procedure? Staying hydrated Follow instructions from your health care provider about hydration, which may include:  Up to 2 hours before the procedure - you may continue to drink clear liquids, such as water, clear fruit juice, black coffee, and plain tea. Eating and drinking restrictions Follow instructions from your health care provider about eating and drinking, which may include:  8 hours  before the procedure - stop eating heavy meals or foods such as meat, fried foods, or fatty foods.  6 hours before the procedure - stop eating light meals or foods, such as toast or cereal.  6 hours before the procedure - stop drinking milk or drinks that contain milk.  2 hours before the procedure - stop drinking clear liquids. Medicine Ask your health care provider about:  Changing or stopping your regular medicines. This is especially important if you are taking diabetes medicines or blood thinners.  Taking over-the-counter medicines, vitamins, herbs, and supplements.  Taking medicines such as aspirin and ibuprofen. These medicines can thin your blood. Do not take these medicines unless your health care provider tells you to take them. General instructions  Do not use any products that contain nicotine or tobacco, such as cigarettes and e-cigarettes. If you need help quitting, ask your health care provider.  Ask your health care provider how your surgical site will be marked or identified.  To reduce your risk for infection, you may be asked to shower with a germ-killing soap.  Plan to have someone take you home from the hospital or clinic. What happens during the procedure?  To lower your risk of infection: ? Your health care team will wash or sanitize their hands. ? Hair may be removed from the surgical area. ? Your skin will be washed with soap.  An IV tube will be inserted into one of your veins.  You will be given one or more of the following: ? A medicine to help you relax (sedative). ? A medicine to make you fall asleep (general anesthetic).  You will be given an antibiotic medicine.  A  gas will be used to inflate your abdomen. It will allow your surgeon to look inside your abdomen and perform the surgery.  Three or four small incisions will be made in your abdomen. One of these incisions will be made in the area of your belly button.  A thin, flexible tube with a  light and camera (laparoscope) will be inserted into an incision. The camera on the laparoscope will send pictures to a TV screen in the operating room, making it easy for the surgeon to have a good view of your abdomen.  Surgical instruments will be inserted through the other incisions.  The uterus will be cut into small pieces and removed through the incisions.  Your incisions will be closed with stitches (sutures) or steri-strips The procedure may vary among health care providers and hospitals. What happens after the procedure?  Your blood pressure, heart rate, breathing rate, and blood oxygen level will be monitored until the medicines you were given have worn off.  You may have some discomfort, tenderness, swelling, and bruising at the surgical site.  You will be given pain medicine for use in the hospital and at home after you are discharged.  Do not drive if you were given a sedative during the procedure. Summary  A supracervical hysterectomy is a surgery to remove the top part of the uterus while leaving the cervix in place.  This surgery is usually done using a minimally invasive technique, which means it is done through small incisions instead of a large incision.  The result is less pain, lower risk of infection, and shorter recovery time. This information is not intended to replace advice given to you by your health care provider. Make sure you discuss any questions you have with your health care provider. Document Revised: 03/01/2018 Document Reviewed: 03/31/2016 Elsevier Patient Education  2020 [employer].

## 2019-08-07 NOTE — Telephone Encounter (Signed)
Please advise. Thanks Milaina Sher 

## 2019-08-09 NOTE — H&P (Signed)
@[place] @   GYNECOLOGY PREOPERATIVE HISTORY AND PHYSICAL   Subjective:  Mary Parsons is a 28 y.o. 27 here for surgical management of recurrent left ovarian cyst, dyspareunia, chronic pelvic pain, and irregular cycles despite use of contraception.   No significant preoperative concerns.  Proposed surgery: LAVH with bilateral salpingectomy and left oophorectomy    Pertinent Gynecological History: Menses: flow is moderate and irregular occurring without intermenstrual spotting Contraception: tubal ligation Last pap: normal Date: 06/07/2018.   Past Medical History:  Diagnosis Date  . GERD (gastroesophageal reflux disease)   . Heavy periods     Past Surgical History:  Procedure Laterality Date  . CESAREAN SECTION    . CESAREAN SECTION WITH BILATERAL TUBAL LIGATION Bilateral 04/24/2018   Procedure: REPEAT CESAREAN SECTION WITH BILATERAL TUBAL LIGATION;  Surgeon: 06/24/2018, MD;  Location: [facility];  Service: Obstetrics;  Laterality: Bilateral;  TOB: 12:11 Sex: Female Weight: 7 lb 1 oz  . DILATION AND EVACUATION N/A 11/18/2016   Procedure: DILATATION AND EVACUATION;  Surgeon: 13/02/2016, MD;  Location: [facility];  Service: Gynecology;  Laterality: N/A;  . LAPAROSCOPY N/A 11/18/2016   Procedure: LAPAROSCOPY DIAGNOSTIC;  Surgeon: 13/02/2016, MD;  Location: [facility];  Service: Gynecology;  Laterality: N/A;  . TONSILLECTOMY      OB History  Gravida Para Term Preterm AB Living  3 2 2   1 2   SAB TAB Ectopic Multiple Live Births  1     0 2    # Outcome Date GA Lbr Len/2nd Weight Sex Delivery Anes PTL Lv  3 Term 04/24/18 [redacted]w[redacted]d  7 lb 0.9 oz (3.2 kg) M CS-LTranv Spinal  LIV  2 SAB 11/18/16 [redacted]w[redacted]d         1 Term 08/21/15 [redacted]w[redacted]d  4 lb 2.2 oz (1.878 kg) M CS-Unspec   LIV     Complications: Fetal Intolerance    Family History  Problem Relation Age of Onset  . Endometriosis Mother   . Endometriosis Maternal Aunt   . Endometriosis Maternal Grandmother   . Breast cancer Neg  Hx   . Ovarian cancer Neg Hx   . Colon cancer Neg Hx     Social History   Socioeconomic History  . Marital status: Married    Spouse name: Not on file  . Number of children: Not on file  . Years of education: Not on file  . Highest education level: Not on file  Occupational History  . Not on file  Tobacco Use  . Smoking status: Never Smoker  . Smokeless tobacco: Never Used  Vaping Use  . Vaping Use: Never used  Substance and Sexual Activity  . Alcohol use: Yes    Comment: occasional   . Drug use: No  . Sexual activity: Yes    Birth control/protection: Surgical    Comment: Tubal ligation  Other Topics Concern  . Not on file  Social History Narrative  . Not on file   Social Determinants of Health   Financial Resource Strain:   . Difficulty of Paying Living Expenses:   Food Insecurity:   . Worried About 10/21/15 in the Last Year:   . [redacted]w[redacted]d in the Last Year:   Transportation Needs:   . Programme researcher, broadcasting/film/video (Medical):   Barista Lack of Transportation (Non-Medical):   Physical Activity:   . Days of Exercise per Week:   . Minutes of Exercise per Session:   Stress:   . Feeling of Stress :  Social Connections:   . Frequency of Communication with Friends and Family:   . Frequency of Social Gatherings with Friends and Family:   . Attends Religious Services:   . Active Member of Clubs or Organizations:   . Attends Banker Meetings:   Marland Kitchen Marital Status:   Intimate Partner Violence:   . Fear of Current or Ex-Partner:   . Emotionally Abused:   Marland Kitchen Physically Abused:   . Sexually Abused:     Current Outpatient Medications on File Prior to Visit  Medication Sig Dispense Refill  . ALPRAZolam (XANAX) 0.5 MG tablet Take 0.5 mg by mouth as needed.    Marland Kitchen FLUoxetine (PROZAC) 10 MG capsule Take 1 capsule (10 mg total) by mouth daily. 30 capsule 3  . metroNIDAZOLE (FLAGYL) 500 MG tablet Take 1 tablet (500 mg total) by mouth 2 (two) times daily.  (Patient not taking: Reported on 06/20/2019) 14 tablet 0  . [DISCONTINUED] escitalopram (LEXAPRO) 10 MG tablet Take 2 tablets (20 mg total) by mouth daily. 30 tablet 1   No current facility-administered medications on file prior to visit.   Allergies  Allergen Reactions  . Adhesive [Tape] Other (See Comments)    Burns skin off--tolerates PAPER TAPE ONLY     Review of Systems Constitutional: No recent fever/chills/sweats Respiratory: No recent cough/bronchitis Cardiovascular: No chest pain Gastrointestinal: No recent nausea/vomiting/diarrhea Genitourinary: No UTI symptoms Hematologic/lymphatic:No history of coagulopathy or recent blood thinner use    Objective:   Blood pressure 94/64, pulse 66, height 5\' 2"  (1.575 m), weight 171 lb 4.8 oz (77.7 kg), last menstrual period 07/23/2019, not currently breastfeeding. CONSTITUTIONAL: Well-developed, well-nourished female in no acute distress.  HENT:  Normocephalic, atraumatic, External right and left ear normal. Oropharynx is clear and moist EYES: Conjunctivae and EOM are normal. Pupils are equal, round, and reactive to light. No scleral icterus.  NECK: Normal range of motion, supple, no masses SKIN: Skin is warm and dry. No rash noted. Not diaphoretic. No erythema. No pallor. NEUROLOGIC: Alert and oriented to person, place, and time. Normal reflexes, muscle tone coordination. No cranial nerve deficit noted. PSYCHIATRIC: Normal mood and affect. Normal behavior. Normal judgment and thought content. CARDIOVASCULAR: Normal heart rate noted, regular rhythm RESPIRATORY: Effort and breath sounds normal, no problems with respiration noted ABDOMEN: Soft, nontender, nondistended. PELVIC: Deferred MUSCULOSKELETAL: Normal range of motion. No edema and no tenderness. 2+ distal pulses.    Labs: No results found for this or any previous visit (from the past 336 hour(s)).   Imaging Studies: 09/23/2019 PELVIS (TRANSABDOMINAL ONLY)  Result Date:  07/23/2019 Patient Name: Mary Parsons DOB: September 30, 1990 MRN: [id number] ULTRASOUND REPORT Location: [facility] Date of Service: 07/18/2019 Indications:Pelvic Pain Findings: The uterus is anteverted and measures 8.3 x 3.6 x 5.1 cm.. Echo texture is homogenous without evidence of focal masses. The Endometrium measures 8 mm. Right Ovary measures 3.1 x 2.2 x 2.0 cm. It is normal in appearance. Left Ovary measures 3.3 x 2.8 x 2.7 cm. It is normal in appearance.      Hypoechoic lesion with smooth walls and good through transmission      measuring 1.8 x 1.4 x 1.6 cm  Likely c/w follicle. Survey of the adnexa demonstrates no adnexal masses. There is no free fluid in the cul de sac. Impression: 1. Lt dominate follicle. Recommendations: 1.Clinical correlation with the patient's History and Physical Exam. Jenine M. 09/18/2019    RDMS The ultrasound images and findings were reviewed by me and I agree with  the above report. Elonda Husky, M.D. 07/23/2019 12:21 PM   Assessment:    1. Pelvic pain   2. History of ovarian cyst   3. Dyspareunia in female   4. Irregular menses    Plan:   Counseling: Procedure, risks, reasons, benefits and complications (including injury to bowel, bladder, major blood vessel, ureter, bleeding, possibility of transfusion, infection, or fistula formation) reviewed in detail. Likelihood of success in alleviating the patient's condition was discussed. Routine postoperative instructions will be reviewed with the patient and her family in detail after surgery.  The patient concurred with the proposed plan, giving informed written consent for the surgery.   Preop testing ordered. Instructions reviewed, including NPO after midnight.    Hildred Laser, MD [facility]

## 2019-08-13 NOTE — Telephone Encounter (Signed)
Please schedule patient for laparoscopic supracervical hysterectomy with bilateral salpingectomy and left oophorectomy, with use of morcellation device. She desires surgery September 6th.  She will need to be scheduled for a pre-op appointment the week before this in the office.

## 2019-08-14 ENCOUNTER — Other Ambulatory Visit: Payer: Self-pay | Admitting: Certified Nurse Midwife

## 2019-08-30 ENCOUNTER — Telehealth: Payer: Self-pay

## 2019-08-30 NOTE — Telephone Encounter (Signed)
Called pt to inform her that she did need to see [facility] for a preop before her surgery on the 23rd of August. Pt has an appointment to see [facility] for pre op on 09/04/2019.

## 2019-09-04 ENCOUNTER — Encounter: Payer: Self-pay | Admitting: Obstetrics and Gynecology

## 2019-09-04 ENCOUNTER — Ambulatory Visit (INDEPENDENT_AMBULATORY_CARE_PROVIDER_SITE_OTHER): Payer: BC Managed Care – PPO | Admitting: Obstetrics and Gynecology

## 2019-09-04 ENCOUNTER — Other Ambulatory Visit: Payer: Self-pay

## 2019-09-04 VITALS — BP 118/77 | HR 73 | Ht 62.0 in | Wt 172.6 lb

## 2019-09-04 DIAGNOSIS — Z01818 Encounter for other preprocedural examination: Secondary | ICD-10-CM

## 2019-09-04 DIAGNOSIS — R102 Pelvic and perineal pain: Secondary | ICD-10-CM

## 2019-09-04 DIAGNOSIS — N941 Unspecified dyspareunia: Secondary | ICD-10-CM

## 2019-09-04 DIAGNOSIS — Z8742 Personal history of other diseases of the female genital tract: Secondary | ICD-10-CM

## 2019-09-04 DIAGNOSIS — N926 Irregular menstruation, unspecified: Secondary | ICD-10-CM

## 2019-09-04 NOTE — Progress Notes (Signed)
    GYNECOLOGY PROGRESS NOTE  Subjective:    Patient ID: Mary Parsons, female    DOB: June 24, 1990, 29 y.o.   MRN: [id number]  HPI  Patient is a 29 y.o. [id number] female who presents for pre-operative appointment. She is currently scheduled to undergo LAVH with bilateral salpingectomy and left oophorectomy. Indications for procedure include recurrent left ovarian cyst, chronic pelvic pain, and irregular cycles despite use of contraception and Orilissa.  She has had previous workup for endometriosis which was negative. She desires definitive therapy. Denies complaints today.   The following portions of the patient's history were reviewed and updated as appropriate: allergies, current medications, past family history, past medical history, past social history, past surgical history and problem list.  Review of Systems Pertinent items noted in HPI and remainder of comprehensive ROS otherwise negative.   Objective:   Blood pressure 118/77, pulse 73, height 5\' 2"  (1.575 m), weight 172 lb 9.6 oz (78.3 kg), last menstrual period 08/25/2019, not currently breastfeeding. General appearance: alert and no distress  Lungs: Clear to auscultation.  Heart: S1 and S2 present. Regular rate and rhythm. Abdomen: soft, non-tender; bowel sounds normal; no masses,  no organomegaly Pelvic: external genitalia normal, rectovaginal septum normal.  Vagina without discharge.  Cervix normal appearing, no lesions and no motion tenderness.  Uterus mobile, nontender, normal shape and size. Small descensus present.  Adnexae non-palpable, nontender bilaterally.  Extremities: extremities normal, atraumatic, no cyanosis or edema Neurologic: Grossly normal   Assessment:   1. Pre-op examination   2. Pelvic pain   3. History of ovarian cyst   4. Dyspareunia in female   5. Irregular menses     Plan:   Patient desires definitive management with hysterectomy. She is scheduled for surgery 09/09/2019. I proposed doing a  laparoscopic-assisted vaginal hysterectomy (LAVH) prophylactic bilateral salpingectomy with left oophorectomy (with possible TLH). Patient agrees with this proposed surgery.  The risks of surgery were discussed in detail with the patient including but not limited to: bleeding which may require transfusion or reoperation; infection which may require antibiotics; injury to bowel, bladder, ureters or other surrounding organs; need for additional procedures including laparotomy; formation of adhesions; thromboembolic phenomenon; incisional problems and other postoperative/anesthesia complications.  Patient was also advised that she will remain in house for 1-2 nights; and expected recovery time after a hysterectomy is 6-8 weeks.  Patient was told that the likelihood that her condition and symptoms will be treated effectively with this surgical management was very high; the postoperative expectations were also discussed in detail. The patient also understands the alternative treatment options which were discussed in full. All questions were answered.  She was told that she will be contacted by our surgical scheduler regarding the time and date of her surgery; routine preoperative instructions will be given to her by the preoperative nursing team.   She is aware of need for preoperative COVID testing and subsequent quarantine from time of test to time of surgery; she will be given further preoperative instructions at that COVID screening visit.   Routine postoperative instructions will be reviewed with the patient in detail after surgery. Bleeding precautions were reviewed.    09/11/2019, MD [facility]

## 2019-09-04 NOTE — H&P (Signed)
GYNECOLOGY PREOPERATIVE HISTORY AND PHYSICAL  Subjective:  Mary Parsons is a 29 y.o. [id number] here for surgical management of recurrent left ovarian cysts, chronic pelvic pain, dyspareunia, and irregular cycles despite use of contraception and Orilissa. She has been worked up for endometriosis in the past surgically with negative biopsy findings.  She desires definitive therapy with hysterectomy. No significant preoperative concerns.  Proposed surgery: LAVH with bilateral salpingectomy and left oophorectomy   Pertinent Gynecological History: Contraception: tubal ligation Last pap: normal Date: 06/07/2018   Past Medical History:  Diagnosis Date  . GERD (gastroesophageal reflux disease)   . Heavy periods    Past Surgical History:  Procedure Laterality Date  . CESAREAN SECTION    . CESAREAN SECTION WITH BILATERAL TUBAL LIGATION Bilateral 04/24/2018   Procedure: REPEAT CESAREAN SECTION WITH BILATERAL TUBAL LIGATION;  Surgeon: Hildred Laser, MD;  Location: [facility];  Service: Obstetrics;  Laterality: Bilateral;  TOB: 12:11 Sex: Female Weight: 7 lb 1 oz  . DILATION AND EVACUATION N/A 11/18/2016   Procedure: DILATATION AND EVACUATION;  Surgeon: Hildred Laser, MD;  Location: [facility];  Service: Gynecology;  Laterality: N/A;  . LAPAROSCOPY N/A 11/18/2016   Procedure: LAPAROSCOPY DIAGNOSTIC;  Surgeon: Hildred Laser, MD;  Location: [facility];  Service: Gynecology;  Laterality: N/A;  . TONSILLECTOMY     OB History  Gravida Para Term Preterm AB Living  3 2 2   1 2   SAB TAB Ectopic Multiple Live Births  1     0 2    # Outcome Date GA Lbr Len/2nd Weight Sex Delivery Anes PTL Lv  3 Term 04/24/18 [redacted]w[redacted]d  7 lb 0.9 oz (3.2 kg) M CS-LTranv Spinal  LIV  2 SAB 11/18/16 [redacted]w[redacted]d         1 Term 08/21/15 [redacted]w[redacted]d  4 lb 2.2 oz (1.878 kg) M CS-Unspec   LIV     Complications: Fetal Intolerance  Patient denies any other pertinent gynecologic issues.  Family History  Problem Relation Age of Onset  .  Endometriosis Mother   . Endometriosis Maternal Aunt   . Endometriosis Maternal Grandmother   . Healthy Father   . Breast cancer Neg Hx   . Ovarian cancer Neg Hx   . Colon cancer Neg Hx    Social History   Socioeconomic History  . Marital status: Married    Spouse name: Not on file  . Number of children: Not on file  . Years of education: Not on file  . Highest education level: Not on file  Occupational History  . Not on file  Tobacco Use  . Smoking status: Never Smoker  . Smokeless tobacco: Never Used  Vaping Use  . Vaping Use: Never used  Substance and Sexual Activity  . Alcohol use: Yes    Comment: occasional   . Drug use: No  . Sexual activity: Yes    Birth control/protection: Surgical    Comment: Tubal ligation  Other Topics Concern  . Not on file  Social History Narrative  . Not on file   Social Determinants of Health   Financial Resource Strain:   . Difficulty of Paying Living Expenses:   Food Insecurity:   . Worried About [redacted]w[redacted]d in the Last Year:   . Programme researcher, broadcasting/film/video in the Last Year:   Transportation Needs:   . Barista (Medical):   Freight forwarder Lack of Transportation (Non-Medical):   Physical Activity:   . Days of Exercise per Week:   .  Minutes of Exercise per Session:   Stress:   . Feeling of Stress :   Social Connections:   . Frequency of Communication with Friends and Family:   . Frequency of Social Gatherings with Friends and Family:   . Attends Religious Services:   . Active Member of Clubs or Organizations:   . Attends Banker Meetings:   Marland Kitchen Marital Status:   Intimate Partner Violence:   . Fear of Current or Ex-Partner:   . Emotionally Abused:   Marland Kitchen Physically Abused:   . Sexually Abused:    Current Outpatient Medications on File Prior to Visit  Medication Sig Dispense Refill  . traMADol (ULTRAM) 50 MG tablet TAKE 1 TABLET BY MOUTH EVERY SIX HOURS AS NEEDED FOR UP TO FOR FIVE DAYS FOR MODERATE OR SEVERE PAIN  (Patient taking differently: Take 50 mg by mouth daily as needed (migraine). ) 20 tablet 0  . acetaminophen (TYLENOL) 500 MG tablet Take 1,000 mg by mouth every 6 (six) hours as needed for headache.    . [DISCONTINUED] escitalopram (LEXAPRO) 10 MG tablet Take 2 tablets (20 mg total) by mouth daily. 30 tablet 1   No current facility-administered medications on file prior to visit.   Allergies  Allergen Reactions  . Adhesive [Tape] Other (See Comments)    Burns skin off--tolerates PAPER TAPE ONLY      Review of Systems Constitutional: No recent fever/chills/sweats Respiratory: No recent cough/bronchitis Cardiovascular: No chest pain Gastrointestinal: No recent nausea/vomiting/diarrhea Genitourinary: No UTI symptoms Hematologic/lymphatic:No history of coagulopathy or recent blood thinner use    Objective:   Blood pressure 118/77, pulse 73, height 5\' 2"  (1.575 m), weight 172 lb 9.6 oz (78.3 kg), last menstrual period 08/25/2019, not currently breastfeeding. CONSTITUTIONAL: Well-developed, well-nourished female in no acute distress.  HENT:  Normocephalic, atraumatic, External right and left ear normal. Oropharynx is clear and moist EYES: Conjunctivae and EOM are normal. Pupils are equal, round, and reactive to light. No scleral icterus.  NECK: Normal range of motion, supple, no masses SKIN: Skin is warm and dry. No rash noted. Not diaphoretic. No erythema. No pallor. NEUROLOGIC: Alert and oriented to person, place, and time. Normal reflexes, muscle tone coordination. No cranial nerve deficit noted. PSYCHIATRIC: Normal mood and affect. Normal behavior. Normal judgment and thought content. CARDIOVASCULAR: Normal heart rate noted, regular rhythm RESPIRATORY: Effort and breath sounds normal, no problems with respiration noted ABDOMEN: Soft, nontender, nondistended. PELVIC: Deferred MUSCULOSKELETAL: Normal range of motion. No edema and no tenderness. 2+ distal pulses.    Labs: No  results found for this or any previous visit (from the past 336 hour(s)).  Imaging Studies: 10/25/2019 PELVIS (TRANSABDOMINAL ONLY) Patient Name: Rameen Gohlke DOB: [birth year] MRN: [id number] ULTRASOUND REPORT  Location: [facility]  Date of Service: 07/18/2019   Indications:Pelvic Pain Findings:  The uterus is anteverted and measures 8.3 x 3.6 x 5.1 cm.. Echo texture is homogenous without evidence of focal masses.  The Endometrium measures 8 mm.  Right Ovary measures 3.1 x 2.2 x 2.0 cm. It is normal in appearance. Left Ovary measures 3.3 x 2.8 x 2.7 cm. It is normal in appearance.      Hypoechoic lesion with smooth walls and good through transmission      measuring 1.8 x 1.4 x 1.6 cm  Likely c/w follicle. Survey of the adnexa demonstrates no adnexal masses. There is no free fluid in the cul de sac.  Impression: 1. Lt dominate follicle.  Recommendations: 1.Clinical correlation with the patient's  History and Physical Exam.  Jenine M. Marciano Sequin    RDMS  The ultrasound images and findings were reviewed by me and I agree with  the above report.  Elonda Husky, M.D. 07/23/2019 12:21 PM    Assessment:    1. Pre-op examination   2. Pelvic pain   3. History of ovarian cyst   4. Dyspareunia in female   5. Irregular menses      Plan:    Counseling: Procedure, risks, reasons, benefits and complications (including injury to bowel, bladder, major blood vessel, ureter, bleeding, possibility of transfusion, infection, or fistula formation) reviewed in detail. Likelihood of success in alleviating the patient's condition was discussed. Routine postoperative instructions will be reviewed with the patient and her family in detail after surgery.  The patient concurred with the proposed plan, giving informed written consent for the surgery.   Preop testing ordered. Instructions reviewed, including NPO after midnight.

## 2019-09-04 NOTE — Progress Notes (Signed)
Pt present for pre-op. Pt stated that she was doing well no problems.  

## 2019-09-04 NOTE — H&P (View-Only) (Signed)
GYNECOLOGY PREOPERATIVE HISTORY AND PHYSICAL  Subjective:  Mary Parsons is a 29 y.o. [id number] here for surgical management of recurrent left ovarian cysts, chronic pelvic pain, dyspareunia, and irregular cycles despite use of contraception and Orilissa. She has been worked up for endometriosis in the past surgically with negative biopsy findings.  She desires definitive therapy with hysterectomy. No significant preoperative concerns.  Proposed surgery: LAVH with bilateral salpingectomy and left oophorectomy   Pertinent Gynecological History: Contraception: tubal ligation Last pap: normal Date: 06/07/2018   Past Medical History:  Diagnosis Date  . GERD (gastroesophageal reflux disease)   . Heavy periods    Past Surgical History:  Procedure Laterality Date  . CESAREAN SECTION    . CESAREAN SECTION WITH BILATERAL TUBAL LIGATION Bilateral 04/24/2018   Procedure: REPEAT CESAREAN SECTION WITH BILATERAL TUBAL LIGATION;  Surgeon: Hildred Laser, MD;  Location: [facility];  Service: Obstetrics;  Laterality: Bilateral;  TOB: 12:11 Sex: Female Weight: 7 lb 1 oz  . DILATION AND EVACUATION N/A 11/18/2016   Procedure: DILATATION AND EVACUATION;  Surgeon: Hildred Laser, MD;  Location: [facility];  Service: Gynecology;  Laterality: N/A;  . LAPAROSCOPY N/A 11/18/2016   Procedure: LAPAROSCOPY DIAGNOSTIC;  Surgeon: Hildred Laser, MD;  Location: [facility];  Service: Gynecology;  Laterality: N/A;  . TONSILLECTOMY     OB History  Gravida Para Term Preterm AB Living  3 2 2   1 2   SAB TAB Ectopic Multiple Live Births  1     0 2    # Outcome Date GA Lbr Len/2nd Weight Sex Delivery Anes PTL Lv  3 Term 04/24/18 [redacted]w[redacted]d  7 lb 0.9 oz (3.2 kg) M CS-LTranv Spinal  LIV  2 SAB 11/18/16 [redacted]w[redacted]d         1 Term 08/21/15 [redacted]w[redacted]d  4 lb 2.2 oz (1.878 kg) M CS-Unspec   LIV     Complications: Fetal Intolerance  Patient denies any other pertinent gynecologic issues.  Family History  Problem Relation Age of Onset  .  Endometriosis Mother   . Endometriosis Maternal Aunt   . Endometriosis Maternal Grandmother   . Healthy Father   . Breast cancer Neg Hx   . Ovarian cancer Neg Hx   . Colon cancer Neg Hx    Social History   Socioeconomic History  . Marital status: Married    Spouse name: Not on file  . Number of children: Not on file  . Years of education: Not on file  . Highest education level: Not on file  Occupational History  . Not on file  Tobacco Use  . Smoking status: Never Smoker  . Smokeless tobacco: Never Used  Vaping Use  . Vaping Use: Never used  Substance and Sexual Activity  . Alcohol use: Yes    Comment: occasional   . Drug use: No  . Sexual activity: Yes    Birth control/protection: Surgical    Comment: Tubal ligation  Other Topics Concern  . Not on file  Social History Narrative  . Not on file   Social Determinants of Health   Financial Resource Strain:   . Difficulty of Paying Living Expenses:   Food Insecurity:   . Worried About [redacted]w[redacted]d in the Last Year:   . Programme researcher, broadcasting/film/video in the Last Year:   Transportation Needs:   . Barista (Medical):   Freight forwarder Lack of Transportation (Non-Medical):   Physical Activity:   . Days of Exercise per Week:   .  Minutes of Exercise per Session:   Stress:   . Feeling of Stress :   Social Connections:   . Frequency of Communication with Friends and Family:   . Frequency of Social Gatherings with Friends and Family:   . Attends Religious Services:   . Active Member of Clubs or Organizations:   . Attends Club or Organization Meetings:   . Marital Status:   Intimate Partner Violence:   . Fear of Current or Ex-Partner:   . Emotionally Abused:   . Physically Abused:   . Sexually Abused:    Current Outpatient Medications on File Prior to Visit  Medication Sig Dispense Refill  . traMADol (ULTRAM) 50 MG tablet TAKE 1 TABLET BY MOUTH EVERY SIX HOURS AS NEEDED FOR UP TO FOR FIVE DAYS FOR MODERATE OR SEVERE PAIN  (Patient taking differently: Take 50 mg by mouth daily as needed (migraine). ) 20 tablet 0  . acetaminophen (TYLENOL) 500 MG tablet Take 1,000 mg by mouth every 6 (six) hours as needed for headache.    . [DISCONTINUED] escitalopram (LEXAPRO) 10 MG tablet Take 2 tablets (20 mg total) by mouth daily. 30 tablet 1   No current facility-administered medications on file prior to visit.   Allergies  Allergen Reactions  . Adhesive [Tape] Other (See Comments)    Burns skin off--tolerates PAPER TAPE ONLY      Review of Systems Constitutional: No recent fever/chills/sweats Respiratory: No recent cough/bronchitis Cardiovascular: No chest pain Gastrointestinal: No recent nausea/vomiting/diarrhea Genitourinary: No UTI symptoms Hematologic/lymphatic:No history of coagulopathy or recent blood thinner use    Objective:   Blood pressure 118/77, pulse 73, height 5' 2" (1.575 m), weight 172 lb 9.6 oz (78.3 kg), last menstrual period 08/25/2019, not currently breastfeeding. CONSTITUTIONAL: Well-developed, well-nourished female in no acute distress.  HENT:  Normocephalic, atraumatic, External right and left ear normal. Oropharynx is clear and moist EYES: Conjunctivae and EOM are normal. Pupils are equal, round, and reactive to light. No scleral icterus.  NECK: Normal range of motion, supple, no masses SKIN: Skin is warm and dry. No rash noted. Not diaphoretic. No erythema. No pallor. NEUROLOGIC: Alert and oriented to person, place, and time. Normal reflexes, muscle tone coordination. No cranial nerve deficit noted. PSYCHIATRIC: Normal mood and affect. Normal behavior. Normal judgment and thought content. CARDIOVASCULAR: Normal heart rate noted, regular rhythm RESPIRATORY: Effort and breath sounds normal, no problems with respiration noted ABDOMEN: Soft, nontender, nondistended. PELVIC: Deferred MUSCULOSKELETAL: Normal range of motion. No edema and no tenderness. 2+ distal pulses.    Labs: No  results found for this or any previous visit (from the past 336 hour(s)).  Imaging Studies: US PELVIS (TRANSABDOMINAL ONLY) Patient Name: Mary Parsons DOB: 03/26/1990 MRN: [id number] ULTRASOUND REPORT  Location: [facility]  Date of Service: 07/18/2019   Indications:Pelvic Pain Findings:  The uterus is anteverted and measures 8.3 x 3.6 x 5.1 cm.. Echo texture is homogenous without evidence of focal masses.  The Endometrium measures 8 mm.  Right Ovary measures 3.1 x 2.2 x 2.0 cm. It is normal in appearance. Left Ovary measures 3.3 x 2.8 x 2.7 cm. It is normal in appearance.      Hypoechoic lesion with smooth walls and good through transmission      measuring 1.8 x 1.4 x 1.6 cm  Likely c/w follicle. Survey of the adnexa demonstrates no adnexal masses. There is no free fluid in the cul de sac.  Impression: 1. Lt dominate follicle.  Recommendations: 1.Clinical correlation with the patient's   History and Physical Exam.  Jenine M. Marciano Sequin    RDMS  The ultrasound images and findings were reviewed by me and I agree with  the above report.  Elonda Husky, M.D. 07/23/2019 12:21 PM    Assessment:    1. Pre-op examination   2. Pelvic pain   3. History of ovarian cyst   4. Dyspareunia in female   5. Irregular menses      Plan:    Counseling: Procedure, risks, reasons, benefits and complications (including injury to bowel, bladder, major blood vessel, ureter, bleeding, possibility of transfusion, infection, or fistula formation) reviewed in detail. Likelihood of success in alleviating the patient's condition was discussed. Routine postoperative instructions will be reviewed with the patient and her family in detail after surgery.  The patient concurred with the proposed plan, giving informed written consent for the surgery.   Preop testing ordered. Instructions reviewed, including NPO after midnight.

## 2019-09-05 ENCOUNTER — Encounter
Admission: RE | Admit: 2019-09-05 | Discharge: 2019-09-05 | Disposition: A | Discharge status: Home / Self Care | Payer: BC Managed Care – PPO | Source: Ambulatory Visit | Attending: Obstetrics and Gynecology | Admitting: Obstetrics and Gynecology

## 2019-09-05 ENCOUNTER — Other Ambulatory Visit: Payer: Self-pay

## 2019-09-05 DIAGNOSIS — Z01812 Encounter for preprocedural laboratory examination: Secondary | ICD-10-CM | POA: Diagnosis not present

## 2019-09-05 HISTORY — DX: Headache, unspecified: R51.9

## 2019-09-05 HISTORY — DX: Other complications of anesthesia, initial encounter: T88.59XA

## 2019-09-05 HISTORY — DX: Family history of other specified conditions: Z84.89

## 2019-09-05 HISTORY — DX: Anxiety disorder, unspecified: F41.9

## 2019-09-05 HISTORY — DX: Personal history of urinary calculi: Z87.442

## 2019-09-05 NOTE — Patient Instructions (Addendum)
Your procedure is scheduled on: 09-09-19 MONDAY Report to Same Day Surgery 2nd floor medical mall [facility] Entrance-take elevator on left to 2nd floor.  Check in with surgery information desk.) To find out your arrival time please call [phone] between 1PM - 3PM on 09-06-19 FRIDAY  Remember: Instructions that are not followed completely may result in serious medical risk, up to and including death, or upon the discretion of your surgeon and anesthesiologist your surgery may need to be rescheduled.    _x___ 1. Do not eat food after midnight the night before your procedure. NO GUM OR CANDY AFTER MIDNIGHT. You may drink clear liquids up to 2 hours before you are scheduled to arrive at the hospital for your procedure.  Do not drink clear liquids within 2 hours of your scheduled arrival to the hospital.  Clear liquids include  --Water or Apple juice without pulp  --Gatorade  --Black Coffee or Clear Tea (No milk, no creamers, do not add anything to the coffee or Tea-OK TO ADD SUGAR)  _X___Ensure clear carbohydrate drink-FINISH DRINK 2 HOURS PRIOR TO ARRIVAL TIME TO HOSPITAL THE DAY OF YOUR SURGERY     __x__ 2. No Alcohol for 24 hours before or after surgery.   __x__3. No Smoking or e-cigarettes for 24 prior to surgery.  Do not use any chewable tobacco products for at least 6 hour prior to surgery   ____  4. Bring all medications with you on the day of surgery if instructed.    __x__ 5. Notify your doctor if there is any change in your medical condition     (cold, fever, infections).    x___6. On the morning of surgery brush your teeth with toothpaste and water.  You may rinse your mouth with mouth wash if you wish.  Do not swallow any toothpaste or mouthwash.   Do not wear jewelry, make-up, hairpins, clips or nail polish.  Do not wear lotions, powders, or perfumes.   Do not shave 48 hours prior to surgery. Men may shave face and neck.  Do not bring valuables to the hospital.     [facility] is not responsible for any belongings or valuables.               Contacts, dentures or bridgework may not be worn into surgery.  Leave your suitcase in the car. After surgery it may be brought to your room.  For patients admitted to the hospital, discharge time is determined by your treatment team.  _  Patients discharged the day of surgery will not be allowed to drive home.  You will need someone to drive you home and stay with you the night of your procedure.    Please read over the following fact sheets that you were given:   [facility] Preparing for Surgery /INCENTIVE SPIROMETER INSTRUCTIONS-THE INCENTIVE SPIROMETER DEVICE WILL BE GIVEN TO YOU THE DAY OF YOUR SURGERY  _x___ TAKE THE FOLLOWING MEDICATION THE MORNING OF SURGERY WITH  A SMALL SIP OF WATER. These include:  1. YOU MAY TAKE YOUR TRAMADOL (ULTRAM) THE MORNING OF SURGERY IF YOU HAVE  A MIGRAINE  2.  3.  4.  5.  6.  ____Fleets enema or Magnesium Citrate as directed.   _x___ Use CHG Soap as directed on instruction sheet   ____ Use inhalers on the day of surgery and bring to hospital day of surgery  ____ Stop Metformin and Janumet 2 days prior to surgery.    ____ Take  1/2 of usual insulin dose the night before surgery and none on the morning surgery.   ____ Follow recommendations from Cardiologist, Pulmonologist or PCP regarding stopping Aspirin, Coumadin, Plavix ,Eliquis, Effient, or Pradaxa, and Pletal.  X____Stop Anti-inflammatories such as Advil, Aleve, Ibuprofen, Motrin, Naproxen, Naprosyn, Goodies powders or aspirin products NOW- OK to take TYLENOL OR TRAMADOL IF NEEDED   ____ Stop supplements until after surgery.     ____ Bring C-Pap to the hospital.

## 2019-09-06 ENCOUNTER — Other Ambulatory Visit
Admission: RE | Admit: 2019-09-06 | Discharge: 2019-09-06 | Disposition: A | Discharge status: Home / Self Care | Payer: BC Managed Care – PPO | Source: Ambulatory Visit | Attending: Obstetrics and Gynecology | Admitting: Obstetrics and Gynecology

## 2019-09-06 DIAGNOSIS — Z20822 Contact with and (suspected) exposure to covid-19: Secondary | ICD-10-CM | POA: Diagnosis not present

## 2019-09-06 DIAGNOSIS — Z01812 Encounter for preprocedural laboratory examination: Secondary | ICD-10-CM | POA: Insufficient documentation

## 2019-09-06 LAB — TYPE AND SCREEN
ABO/RH(D): A POS
Antibody Screen: NEGATIVE

## 2019-09-06 LAB — CBC
HCT: 38.3 % (ref 36.0–46.0)
Hemoglobin: 13.3 g/dL (ref 12.0–15.0)
MCH: 30.3 pg (ref 26.0–34.0)
MCHC: 34.7 g/dL (ref 30.0–36.0)
MCV: 87.2 fL (ref 80.0–100.0)
Platelets: 296 10*3/uL (ref 150–400)
RBC: 4.39 MIL/uL (ref 3.87–5.11)
RDW: 11.7 % (ref 11.5–15.5)
WBC: 5.5 10*3/uL (ref 4.0–10.5)
nRBC: 0 % (ref 0.0–0.2)

## 2019-09-06 LAB — SARS CORONAVIRUS 2 (TAT 6-24 HRS): SARS Coronavirus 2: NEGATIVE

## 2019-09-09 ENCOUNTER — Ambulatory Visit: Payer: BC Managed Care – PPO | Admitting: Anesthesiology

## 2019-09-09 ENCOUNTER — Encounter
Admission: RE | Disposition: A | Discharge status: Home / Self Care | Payer: Self-pay | Source: Home / Self Care | Attending: Obstetrics and Gynecology

## 2019-09-09 ENCOUNTER — Observation Stay
Admission: RE | Admit: 2019-09-09 | Discharge: 2019-09-10 | Disposition: A | Discharge status: Home / Self Care | Payer: BC Managed Care – PPO | Attending: Obstetrics and Gynecology | Admitting: Obstetrics and Gynecology

## 2019-09-09 ENCOUNTER — Other Ambulatory Visit: Payer: Self-pay

## 2019-09-09 ENCOUNTER — Encounter: Payer: Self-pay | Admitting: Obstetrics and Gynecology

## 2019-09-09 DIAGNOSIS — Z98891 History of uterine scar from previous surgery: Secondary | ICD-10-CM | POA: Diagnosis not present

## 2019-09-09 DIAGNOSIS — N946 Dysmenorrhea, unspecified: Principal | ICD-10-CM | POA: Insufficient documentation

## 2019-09-09 DIAGNOSIS — N926 Irregular menstruation, unspecified: Secondary | ICD-10-CM | POA: Diagnosis not present

## 2019-09-09 DIAGNOSIS — N941 Unspecified dyspareunia: Secondary | ICD-10-CM

## 2019-09-09 DIAGNOSIS — N83292 Other ovarian cyst, left side: Secondary | ICD-10-CM | POA: Insufficient documentation

## 2019-09-09 DIAGNOSIS — R102 Pelvic and perineal pain unspecified side: Secondary | ICD-10-CM

## 2019-09-09 DIAGNOSIS — Z90710 Acquired absence of both cervix and uterus: Secondary | ICD-10-CM

## 2019-09-09 DIAGNOSIS — N83202 Unspecified ovarian cyst, left side: Secondary | ICD-10-CM

## 2019-09-09 HISTORY — PX: LAPAROSCOPIC UNILATERAL SALPINGO OOPHERECTOMY: SHX5935

## 2019-09-09 HISTORY — PX: LAPAROSCOPIC VAGINAL HYSTERECTOMY WITH SALPINGO OOPHORECTOMY: SHX6681

## 2019-09-09 LAB — COMPREHENSIVE METABOLIC PANEL
ALT: 17 U/L (ref 0–44)
AST: 18 U/L (ref 15–41)
Albumin: 4 g/dL (ref 3.5–5.0)
Alkaline Phosphatase: 79 U/L (ref 38–126)
Anion gap: 9 (ref 5–15)
BUN: 15 mg/dL (ref 6–20)
CO2: 25 mmol/L (ref 22–32)
Calcium: 9.1 mg/dL (ref 8.9–10.3)
Chloride: 104 mmol/L (ref 98–111)
Creatinine, Ser: 0.7 mg/dL (ref 0.44–1.00)
GFR calc Af Amer: >60 mL/min (ref >60)
GFR calc non Af Amer: >60 mL/min (ref >60)
Glucose, Bld: 99 mg/dL (ref 70–99)
Potassium: 3.7 mmol/L (ref 3.5–5.1)
Sodium: 138 mmol/L (ref 135–145)
Total Bilirubin: 0.6 mg/dL (ref 0.3–1.2)
Total Protein: 7.2 g/dL (ref 6.5–8.1)

## 2019-09-09 LAB — POCT PREGNANCY, URINE: Preg Test, Ur: NEGATIVE

## 2019-09-09 SURGERY — HYSTERECTOMY, VAGINAL, LAPAROSCOPY-ASSISTED, WITH SALPINGO-OOPHORECTOMY
Anesthesia: General | Laterality: Left

## 2019-09-09 MED ORDER — FENTANYL CITRATE (PF) 100 MCG/2ML IJ SOLN
INTRAMUSCULAR | Status: AC
  Filled 2019-09-09: qty 2

## 2019-09-09 MED ORDER — KETAMINE HCL 50 MG/ML IJ SOLN
INTRAMUSCULAR | Status: DC | PRN
  Administered 2019-09-09: 50 mg via INTRAMUSCULAR

## 2019-09-09 MED ORDER — GABAPENTIN 100 MG PO CAPS
100.0000 mg | ORAL_CAPSULE | Freq: Three times a day (TID) | ORAL | Status: DC
  Administered 2019-09-09 – 2019-09-10 (×3): 100 mg via ORAL
  Filled 2019-09-09 (×3): qty 1

## 2019-09-09 MED ORDER — VASOPRESSIN 20 UNIT/ML IV SOLN
INTRAVENOUS | Status: AC
  Filled 2019-09-09: qty 1

## 2019-09-09 MED ORDER — FENTANYL CITRATE (PF) 100 MCG/2ML IJ SOLN
25.0000 ug | INTRAMUSCULAR | Status: DC | PRN
  Administered 2019-09-09 (×3): 25 ug via INTRAVENOUS

## 2019-09-09 MED ORDER — KETOROLAC TROMETHAMINE 30 MG/ML IJ SOLN
INTRAMUSCULAR | Status: AC
  Filled 2019-09-09: qty 1

## 2019-09-09 MED ORDER — VASOPRESSIN 20 UNIT/ML IV SOLN
INTRAVENOUS | Status: DC | PRN
  Administered 2019-09-09: 20 mL via INTRAMUSCULAR

## 2019-09-09 MED ORDER — SODIUM CHLORIDE FLUSH 0.9 % IV SOLN
INTRAVENOUS | Status: AC
  Filled 2019-09-09: qty 50

## 2019-09-09 MED ORDER — BISACODYL 10 MG RE SUPP: 10.0000 mg | Freq: Every day | RECTAL | Status: DC | PRN

## 2019-09-09 MED ORDER — KETOROLAC TROMETHAMINE 30 MG/ML IJ SOLN
INTRAMUSCULAR | Status: DC | PRN
  Administered 2019-09-09: 30 mg via INTRAVENOUS

## 2019-09-09 MED ORDER — HYDROMORPHONE HCL 1 MG/ML IJ SOLN
1.0000 mg | INTRAMUSCULAR | Status: DC | PRN
  Administered 2019-09-09: 1 mg via INTRAVENOUS
  Filled 2019-09-09: qty 1

## 2019-09-09 MED ORDER — SENNOSIDES-DOCUSATE SODIUM 8.6-50 MG PO TABS: 1.0000 | ORAL_TABLET | Freq: Every evening | ORAL | Status: DC | PRN

## 2019-09-09 MED ORDER — ACETAMINOPHEN 10 MG/ML IV SOLN
INTRAVENOUS | Status: AC
  Filled 2019-09-09: qty 100

## 2019-09-09 MED ORDER — ROCURONIUM BROMIDE 10 MG/ML (PF) SYRINGE
PREFILLED_SYRINGE | INTRAVENOUS | Status: AC
  Filled 2019-09-09: qty 10

## 2019-09-09 MED ORDER — POVIDONE-IODINE 10 % EX SWAB: 2.0000 "application " | Freq: Once | CUTANEOUS | Status: DC

## 2019-09-09 MED ORDER — FENTANYL CITRATE (PF) 100 MCG/2ML IJ SOLN
50.0000 ug | Freq: Once | INTRAMUSCULAR | Status: AC
  Administered 2019-09-09: 50 ug via INTRAVENOUS

## 2019-09-09 MED ORDER — PROPOFOL 10 MG/ML IV BOLUS
INTRAVENOUS | Status: DC | PRN
  Administered 2019-09-09: 150 mg via INTRAVENOUS

## 2019-09-09 MED ORDER — CEFAZOLIN SODIUM-DEXTROSE 2-4 GM/100ML-% IV SOLN
INTRAVENOUS | Status: AC
  Filled 2019-09-09: qty 100

## 2019-09-09 MED ORDER — IBUPROFEN 800 MG PO TABS: 800.0000 mg | ORAL_TABLET | Freq: Four times a day (QID) | ORAL | Status: DC

## 2019-09-09 MED ORDER — ACETAMINOPHEN 500 MG PO TABS
1000.0000 mg | ORAL_TABLET | Freq: Three times a day (TID) | ORAL | Status: DC
  Administered 2019-09-09 – 2019-09-10 (×2): 1000 mg via ORAL
  Filled 2019-09-09 (×2): qty 2

## 2019-09-09 MED ORDER — ONDANSETRON HCL 4 MG/2ML IJ SOLN
INTRAMUSCULAR | Status: DC | PRN
  Administered 2019-09-09: 4 mg via INTRAVENOUS

## 2019-09-09 MED ORDER — LACTATED RINGERS IV SOLN: INTRAVENOUS | Status: DC

## 2019-09-09 MED ORDER — MIDAZOLAM HCL 2 MG/2ML IJ SOLN
INTRAMUSCULAR | Status: AC
  Filled 2019-09-09: qty 2

## 2019-09-09 MED ORDER — BUPIVACAINE HCL (PF) 0.5 % IJ SOLN
INTRAMUSCULAR | Status: AC
  Filled 2019-09-09: qty 30

## 2019-09-09 MED ORDER — ONDANSETRON HCL 4 MG/2ML IJ SOLN
INTRAMUSCULAR | Status: AC
  Filled 2019-09-09: qty 2

## 2019-09-09 MED ORDER — MIDAZOLAM HCL 2 MG/2ML IJ SOLN
INTRAMUSCULAR | Status: DC | PRN
  Administered 2019-09-09: 2 mg via INTRAVENOUS

## 2019-09-09 MED ORDER — SIMETHICONE 80 MG PO CHEW
80.0000 mg | CHEWABLE_TABLET | Freq: Four times a day (QID) | ORAL | Status: DC | PRN
  Administered 2019-09-09 – 2019-09-10 (×3): 80 mg via ORAL
  Filled 2019-09-09 (×4): qty 1

## 2019-09-09 MED ORDER — MAGNESIUM CITRATE PO SOLN
1.0000 | Freq: Once | ORAL | Status: DC | PRN
  Filled 2019-09-09: qty 296

## 2019-09-09 MED ORDER — FENTANYL CITRATE (PF) 100 MCG/2ML IJ SOLN
INTRAMUSCULAR | Status: AC
  Administered 2019-09-09: 25 ug via INTRAVENOUS
  Filled 2019-09-09: qty 2

## 2019-09-09 MED ORDER — DOCUSATE SODIUM 100 MG PO CAPS
100.0000 mg | ORAL_CAPSULE | Freq: Two times a day (BID) | ORAL | Status: DC
  Administered 2019-09-09 – 2019-09-10 (×2): 100 mg via ORAL
  Filled 2019-09-09 (×2): qty 1

## 2019-09-09 MED ORDER — ZOLPIDEM TARTRATE 5 MG PO TABS: 5.0000 mg | ORAL_TABLET | Freq: Every evening | ORAL | Status: DC | PRN

## 2019-09-09 MED ORDER — CHLORHEXIDINE GLUCONATE 0.12 % MT SOLN
OROMUCOSAL | Status: AC
  Administered 2019-09-09: 15 mL via OROMUCOSAL
  Filled 2019-09-09: qty 15

## 2019-09-09 MED ORDER — KETOROLAC TROMETHAMINE 30 MG/ML IJ SOLN
30.0000 mg | Freq: Four times a day (QID) | INTRAMUSCULAR | Status: DC
  Administered 2019-09-09 – 2019-09-10 (×3): 30 mg via INTRAVENOUS
  Filled 2019-09-09 (×3): qty 1

## 2019-09-09 MED ORDER — HYDROMORPHONE HCL 1 MG/ML IJ SOLN
INTRAMUSCULAR | Status: AC
  Administered 2019-09-09: 0.5 mg via INTRAVENOUS
  Filled 2019-09-09: qty 1

## 2019-09-09 MED ORDER — FAMOTIDINE 20 MG PO TABS
ORAL_TABLET | ORAL | Status: AC
  Administered 2019-09-09: 20 mg via ORAL
  Filled 2019-09-09: qty 1

## 2019-09-09 MED ORDER — LIDOCAINE HCL (CARDIAC) PF 100 MG/5ML IV SOSY
PREFILLED_SYRINGE | INTRAVENOUS | Status: DC | PRN
  Administered 2019-09-09: 80 mg via INTRAVENOUS

## 2019-09-09 MED ORDER — FENTANYL CITRATE (PF) 100 MCG/2ML IJ SOLN
INTRAMUSCULAR | Status: AC
  Administered 2019-09-09: 50 ug via INTRAVENOUS
  Filled 2019-09-09: qty 2

## 2019-09-09 MED ORDER — ROCURONIUM BROMIDE 100 MG/10ML IV SOLN
INTRAVENOUS | Status: DC | PRN
  Administered 2019-09-09 (×2): 20 mg via INTRAVENOUS
  Administered 2019-09-09: 50 mg via INTRAVENOUS

## 2019-09-09 MED ORDER — PROMETHAZINE HCL 25 MG/ML IJ SOLN: 6.2500 mg | INTRAMUSCULAR | Status: DC | PRN

## 2019-09-09 MED ORDER — FAMOTIDINE 20 MG PO TABS: 20.0000 mg | ORAL_TABLET | Freq: Once | ORAL | Status: AC

## 2019-09-09 MED ORDER — HYDROMORPHONE HCL 1 MG/ML IJ SOLN
0.5000 mg | INTRAMUSCULAR | Status: AC | PRN
  Administered 2019-09-09 (×2): 0.5 mg via INTRAVENOUS

## 2019-09-09 MED ORDER — CHLORHEXIDINE GLUCONATE 0.12 % MT SOLN: 15.0000 mL | Freq: Once | OROMUCOSAL | Status: AC

## 2019-09-09 MED ORDER — KETAMINE HCL 50 MG/ML IJ SOLN
INTRAMUSCULAR | Status: AC
  Filled 2019-09-09: qty 10

## 2019-09-09 MED ORDER — ORAL CARE MOUTH RINSE: 15.0000 mL | Freq: Once | OROMUCOSAL | Status: AC

## 2019-09-09 MED ORDER — BUPIVACAINE HCL 0.5 % IJ SOLN
INTRAMUSCULAR | Status: DC | PRN
  Administered 2019-09-09: 10 mL

## 2019-09-09 MED ORDER — ONDANSETRON HCL 4 MG/2ML IJ SOLN: 4.0000 mg | Freq: Four times a day (QID) | INTRAMUSCULAR | Status: DC | PRN

## 2019-09-09 MED ORDER — MENTHOL 3 MG MT LOZG
1.0000 | LOZENGE | OROMUCOSAL | Status: DC | PRN
  Filled 2019-09-09: qty 9

## 2019-09-09 MED ORDER — ACETAMINOPHEN 10 MG/ML IV SOLN
INTRAVENOUS | Status: DC | PRN
  Administered 2019-09-09: 1000 mg via INTRAVENOUS

## 2019-09-09 MED ORDER — FENTANYL CITRATE (PF) 100 MCG/2ML IJ SOLN
INTRAMUSCULAR | Status: DC | PRN
  Administered 2019-09-09 (×4): 50 ug via INTRAVENOUS

## 2019-09-09 MED ORDER — DEXAMETHASONE SODIUM PHOSPHATE 10 MG/ML IJ SOLN
INTRAMUSCULAR | Status: DC | PRN
  Administered 2019-09-09: 10 mg via INTRAVENOUS

## 2019-09-09 MED ORDER — PANTOPRAZOLE SODIUM 40 MG PO TBEC
40.0000 mg | DELAYED_RELEASE_TABLET | Freq: Every day | ORAL | Status: DC
  Administered 2019-09-09 – 2019-09-10 (×2): 40 mg via ORAL
  Filled 2019-09-09 (×2): qty 1

## 2019-09-09 MED ORDER — ONDANSETRON HCL 4 MG PO TABS: 4.0000 mg | ORAL_TABLET | Freq: Four times a day (QID) | ORAL | Status: DC | PRN

## 2019-09-09 MED ORDER — ALUM & MAG HYDROXIDE-SIMETH 200-200-20 MG/5ML PO SUSP: 30.0000 mL | ORAL | Status: DC | PRN

## 2019-09-09 MED ORDER — DEXAMETHASONE SODIUM PHOSPHATE 10 MG/ML IJ SOLN
INTRAMUSCULAR | Status: AC
  Filled 2019-09-09: qty 1

## 2019-09-09 MED ORDER — TRAMADOL HCL 50 MG PO TABS
100.0000 mg | ORAL_TABLET | Freq: Four times a day (QID) | ORAL | Status: DC | PRN
  Administered 2019-09-09 – 2019-09-10 (×2): 100 mg via ORAL
  Filled 2019-09-09 (×2): qty 2

## 2019-09-09 MED ORDER — LIDOCAINE HCL (PF) 2 % IJ SOLN
INTRAMUSCULAR | Status: AC
  Filled 2019-09-09: qty 5

## 2019-09-09 MED ORDER — OXYCODONE-ACETAMINOPHEN 5-325 MG PO TABS
1.0000 | ORAL_TABLET | ORAL | Status: DC | PRN
  Administered 2019-09-09: 2 via ORAL
  Filled 2019-09-09 (×2): qty 2

## 2019-09-09 MED ORDER — CEFAZOLIN SODIUM-DEXTROSE 2-4 GM/100ML-% IV SOLN
2.0000 g | INTRAVENOUS | Status: AC
  Administered 2019-09-09: 2 g via INTRAVENOUS

## 2019-09-09 SURGICAL SUPPLY — 71 items
BAG URINE DRAIN 2000ML AR STRL (UROLOGICAL SUPPLIES) ×3 IMPLANT
BLADE SURG SZ11 CARB STEEL (BLADE) ×3 IMPLANT
CANISTER SUCT 1200ML W/VALVE (MISCELLANEOUS) ×3 IMPLANT
CATH FOL 2WAY LX 16X5 (CATHETERS) ×2 IMPLANT
CATH FOLEY 2WAY  5CC 16FR (CATHETERS) ×2
CATH ROBINSON RED A/P 16FR (CATHETERS) ×3 IMPLANT
CATH URTH 16FR FL 2W BLN LF (CATHETERS) ×1 IMPLANT
CHLORAPREP W/TINT 26 (MISCELLANEOUS) ×3 IMPLANT
CLOSURE WOUND 1/2 X4 (GAUZE/BANDAGES/DRESSINGS) ×1
CORD MONOPOLAR M/FML 12FT (MISCELLANEOUS) ×3 IMPLANT
COVER WAND RF STERILE (DRAPES) ×3 IMPLANT
DERMABOND ADVANCED (GAUZE/BANDAGES/DRESSINGS) ×2
DERMABOND ADVANCED .7 DNX12 (GAUZE/BANDAGES/DRESSINGS) ×1 IMPLANT
DEVICE SUTURE ENDOST 10MM (ENDOMECHANICALS) ×3 IMPLANT
DRSG OPSITE POSTOP 3X4 (GAUZE/BANDAGES/DRESSINGS) ×9 IMPLANT
ELECT BLADE 6.5 EXT (BLADE) ×2 IMPLANT
ELECT REM PT RETURN 9FT ADLT (ELECTROSURGICAL) ×3
ELECTRODE REM PT RTRN 9FT ADLT (ELECTROSURGICAL) ×1 IMPLANT
GAUZE 4X4 16PLY RFD (DISPOSABLE) ×3 IMPLANT
GLOVE BIO SURGEON STRL SZ 6.5 (GLOVE) ×8 IMPLANT
GLOVE BIO SURGEON STRL SZ8 (GLOVE) ×3 IMPLANT
GLOVE BIO SURGEONS STRL SZ 6.5 (GLOVE) ×4
GLOVE INDICATOR 7.0 STRL GRN (GLOVE) ×3 IMPLANT
GOWN STRL REUS W/ TWL LRG LVL3 (GOWN DISPOSABLE) ×2 IMPLANT
GOWN STRL REUS W/TWL LRG LVL3 (GOWN DISPOSABLE) ×4
GOWN STRL REUS W/TWL XL LVL4 (GOWN DISPOSABLE) ×3 IMPLANT
GRASPER SUT TROCAR 14GX15 (MISCELLANEOUS) ×3 IMPLANT
HOLDER FOLEY CATH W/STRAP (MISCELLANEOUS) ×2 IMPLANT
ILLUMINATOR WAVEGUIDE N/F (MISCELLANEOUS) ×2 IMPLANT
IRRIGATION STRYKERFLOW (MISCELLANEOUS) ×1 IMPLANT
IRRIGATOR STRYKERFLOW (MISCELLANEOUS)
IV LACTATED RINGERS 1000ML (IV SOLUTION) ×1 IMPLANT
KIT PINK PAD W/HEAD ARE REST (MISCELLANEOUS) ×3
KIT PINK PAD W/HEAD ARM REST (MISCELLANEOUS) ×1 IMPLANT
KIT TURNOVER CYSTO (KITS) ×3 IMPLANT
LABEL OR SOLS (LABEL) ×3 IMPLANT
LIGASURE LAP MARYLAND 5MM 37CM (ELECTROSURGICAL) ×2 IMPLANT
MANIPULATOR VCARE LG CRV RETR (MISCELLANEOUS) IMPLANT
MANIPULATOR VCARE SML CRV RETR (MISCELLANEOUS) IMPLANT
MANIPULATOR VCARE STD CRV RETR (MISCELLANEOUS) IMPLANT
NS IRRIG 500ML POUR BTL (IV SOLUTION) ×3 IMPLANT
OCCLUDER COLPOPNEUMO (BALLOONS) ×3 IMPLANT
PACK GYN LAPAROSCOPIC (MISCELLANEOUS) ×3 IMPLANT
PAD OB MATERNITY 4.3X12.25 (PERSONAL CARE ITEMS) ×3 IMPLANT
PAD PREP 24X41 OB/GYN DISP (PERSONAL CARE ITEMS) ×3 IMPLANT
POUCH ENDO CATCH 10MM SPEC (MISCELLANEOUS) IMPLANT
POUCH SPECIMEN RETRIEVAL 10MM (ENDOMECHANICALS) IMPLANT
SCISSORS METZENBAUM CVD 33 (INSTRUMENTS) ×3 IMPLANT
SET CYSTO W/LG BORE CLAMP LF (SET/KITS/TRAYS/PACK) IMPLANT
SET TUBE SMOKE EVAC HIGH FLOW (TUBING) ×3 IMPLANT
SHEARS HARMONIC ACE PLUS 36CM (ENDOMECHANICALS) ×3 IMPLANT
SLEEVE ENDOPATH XCEL 5M (ENDOMECHANICALS) ×5 IMPLANT
STRIP CLOSURE SKIN 1/2X4 (GAUZE/BANDAGES/DRESSINGS) ×2 IMPLANT
SUT ENDO VLOC 180-0-8IN (SUTURE) ×3 IMPLANT
SUT VIC AB 0 CT1 18XCR BRD 8 (SUTURE) IMPLANT
SUT VIC AB 0 CT1 36 (SUTURE) ×4 IMPLANT
SUT VIC AB 0 CT1 8-18 (SUTURE) ×4
SUT VIC AB 0 CT2 27 (SUTURE) ×3 IMPLANT
SUT VIC AB 3-0 SH 27 (SUTURE)
SUT VIC AB 3-0 SH 27X BRD (SUTURE) IMPLANT
SUT VIC AB 4-0 FS2 27 (SUTURE) ×3 IMPLANT
SUT VIC AB 4-0 SH 27 (SUTURE) ×2
SUT VIC AB 4-0 SH 27XANBCTRL (SUTURE) IMPLANT
SUT VICRYL 0 AB UR-6 (SUTURE) ×3 IMPLANT
SYR 10ML LL (SYRINGE) ×3 IMPLANT
SYR 50ML LL SCALE MARK (SYRINGE) ×3 IMPLANT
TAPE TRANSPORE STRL 2 31045 (GAUZE/BANDAGES/DRESSINGS) ×2 IMPLANT
TROCAR ENDO BLADELESS 11MM (ENDOMECHANICALS) ×3 IMPLANT
TROCAR XCEL NON-BLD 5MMX100MML (ENDOMECHANICALS) ×3 IMPLANT
TROCAR XCEL UNIV SLVE 11M 100M (ENDOMECHANICALS) ×3 IMPLANT
TUBING EVAC SMOKE HEATED PNEUM (TUBING) ×3 IMPLANT

## 2019-09-09 NOTE — Anesthesia Procedure Notes (Signed)
Procedure Name: Intubation Date/Time: 09/09/2019 9:03 AM Performed by: Eben Burow, CRNA Pre-anesthesia Checklist: Patient identified, Emergency Drugs available, Suction available and Patient being monitored Patient Re-evaluated:Patient Re-evaluated prior to induction Oxygen Delivery Method: Circle system utilized Preoxygenation: Pre-oxygenation with 100% oxygen Induction Type: IV induction Ventilation: Mask ventilation without difficulty Laryngoscope Size: Mac and 3 Grade View: Grade II Tube type: Oral Tube size: 7.0 mm Number of attempts: 1 Airway Equipment and Method: Stylet Placement Confirmation: ETT inserted through vocal cords under direct vision,  positive ETCO2 and breath sounds checked- equal and bilateral Secured at: 21 cm Tube secured with: Tape Dental Injury: Teeth and Oropharynx as per pre-operative assessment

## 2019-09-09 NOTE — Interval H&P Note (Signed)
History and Physical Interval Note:  09/09/2019 8:45 AM  Mary Parsons  has presented today for surgery, with the diagnosis of pelvic pain, dysmenorrhea, irregular menses, hx of recurring left ovarian cyst.  The various methods of treatment have been discussed with the patient and family. After consideration of risks, benefits and other options for treatment, the patient has consented to  Procedure(s): LAPAROSCOPIC ASSISTED VAGINAL HYSTERECTOMY WITH BILATERAL SALPINGECTOMY (Bilateral), LEFT OOPHORECTOMY (Left) as a surgical intervention.  The patient's history has been reviewed, patient examined, no change in status, stable for surgery.  I have reviewed the patient's chart and labs.  Questions were answered to the patient's satisfaction.     Hildred Laser, MD [facility]

## 2019-09-09 NOTE — Anesthesia Preprocedure Evaluation (Signed)
Anesthesia Evaluation  Patient identified by MRN, date of birth, ID band Patient awake    Reviewed: Allergy & Precautions, NPO status , Patient's Chart, lab work & pertinent test results  History of Anesthesia Complications (+) PROLONGED EMERGENCE, Family history of anesthesia reaction and history of anesthetic complications  Airway Mallampati: I  TM Distance: >3 FB Neck ROM: Full    Dental  (+) Dental Advidsory Given, Teeth Intact Permanent retainer on the bottom:   Pulmonary neg pulmonary ROS, neg shortness of breath, neg sleep apnea, neg COPD, neg recent URI,    breath sounds clear to auscultation- rhonchi (-) wheezing      Cardiovascular Exercise Tolerance: Good (-) hypertension(-) CAD and (-) Past MI  Rhythm:Regular Rate:Normal - Systolic murmurs and - Diastolic murmurs    Neuro/Psych negative neurological ROS  negative psych ROS   GI/Hepatic negative GI ROS, Neg liver ROS,   Endo/Other  negative endocrine ROSneg diabetes  Renal/GU Renal disease (kidney stone)     Musculoskeletal negative musculoskeletal ROS (+)   Abdominal Gravid abdomen   Peds  Hematology negative hematology ROS (+)   Anesthesia Other Findings Past Medical History: No date: Anxiety     Comment:  NO MEDS No date: Complication of anesthesia     Comment:  HARD TIME WAKING UP No date: Family history of adverse reaction to anesthesia     Comment:  MOM AND SISTER-N/V No date: GERD (gastroesophageal reflux disease)     Comment:  OCC No date: Headache     Comment:  MIGRAINES No date: Heavy periods No date: History of kidney stones     Comment:  H/O   Reproductive/Obstetrics negative OB ROS                             Lab Results  Component Value Date   WBC 5.5 09/06/2019   HGB 13.3 09/06/2019   HCT 38.3 09/06/2019   MCV 87.2 09/06/2019   PLT 296 09/06/2019    Anesthesia Physical  Anesthesia Plan  ASA:  II  Anesthesia Plan: General   Post-op Pain Management:    Induction: Intravenous  PONV Risk Score and Plan: 3 and Ondansetron, Dexamethasone, Midazolam, Treatment may vary due to age or medical condition and Promethazine  Airway Management Planned: Oral ETT  Additional Equipment:   Intra-op Plan:   Post-operative Plan: Extubation in OR  Informed Consent: I have reviewed the patients History and Physical, chart, labs and discussed the procedure including the risks, benefits and alternatives for the proposed anesthesia with the patient or authorized representative who has indicated his/her understanding and acceptance.     Dental advisory given  Plan Discussed with: CRNA and Anesthesiologist  Anesthesia Plan Comments:         Anesthesia Quick Evaluation

## 2019-09-09 NOTE — Progress Notes (Signed)
   09/09/19 0750  Clinical Encounter Type  Visited With Family  Visit Type Initial  Referral From Chaplain  Consult/Referral To Chaplain  While rounding SDS  Waiting area, chaplain briefly spoke to patient's husband. Asked how he was doing and if he had any questions. Husband said  He was fine and chaplain wished him well.

## 2019-09-09 NOTE — Op Note (Signed)
Procedure(s): LAPAROSCOPIC ASSISTED VAGINAL HYSTERECTOMY WITH BILATERAL SALPINGECTOMY LEFT OOPHORECTOMY Procedure Note  Mary Parsons female 29 y.o. 09/09/2019  Indications: The patient is a 29 y.o. [id number] female with pelvic pain, dysmenorrhea, irregular menses, and recurrent left ovarian cyst.  Has history of C-section x 2, and tubal ligation.  Pre-operative Diagnosis: pelvic pain, dysmenorrhea, irregular menses, and recurrent left ovarian cyst  Post-operative Diagnosis: Same  Surgeon: Hildred Laser, MD  Assistants:  Brennan Bailey, MD. An experienced assistant was required given the standard of surgical care given the complexity of the case.  This assistant was needed for exposure, dissection, suctioning, retraction, instrument exchange, and for overall help during the procedure.  Anesthesia: General endotracheal anesthesia   Findings:  Normal appearing uterus, fallopian tubes, and ovaries.  Peritoneal window noted in posterior cul-de-sac. Adhesions of the bladder flap present.  Omental adhesion present to anterior abdominal wall.   Procedure Details: The patient was seen in the Holding Room. The risks, benefits, complications, treatment options, and expected outcomes were discussed with the patient.  The patient concurred with the proposed plan, giving informed consent.  The site of surgery properly noted/marked. The patient was taken to the Operating Room, identified as Mary Parsons and the procedure verified as Procedure(s) (LRB): LAPAROSCOPIC ASSISTED VAGINAL HYSTERECTOMY WITH BILATERAL SALPINGECTOMY (Bilateral), LEFT OOPHORECTOMY (Left). A Time Out was held and the above information confirmed.  She was then placed under general anesthesia without difficulty. She was placed in the dorsal lithotomy position, and was prepped and draped in a sterile manner.  A Foley catheter was inserted into her bladder, and attached to constant drainage. straight catheterization was performed. A  sterile speculum was inserted into the vagina and the cervix was grasped at the anterior lip using a single-toothed tenaculum.  A uterine manipulator was then advanced into the uterus .  Attention was turned to the abdomen where an umbilical incision was made with the scalpel.  The Optiview 5-mm trocar and sleeve were then advanced without difficulty with the laparoscope under direct visualization into the abdomen.  The abdomen was then insufflated with carbon dioxide gas and adequate pneumoperitoneum was obtained.  Bilateral 5-mm lower quadrant ports were then placed under direct visualization.  A survey of the patient's pelvis and abdomen revealed the findings as above.  Attention was turned to the infundibulopelvic ligament on the patient's left side, which was clamped using the Ligasure scalpel and ligated. The broad ligament was also ligated with the Harmonic scalpel working towards the round ligament.  The round and broad ligaments were then clamped and transected with the Harmonic scalpel.  The ureter were noted to be safely away from the area of dissection.  The uterine artery was then skeletonized and a bladder flap was created.  The bladder was then bluntly dissected off the lower uterine segment.  At this point, attention was turned to the left uterine vessels, which were clamped and ligated using the Harmonic scalpel.  Attention was then turned to the patient's right side, where the mesosalpinx was was clamped using the Ligasure scalpel and ligated.  The round ligament and the broad ligaments and the bladder flap creation was completed. The right uterine vessels were also clamped and transected in a similar fashion. The laparoscope was used to survey the operative site with good hemostasis noted.  No intraoperative injury to other surrounding organs was noted.  The abdomen was desufflated and all instruments were then removed from the patient's abdomen.   All skin incisions were closed with 4-0  Vicryl  and Dermabond. The incisions were injected with a total of 10 ml of 0.5% Sensorcaine. The decision was made to proceed with completing the hysterectomy via the vaginal route.  Attention was then turned to her pelvis.  A weighted speculum was then placed in the vagina, and the anterior and posterior lips of the cervix were grasped bilaterally with tenaculums.   The cervix was then circumferentially incised, and the posterior cul-de-sac was entered sharply without difficulty.  A long weighted speculum was inserted into the posterior cul-de-sac. The Heaney clamp was then used to clamp the uterosacral ligaments on either side.  They were then cut and sutured ligated with 0 Vicryl, and were held with a tag for later identification. Of note, all sutures used in this case were 0 Vicryl unless otherwise noted.   The cardinal ligaments were then clamped, cut and ligated bilaterally.  At this point, full entry into the anterior cul-de-sac was made, no injury to the bladder was noted.  The uterus, tubes and left ovary were freed from all ligaments and was then delivered and sent to pathology.  A modified Richardson stitch was performed at the vaginal cuff angles.  The vaginal cuff was then closed in a running locked fashion with 0 Vicryl with care given to incorporate the uterosacral pedicles bilaterally.  All instruments were then removed from the pelvis.   The patient tolerated the procedure well.  All instruments, needles, and sponge counts were correct  times two. The patient was taken to the recovery room awake, extubated and in stable condition.   Estimated Blood Loss:  300 ml      Drains: foley catheterization prior to procedure with 800 ml of clear urine at end of the case         Total IV Fluids:  900 ml  Specimens: Uterus, cervix, bilateral fallopian tubes, left ovary.         Implants: None         Complications:  None; patient tolerated the procedure well.         Disposition: PACU -  hemodynamically stable.         Condition: stable   Hildred Laser, MD [facility]

## 2019-09-09 NOTE — Anesthesia Postprocedure Evaluation (Signed)
Anesthesia Post Note  Patient: Mary Parsons  Procedure(s) Performed: LAPAROSCOPIC ASSISTED VAGINAL HYSTERECTOMY WITH BILATERAL SALPINGECTOMY (Left ) LAPAROSCOPIC UNILATERAL SALPINGO OOPHORECTOMY (Left )  Patient location during evaluation: PACU Anesthesia Type: General Level of consciousness: awake and alert Pain management: pain level controlled Vital Signs Assessment: post-procedure vital signs reviewed and stable Respiratory status: spontaneous breathing, nonlabored ventilation, respiratory function stable and patient connected to nasal cannula oxygen Cardiovascular status: blood pressure returned to baseline and stable Postop Assessment: no apparent nausea or vomiting Anesthetic complications: no   No complications documented.   Last Vitals:  Vitals:   09/09/19 1307 09/09/19 1328  BP: 123/68 135/70  Pulse: (!) 57 (!) 59  Resp: 12 16  Temp: 36.6 C 36.7 C  SpO2: 98% 96%    Last Pain:  Vitals:   09/09/19 1328  TempSrc: Oral  PainSc:                  Lenard Simmer

## 2019-09-09 NOTE — Transfer of Care (Signed)
Immediate Anesthesia Transfer of Care Note  Patient: Mary Parsons  Procedure(s) Performed: LAPAROSCOPIC ASSISTED VAGINAL HYSTERECTOMY WITH BILATERAL SALPINGECTOMY (Left ) LAPAROSCOPIC UNILATERAL SALPINGO OOPHORECTOMY (Left )  Patient Location: PACU  Anesthesia Type:General  Level of Consciousness: drowsy  Airway & Oxygen Therapy: Patient Spontanous Breathing and Patient connected to face mask oxygen  Post-op Assessment: Report given to RN and Post -op Vital signs reviewed and stable  Post vital signs: Reviewed and stable  Last Vitals:  Vitals Value Taken Time  BP 125/75 09/09/19 1128  Temp    Pulse 63 09/09/19 1131  Resp 22 09/09/19 1131  SpO2 100 % 09/09/19 1131  Vitals shown include unvalidated device data.  Last Pain:  Vitals:   09/09/19 0759  PainSc: 0-No pain         Complications: No complications documented.

## 2019-09-10 ENCOUNTER — Encounter: Payer: Self-pay | Admitting: Obstetrics and Gynecology

## 2019-09-10 ENCOUNTER — Encounter: Payer: BC Managed Care – PPO | Admitting: Obstetrics and Gynecology

## 2019-09-10 DIAGNOSIS — N946 Dysmenorrhea, unspecified: Secondary | ICD-10-CM | POA: Diagnosis not present

## 2019-09-10 LAB — CBC
HCT: 33.5 % — ABNORMAL LOW (ref 36.0–46.0)
Hemoglobin: 12.4 g/dL (ref 12.0–15.0)
MCH: 31.5 pg (ref 26.0–34.0)
MCHC: 37 g/dL — ABNORMAL HIGH (ref 30.0–36.0)
MCV: 85 fL (ref 80.0–100.0)
Platelets: 286 10*3/uL (ref 150–400)
RBC: 3.94 MIL/uL (ref 3.87–5.11)
RDW: 12 % (ref 11.5–15.5)
WBC: 9.9 10*3/uL (ref 4.0–10.5)
nRBC: 0 % (ref 0.0–0.2)

## 2019-09-10 MED ORDER — DOCUSATE SODIUM 100 MG PO CAPS
100.0000 mg | ORAL_CAPSULE | Freq: Every day | ORAL | 0 refills | Status: DC | PRN
Start: 2019-09-10 — End: 2019-09-18

## 2019-09-10 MED ORDER — SIMETHICONE 80 MG PO CHEW: 80.0000 mg | CHEWABLE_TABLET | Freq: Four times a day (QID) | ORAL | 0 refills | Status: DC | PRN

## 2019-09-10 MED ORDER — IBUPROFEN 800 MG PO TABS
800.0000 mg | ORAL_TABLET | Freq: Three times a day (TID) | ORAL | 1 refills | Status: DC | PRN
Start: 2019-09-10 — End: 2020-04-02

## 2019-09-10 NOTE — Progress Notes (Signed)
Discharge order received from doctor. Reviewed discharge instructions and prescriptions with patient and answered all questions. Follow up appointment given. Patient verbalized understanding. Patient discharged home with via ambulatory per patient request by nursing/auxillary.     Inge Rise, RN

## 2019-09-10 NOTE — Progress Notes (Signed)
Post-Operative Day # 1, s/p LAVH, bilateral salpingectomy and left oophorectomy  Subjective: no complaints, up ad lib, voiding, tolerating PO and + flatus  Objective: Temp:  [97.1 F (36.2 C)-98.4 F (36.9 C)] 98 F (36.7 C) (08/24 0820) Pulse Rate:  [54-88] 72 (08/24 0820) Resp:  [11-23] 18 (08/24 0820) BP: (113-138)/(56-80) 113/69 (08/24 0820) SpO2:  [92 %-100 %] 99 % (08/24 0820)  Physical Exam:  General: alert and no distress  Lungs: clear to auscultation bilaterally Heart: regular rate and rhythm, S1, S2 normal, no murmur, click, rub or gallop Abdomen: soft, non-tender; bowel sounds normal; no masses,  no organomegaly Pelvis:Bleeding: appropriate,  Incision: healing well, no significant drainage, no dehiscence, no significant erythema Extremities: DVT Evaluation: No evidence of DVT seen on physical exam. Negative Homan's sign. No cords or calf tenderness. No significant calf/ankle edema.  Recent Labs    09/10/19 0642  HGB 12.4  HCT 33.5*     Lab Results  Component Value Date   CREATININE 0.70 09/09/2019     Assessment/Plan: Doing well postoperatively.  Regular diet as tolerated  Encourage ambulation Can d/c home today.       Hildred Laser, MD [facility]

## 2019-09-10 NOTE — Discharge Summary (Signed)
Gynecology Physician Postoperative Discharge Summary  Patient ID: Mary Parsons MRN: [id number] DOB/AGE: 1990-10-11 29 y.o.  Admit Date: 09/09/2019 Discharge Date: 09/10/2019  Preoperative Diagnoses:  Dyspareunia in female Pelvic pain  Irregular menses Left ovarian cyst (recurrent)   Procedures: Procedure(s) (LRB): LAPAROSCOPIC ASSISTED VAGINAL HYSTERECTOMY WITH BILATERAL SALPINGECTOMY (Left) LAPAROSCOPIC UNILATERAL OOPHORECTOMY (Left)  Hospital Course:  Mary Parsons is a 29 y.o. [id number]  admitted for scheduled surgery.  She underwent the procedures as mentioned above, her operation was uncomplicated. For further details about surgery, please refer to the operative report. Patient had an uncomplicated postoperative course. By time of discharge on POD#1, her pain was controlled on oral pain medications; she was ambulating, voiding without difficulty, tolerating regular diet and passing flatus. She was deemed stable for discharge to home.   Significant Labs: CBC Latest Ref Rng & Units 09/10/2019 09/06/2019 04/25/2018  WBC 4.0 - 10.5 K/uL 9.9 5.5 6.9  Hemoglobin 12.0 - 15.0 g/dL 93.2 67.1 10.9(L)  Hematocrit 36 - 46 % 33.5(L) 38.3 31.2(L)  Platelets 150 - 400 K/uL 286 296 138(L)    Discharge Exam: Blood pressure 113/69, pulse 72, temperature 98 F (36.7 C), temperature source Oral, resp. rate 18, height 5\' 2"  (1.575 m), weight 78 kg, last menstrual period 08/25/2019, SpO2 99 %, not currently breastfeeding. General appearance: alert and no distress  Resp: clear to auscultation bilaterally  Cardio: regular rate and rhythm  GI: soft, non-tender; bowel sounds normal; no masses, no organomegaly.  Incision: C/D/I, no erythema, no drainage noted Pelvic: scant blood on pad  Extremities: extremities normal, atraumatic, no cyanosis or edema and Homans sign is negative, no sign of DVT  Discharged Condition: Stable  Disposition: Discharge disposition: 01-Home or Self  Care       Discharge Instructions    Discharge patient   Complete by: As directed    Discharge disposition: 01-Home or Self Care   Discharge patient date: 09/10/2019     Allergies as of 09/10/2019      Reactions   Adhesive [tape] Other (See Comments)   Burns skin off--tolerates PAPER TAPE ONLY      Medication List    TAKE these medications   calcium carbonate 500 MG chewable tablet Commonly known as: TUMS - dosed in mg elemental calcium Chew 1 tablet by mouth as needed for indigestion or heartburn.   docusate sodium 100 MG capsule Commonly known as: COLACE Take 1 capsule (100 mg total) by mouth daily as needed for mild constipation.   ibuprofen 800 MG tablet Commonly known as: ADVIL Take 1 tablet (800 mg total) by mouth every 8 (eight) hours as needed.   simethicone 80 MG chewable tablet Commonly known as: MYLICON Chew 1 tablet (80 mg total) by mouth 4 (four) times daily as needed for flatulence.   traMADol 50 MG tablet Commonly known as: ULTRAM TAKE 1 TABLET BY MOUTH EVERY SIX HOURS AS NEEDED FOR UP TO FOR FIVE DAYS FOR MODERATE OR SEVERE PAIN What changed: See the new instructions.      No future appointments.  Follow-up Information    09/12/2019, MD In 1 week.   Specialties: Obstetrics and Gynecology, Radiology Why: For wound re-check Contact information: [address] [place] [phone]               Signed:  [phone], MD [facility]

## 2019-09-11 LAB — SURGICAL PATHOLOGY

## 2019-09-18 ENCOUNTER — Encounter: Payer: Self-pay | Admitting: Obstetrics and Gynecology

## 2019-09-18 ENCOUNTER — Other Ambulatory Visit: Payer: Self-pay

## 2019-09-18 ENCOUNTER — Ambulatory Visit (INDEPENDENT_AMBULATORY_CARE_PROVIDER_SITE_OTHER): Payer: BC Managed Care – PPO | Admitting: Obstetrics and Gynecology

## 2019-09-18 VITALS — BP 123/76 | HR 92 | Ht 62.0 in | Wt 170.1 lb

## 2019-09-18 DIAGNOSIS — Z4889 Encounter for other specified surgical aftercare: Secondary | ICD-10-CM

## 2019-09-18 DIAGNOSIS — Z90710 Acquired absence of both cervix and uterus: Secondary | ICD-10-CM

## 2019-09-18 NOTE — Progress Notes (Signed)
    OBSTETRICS/GYNECOLOGY POST-OPERATIVE CLINIC VISIT  Subjective:     Mary Parsons is a 29 y.o. female who presents to the clinic 1 weeks status post laparoscopic assisted vaginal hysterectomy with bilateral salpingectomy and left oophorectomy for abnormal uterine bleeding, pelvic pain and recurrent left ovarian cyst. Eating a regular diet without difficulty. Bowel movements are normal. Pain is controlled with current analgesics. Medications being used: prescription NSAID's including ibuprofen (Motrin).   The following portions of the patient's history were reviewed and updated as appropriate: allergies, current medications, past family history, past medical history, past social history, past surgical history and problem list.  Review of Systems Pertinent items noted in HPI and remainder of comprehensive ROS otherwise negative.    Objective:    BP 123/76   Pulse 92   Ht 5\' 2"  (1.575 m)   Wt 170 lb 1.6 oz (77.2 kg)   LMP 08/25/2019 (Exact Date)   BMI 31.11 kg/m  General:  alert and no distress  Abdomen: soft, bowel sounds active, non-tender  Incision:   healing well, no drainage, no erythema, no hernia, no seroma, no swelling, no dehiscence, incision well approximated    Pathology:   DIAGNOSIS:  A. UTERUS WITH CERVIX BILATERAL FALLOPIAN TUBES AND LEFT OVARY; TOTAL  HYSTERECTOMY WITH BILATERAL SALPINGECTOMY AND LEFT OOPHORECTOMY:  - UTERINE CERVIX:    - BENIGN TRANSFORMATION ZONE.    - NEGATIVE FOR SQUAMOUS INTRAEPITHELIAL LESION AND MALIGNANCY.  - ENDOMETRIUM:    - SECRETORY PHASE ENDOMETRIUM.    - NEGATIVE FOR ATYPICAL HYPERPLASIA/EIN AND MALIGNANCY.  - MYOMETRIUM:    - NO SIGNIFICANT HISTOPATHOLOGIC CHANGE.  - FALLOPIAN TUBES:    - BENIGN PARATUBAL CYST.    - OTHERWISE NO SIGNIFICANT HISTOPATHOLOGIC CHANGE.  - LEFT OVARY:    - BENIGN PHYSIOLOGIC CHANGES INCLUDING MULTIPLE CYSTIC FOLLICLES.    - NEGATIVE FOR MALIGNANCY.   Assessment:     Doing well postoperatively.   Plan:   1. Continue any current medications. 2. Wound care discussed. 3. Operative findings again reviewed. Pathology report discussed. 4. Activity restrictions: no excessive bending, stooping, or squatting, no lifting more than 10-15 pounds and pelvic rest 5. Anticipated return to work: 1-2 weeks. 6. Follow up: 5 weeks for final post-operative visit    10/25/2019, MD [facility]

## 2019-09-18 NOTE — Progress Notes (Signed)
Pt present for incision check. Pt c/o of right side pain/fore to touch and cramping.

## 2019-10-23 ENCOUNTER — Other Ambulatory Visit: Payer: Self-pay

## 2019-10-23 ENCOUNTER — Ambulatory Visit (INDEPENDENT_AMBULATORY_CARE_PROVIDER_SITE_OTHER): Payer: BC Managed Care – PPO | Admitting: Obstetrics and Gynecology

## 2019-10-23 ENCOUNTER — Encounter: Payer: Self-pay | Admitting: Obstetrics and Gynecology

## 2019-10-23 VITALS — BP 111/74 | HR 84 | Ht 62.0 in | Wt 173.9 lb

## 2019-10-23 DIAGNOSIS — R4586 Emotional lability: Secondary | ICD-10-CM

## 2019-10-23 DIAGNOSIS — Z90710 Acquired absence of both cervix and uterus: Secondary | ICD-10-CM

## 2019-10-23 DIAGNOSIS — Z4889 Encounter for other specified surgical aftercare: Secondary | ICD-10-CM

## 2019-10-23 DIAGNOSIS — R232 Flushing: Secondary | ICD-10-CM

## 2019-10-23 MED ORDER — PAROXETINE HCL 10 MG PO TABS: 10.0000 mg | ORAL_TABLET | Freq: Every day | ORAL | 3 refills | Status: DC

## 2019-10-23 NOTE — Progress Notes (Signed)
    OBSTETRICS/GYNECOLOGY POST-OPERATIVE CLINIC VISIT  Subjective:     Mary Parsons is a 29 y.o. female who presents to the clinic 6 weeks status post laparoscopic assisted vaginal hysterectomy with bilateral salpingectomy and left oophorectomy for abnormal uterine bleeding, pelvic pain and recurrent left ovarian cyst. Eating a regular diet without difficulty. Bowel movements are normal. Pain is controlled with current analgesics. Medications being used: prescription NSAID's including ibuprofen (Motrin).   She complains of mood changes and increasing anxiety, hot flushes, and issues with sleep disturbances. Wonders if it is due to her history of anxiety or if it is due to her hormones. Notes she used to be on a medication to manage her anxiety and mood symptoms, but discontinued several months ago.    The following portions of the patient's history were reviewed and updated as appropriate: allergies, current medications, past family history, past medical history, past social history, past surgical history and problem list.  Review of Systems Pertinent items noted in HPI and remainder of comprehensive ROS otherwise negative.    Objective:    BP 111/74   Pulse 84   Ht 5\' 2"  (1.575 m)   Wt 173 lb 14.4 oz (78.9 kg)   LMP 08/25/2019 (Exact Date)   BMI 31.81 kg/m  General:  alert and no distress  Abdomen: soft, bowel sounds active, non-tender  Incision:   healing well, no drainage, no erythema, no hernia, no seroma, no swelling, no dehiscence, incision well approximated    Pathology:   DIAGNOSIS:  A. UTERUS WITH CERVIX BILATERAL FALLOPIAN TUBES AND LEFT OVARY; TOTAL  HYSTERECTOMY WITH BILATERAL SALPINGECTOMY AND LEFT OOPHORECTOMY:  - UTERINE CERVIX:    - BENIGN TRANSFORMATION ZONE.    - NEGATIVE FOR SQUAMOUS INTRAEPITHELIAL LESION AND MALIGNANCY.  - ENDOMETRIUM:    - SECRETORY PHASE ENDOMETRIUM.    - NEGATIVE FOR ATYPICAL HYPERPLASIA/EIN AND MALIGNANCY.  - MYOMETRIUM:     - NO SIGNIFICANT HISTOPATHOLOGIC CHANGE.  - FALLOPIAN TUBES:    - BENIGN PARATUBAL CYST.    - OTHERWISE NO SIGNIFICANT HISTOPATHOLOGIC CHANGE.  - LEFT OVARY:    - BENIGN PHYSIOLOGIC CHANGES INCLUDING MULTIPLE CYSTIC FOLLICLES.    - NEGATIVE FOR MALIGNANCY.   Assessment:   Post-operative state, s/p LAVH with bilateral salpingectomy and left oophorectomy Mood changes Sleep disturbances  Plan:   1. Operative findings again reviewed. Pathology report discussed. 2. Activity restrictions:none. Can resume all activities.  3.  Anticipated return to work: now. 4. Discussed symptoms. Offered option of starting birth control for 1-2 months to balance out hormones, or could resume mood stabilizing medication. Patient desires to restart medication for mood.  Has used Lexapro and Prozac in the past, however in light of mood changes, hot flushes, and sleep disturbances, she may benefit more from use of Paxil (also used for vasomotor symptoms in menopause).  Will start at 10 mg dosing..  5. Follow up: 1-2 months for annual exam with 10/25/2019, CNM. Can f/u with mood symptoms/vasomotor symptoms then.     Serafina Royals, MD [facility]

## 2019-10-23 NOTE — Progress Notes (Signed)
Pt present for postop visit. Pt c/o of left side pain, hot flashes, and issues with sleeping.

## 2019-11-01 ENCOUNTER — Encounter: Payer: BC Managed Care – PPO | Admitting: Obstetrics and Gynecology

## 2019-11-22 ENCOUNTER — Other Ambulatory Visit: Payer: Self-pay | Admitting: Certified Nurse Midwife

## 2019-11-22 DIAGNOSIS — Z6831 Body mass index (BMI) 31.0-31.9, adult: Secondary | ICD-10-CM

## 2019-11-22 MED ORDER — PHENTERMINE HCL 15 MG PO CAPS
ORAL_CAPSULE | ORAL | 0 refills | Status: DC
Start: 2019-11-22 — End: 2020-04-30

## 2019-11-22 MED ORDER — TOPIRAMATE 25 MG PO TABS: ORAL_TABLET | ORAL | 0 refills | Status: DC

## 2019-11-22 NOTE — Progress Notes (Signed)
Rx Phentermine and Topamax, see orders.    Serafina Royals, CNM [facility], [facility] 11/22/19 3:42 PM

## 2019-12-27 ENCOUNTER — Encounter: Payer: BC Managed Care – PPO | Admitting: Certified Nurse Midwife

## 2020-01-15 ENCOUNTER — Ambulatory Visit
Admission: EM | Admit: 2020-01-15 | Discharge: 2020-01-15 | Disposition: A | Discharge status: Home / Self Care | Payer: BC Managed Care – PPO | Attending: Family Medicine | Admitting: Family Medicine

## 2020-01-15 DIAGNOSIS — J029 Acute pharyngitis, unspecified: Secondary | ICD-10-CM | POA: Insufficient documentation

## 2020-01-15 LAB — POCT RAPID STREP A (OFFICE): Rapid Strep A Screen: NEGATIVE

## 2020-01-15 NOTE — Discharge Instructions (Addendum)
Strep test negative Sending for culture.  You can try doing some over-the-counter Zyrtec.  Warm salt water gargles to the throat. Follow up as needed for continued or worsening symptoms'

## 2020-01-15 NOTE — ED Triage Notes (Signed)
Pt reports having sore throat for "a few days". Painful when swallowing. No other symptoms. No known sick contacts.

## 2020-01-16 NOTE — ED Provider Notes (Signed)
Renaldo Fiddler    CSN: [id number] Arrival date & time: 01/15/20  0355      History   Chief Complaint Chief Complaint  Patient presents with  . Sore Throat    HPI Anastashia Westerfeld is a 29 y.o. female.   Patient is a 29 year old female presents today for sore throat times a few days.  Painful with swallowing.  No other associate symptoms.  No sick contacts.     Past Medical History:  Diagnosis Date  . Anxiety    NO MEDS  . Complication of anesthesia    HARD TIME WAKING UP  . Family history of adverse reaction to anesthesia    MOM AND SISTER-N/V  . GERD (gastroesophageal reflux disease)    OCC  . Headache    MIGRAINES  . Heavy periods   . History of kidney stones    H/O    Patient Active Problem List   Diagnosis Date Noted  . S/P laparoscopic hysterectomy 09/09/2019  . Left ovarian cyst 09/13/2018  . History of miscarriage 10/12/2017  . History of C-section 10/09/2017  . Pelvic pain 09/25/2016  . Dyspareunia in female 09/25/2016  . Family history of endometriosis 08/15/2016  . Eczema 11/11/2013    Past Surgical History:  Procedure Laterality Date  . CESAREAN SECTION    . CESAREAN SECTION WITH BILATERAL TUBAL LIGATION Bilateral 04/24/2018   Procedure: REPEAT CESAREAN SECTION WITH BILATERAL TUBAL LIGATION;  Surgeon: Hildred Laser, MD;  Location: [facility] ORS;  Service: Obstetrics;  Laterality: Bilateral;  TOB: 12:11 Sex: Female Weight: 7 lb 1 oz  . DILATION AND EVACUATION N/A 11/18/2016   Procedure: DILATATION AND EVACUATION;  Surgeon: Hildred Laser, MD;  Location: [facility] ORS;  Service: Gynecology;  Laterality: N/A;  . LAPAROSCOPIC UNILATERAL SALPINGO OOPHERECTOMY Left 09/09/2019   Procedure: LAPAROSCOPIC UNILATERAL SALPINGO OOPHORECTOMY;  Surgeon: Hildred Laser, MD;  Location: [facility] OR[facility];  Service: Gynecology;  Laterality: Left;  . LAPAROSCOPIC VAGINAL HYSTERECTOMY WITH SALPINGO OOPHORECTOMY Left 09/09/2019   Procedure: LAPAROSCOPIC ASSISTED VAGINAL HYSTERECTOMY  WITH BILATERAL SALPINGECTOMY;  Surgeon: Hildred Laser, MD;  Location: [facility] OR[facility];  Service: Gynecology;  Laterality: Left;  . LAPAROSCOPY N/A 11/18/2016   Procedure: LAPAROSCOPY DIAGNOSTIC;  Surgeon: Hildred Laser, MD;  Location: [facility] OR[facility];  Service: Gynecology;  Laterality: N/A;  . TONSILLECTOMY      OB History    Gravida  3   Para  2   Term  2   Preterm      AB  1   Living  2     SAB  1   IAB      Ectopic      Multiple  0   Live Births  2            Home Medications    Prior to Admission medications   Medication Sig Start Date End Date Taking? Authorizing Provider  ibuprofen (ADVIL) 800 MG tablet Take 1 tablet (800 mg total) by mouth every 8 (eight) hours as needed. 09/10/19   Hildred Laser, MD  PARoxetine (PAXIL) 10 MG tablet Take 1 tablet (10 mg total) by mouth daily. 10/23/19   Hildred Laser, MD  phentermine 15 MG capsule Take 1 capsule (15 mg total) by mouth every morning for 14 days, THEN 2 capsules (30 mg total) every morning for 16 days. 11/22/19 12/22/19  Gunnar Bulla, CNM  topiramate (TOPAMAX) 25 MG tablet Take 1 tablet (25 mg total) by mouth every evening for 14 days, THEN 2 tablets (50 mg  total) every evening for 16 days. 11/22/19 12/22/19  Gunnar Bulla, CNM    Family History Family History  Problem Relation Age of Onset  . Endometriosis Mother   . Endometriosis Maternal Aunt   . Endometriosis Maternal Grandmother   . Healthy Father   . Breast cancer Neg Hx   . Ovarian cancer Neg Hx   . Colon cancer Neg Hx     Social History Social History   Tobacco Use  . Smoking status: Never Smoker  . Smokeless tobacco: Never Used  Vaping Use  . Vaping Use: Never used  Substance Use Topics  . Alcohol use: Yes    Comment: occasional   . Drug use: No     Allergies   Adhesive [tape]   Review of Systems Review of Systems   Physical Exam Triage Vital Signs ED Triage Vitals  Enc Vitals Group     BP 01/15/20 0903 116/81      Pulse Rate 01/15/20 0903 90     Resp 01/15/20 0903 16     Temp 01/15/20 0903 98.6 F (37 C)     Temp Source 01/15/20 0903 Oral     SpO2 01/15/20 0903 97 %     Weight 01/15/20 0905 165 lb (74.8 kg)     Height 01/15/20 0905 5\' 2"  (1.575 m)     Head Circumference --      Peak Flow --      Pain Score 01/15/20 0905 7     Pain Loc --      Pain Edu? --      Excl. in GC? --    No data found.  Updated Vital Signs BP 116/81 (BP Location: Left Arm)   Pulse 90   Temp 98.6 F (37 C) (Oral)   Resp 16   Ht 5\' 2"  (1.575 m)   Wt 165 lb (74.8 kg)   LMP 08/25/2019 (Exact Date)   SpO2 97%   BMI 30.18 kg/m   Visual Acuity Right Eye Distance:   Left Eye Distance:   Bilateral Distance:    Right Eye Near:   Left Eye Near:    Bilateral Near:     Physical Exam Vitals and nursing note reviewed.  Constitutional:      General: She is not in acute distress.    Appearance: Normal appearance. She is not ill-appearing, toxic-appearing or diaphoretic.  HENT:     Head: Normocephalic.     Right Ear: Tympanic membrane and ear canal normal.     Left Ear: Tympanic membrane and ear canal normal.     Nose: Nose normal.     Mouth/Throat:     Pharynx: Oropharynx is clear.  Eyes:     Conjunctiva/sclera: Conjunctivae normal.  Pulmonary:     Effort: Pulmonary effort is normal.  Musculoskeletal:        General: Normal range of motion.     Cervical back: Normal range of motion.  Skin:    General: Skin is warm and dry.     Findings: No rash.  Neurological:     Mental Status: She is alert.  Psychiatric:        Mood and Affect: Mood normal.      UC Treatments / Results  Labs (all labs ordered are listed, but only abnormal results are displayed) Labs Reviewed  CULTURE, GROUP A STREP [facility])  POCT RAPID STREP A (OFFICE)    EKG   Radiology No results found.  Procedures Procedures (including critical care  time)  Medications Ordered in UC Medications - No data to display  Initial  Impression / Assessment and Plan / UC Course  I have reviewed the triage vital signs and the nursing notes.  Pertinent labs & imaging results that were available during my care of the patient were reviewed by me and considered in my medical decision making (see chart for details).     Sore throat Strep test negative.  Nothing concerning on exam.  Sending for culture.  Recommended over-the-counter medicines to include Zyrtec, warm salt water gargles Follow up as needed for continued or worsening symptoms   Final Clinical Impressions(s) / UC Diagnoses   Final diagnoses:  Sore throat     Discharge Instructions     Strep test negative Sending for culture.  You can try doing some over-the-counter Zyrtec.  Warm salt water gargles to the throat. Follow up as needed for continued or worsening symptoms'    ED Prescriptions    None     PDMP not reviewed this encounter.   Dahlia Byes A, NP 01/16/20 1254

## 2020-01-18 LAB — CULTURE, GROUP A STREP (THRC)

## 2020-01-28 MED ORDER — KETOROLAC TROMETHAMINE 10 MG PO TABS: 10.0000 mg | ORAL_TABLET | Freq: Four times a day (QID) | ORAL | 0 refills | Status: DC | PRN

## 2020-01-29 ENCOUNTER — Other Ambulatory Visit: Payer: Self-pay | Admitting: Obstetrics and Gynecology

## 2020-01-29 MED ORDER — TRAMADOL HCL 50 MG PO TABS: 50.0000 mg | ORAL_TABLET | Freq: Four times a day (QID) | ORAL | 0 refills | Status: AC | PRN

## 2020-01-29 NOTE — Progress Notes (Signed)
Patient notes Toradol is ineffective for her pain.  Will prescribe short supply of Ultram. Prescription sent to pharmacy.   Hildred Laser, MD [facility]

## 2020-04-02 ENCOUNTER — Encounter: Payer: Self-pay | Admitting: Certified Nurse Midwife

## 2020-04-02 ENCOUNTER — Other Ambulatory Visit: Payer: Self-pay

## 2020-04-02 ENCOUNTER — Ambulatory Visit (INDEPENDENT_AMBULATORY_CARE_PROVIDER_SITE_OTHER): Payer: BC Managed Care – PPO | Admitting: Certified Nurse Midwife

## 2020-04-02 VITALS — BP 128/82 | HR 69 | Resp 16 | Ht 61.0 in | Wt 175.1 lb

## 2020-04-02 DIAGNOSIS — G4700 Insomnia, unspecified: Secondary | ICD-10-CM

## 2020-04-02 DIAGNOSIS — F419 Anxiety disorder, unspecified: Secondary | ICD-10-CM

## 2020-04-02 DIAGNOSIS — Z8669 Personal history of other diseases of the nervous system and sense organs: Secondary | ICD-10-CM | POA: Diagnosis not present

## 2020-04-02 MED ORDER — RIZATRIPTAN BENZOATE 5 MG PO TBDP: 5.0000 mg | ORAL_TABLET | ORAL | 0 refills | Status: DC | PRN

## 2020-04-02 MED ORDER — PAROXETINE HCL 20 MG PO TABS: 20.0000 mg | ORAL_TABLET | Freq: Every day | ORAL | 1 refills | Status: DC

## 2020-04-02 MED ORDER — ALPRAZOLAM 0.5 MG PO TABS
0.5000 mg | ORAL_TABLET | Freq: Two times a day (BID) | ORAL | 1 refills | Status: DC | PRN
Start: 2020-04-02 — End: 2020-05-29

## 2020-04-02 NOTE — Patient Instructions (Signed)
Rizatriptan disintegrating tablets What is this medicine? RIZATRIPTAN (rye za TRIP tan) is used to treat migraines with or without aura. An aura is a strange feeling or visual disturbance that warns you of an attack. It is not used to prevent migraines. This medicine may be used for other purposes; ask your health care provider or pharmacist if you have questions. COMMON BRAND NAME(S): Maxalt-MLT What should I tell my health care provider before I take this medicine? They need to know if you have any of these conditions:  cigarette smoker  circulation problems in fingers and toes  diabetes  heart disease  high blood pressure  high cholesterol  history of irregular heartbeat  history of stroke  kidney disease  liver disease  stomach or intestine problems  an unusual or allergic reaction to rizatriptan, other medicines, foods, dyes, or preservatives  pregnant or trying to get pregnant  breast-feeding How should I use this medicine? Take this medicine by mouth. Follow the directions on the prescription label. Leave the tablet in the sealed blister pack until you are ready to take it. With dry hands, open the blister and gently remove the tablet. If the tablet breaks or crumbles, throw it away and take a new tablet out of the blister pack. Place the tablet in the mouth and allow it to dissolve, and then swallow. Do not cut, crush, or chew this medicine. You do not need water to take this medicine. Do not take it more often than directed. Talk to your pediatrician regarding the use of this medicine in children. While this drug may be prescribed for children as young as 6 years for selected conditions, precautions do apply. Overdosage: If you think you have taken too much of this medicine contact a poison control center or emergency room at once. NOTE: This medicine is only for you. Do not share this medicine with others. What if I miss a dose? This does not apply. This medicine is  not for regular use. What may interact with this medicine? Do not take this medicine with any of the following medicines:  certain medicines for migraine headache like almotriptan, eletriptan, frovatriptan, naratriptan, rizatriptan, sumatriptan, zolmitriptan  ergot alkaloids like dihydroergotamine, ergonovine, ergotamine, methylergonovine  MAOIs like Carbex, Eldepryl, Marplan, Nardil, and Parnate This medicine may also interact with the following medications:  certain medicines for depression, anxiety, or psychotic disorders  propranolol This list may not describe all possible interactions. Give your health care provider a list of all the medicines, herbs, non-prescription drugs, or dietary supplements you use. Also tell them if you smoke, drink alcohol, or use illegal drugs. Some items may interact with your medicine. What should I watch for while using this medicine? Visit your healthcare professional for regular checks on your progress. Tell your healthcare professional if your symptoms do not start to get better or if they get worse. You may get drowsy or dizzy. Do not drive, use machinery, or do anything that needs mental alertness until you know how this medicine affects you. Do not stand up or sit up quickly, especially if you are an older patient. This reduces the risk of dizzy or fainting spells. Alcohol may interfere with the effect of this medicine. Your mouth may get dry. Chewing sugarless gum or sucking hard candy and drinking plenty of water may help. Contact your healthcare professional if the problem does not go away or is severe. If you take migraine medicines for 10 or more days a month, your migraines may   get worse. Keep a diary of headache days and medicine use. Contact your healthcare professional if your migraine attacks occur more frequently. What side effects may I notice from receiving this medicine? Side effects that you should report to your doctor or health care  professional as soon as possible:  allergic reactions like skin rash, itching or hives, swelling of the face, lips, or tongue  chest pain or chest tightness  signs and symptoms of a dangerous change in heartbeat or heart rhythm like chest pain; dizziness; fast, irregular heartbeat; palpitations; feeling faint or lightheaded; falls; breathing problems  signs and symptoms of a stroke like changes in vision; confusion; trouble speaking or understanding; severe headaches; sudden numbness or weakness of the face, arm or leg; trouble walking; dizziness; loss of balance or coordination  signs and symptoms of serotonin syndrome like irritable; confusion; diarrhea; fast or irregular heartbeat; muscle twitching; stiff muscles; trouble walking; sweating; high fever; seizures; chills; vomiting Side effects that usually do not require medical attention (report to your doctor or health care professional if they continue or are bothersome):  diarrhea  dizziness  drowsiness  dry mouth  headache  nausea, vomiting  pain, tingling, numbness in the hands or feet  stomach pain This list may not describe all possible side effects. Call your doctor for medical advice about side effects. You may report side effects to FDA at 1-800-FDA-1088. Where should I keep my medicine? Keep out of the reach of children. Store at room temperature between 15 and 30 degrees C (59 and 86 degrees F). Protect from light and moisture. Throw away any unused medicine after the expiration date. NOTE: This sheet is a summary. It may not cover all possible information. If you have questions about this medicine, talk to your doctor, pharmacist, or health care provider.  2021 Elsevier/Gold Standard (2017-07-18 14:58:08)   Migraine Headache A migraine headache is an intense, throbbing pain on one side or both sides of the head. Migraine headaches may also cause other symptoms, such as nausea, vomiting, and sensitivity to light  and noise. A migraine headache can last from 4 hours to 3 days. Talk with your doctor about what things may bring on (trigger) your migraine headaches. What are the causes? The exact cause of this condition is not known. However, a migraine may be caused when nerves in the brain become irritated and release chemicals that cause inflammation of blood vessels. This inflammation causes pain. This condition may be triggered or caused by:  Drinking alcohol.  Smoking.  Taking medicines, such as: ? Medicine used to treat chest pain (nitroglycerin). ? Birth control pills. ? Estrogen. ? Certain blood pressure medicines.  Eating or drinking products that contain nitrates, glutamate, aspartame, or tyramine. Aged cheeses, chocolate, or caffeine may also be triggers.  Doing physical activity. Other things that may trigger a migraine headache include:  Menstruation.  Pregnancy.  Hunger.  Stress.  Lack of sleep or too much sleep.  Weather changes.  Fatigue. What increases the risk? The following factors may make you more likely to experience migraine headaches:  Being a certain age. This condition is more common in people who are 65-29 years old.  Being female.  Having a family history of migraine headaches.  Being Caucasian.  Having a mental health condition, such as depression or anxiety.  Being obese. What are the signs or symptoms? The main symptom of this condition is pulsating or throbbing pain. This pain may:  Happen in any area of the head,  such as on one side or both sides.  Interfere with daily activities.  Get worse with physical activity.  Get worse with exposure to bright lights or loud noises. Other symptoms may include:  Nausea.  Vomiting.  Dizziness.  General sensitivity to bright lights, loud noises, or smells. Before you get a migraine headache, you may get warning signs (an aura). An aura may include:  Seeing flashing lights or having blind  spots.  Seeing bright spots, halos, or zigzag lines.  Having tunnel vision or blurred vision.  Having numbness or a tingling feeling.  Having trouble talking.  Having muscle weakness. Some people have symptoms after a migraine headache (postdromal phase), such as:  Feeling tired.  Difficulty concentrating. How is this diagnosed? A migraine headache can be diagnosed based on:  Your symptoms.  A physical exam.  Tests, such as: ? CT scan or an MRI of the head. These imaging tests can help rule out other causes of headaches. ? Taking fluid from the spine (lumbar puncture) and analyzing it (cerebrospinal fluid analysis, or CSF analysis). How is this treated? This condition may be treated with medicines that:  Relieve pain.  Relieve nausea.  Prevent migraine headaches. Treatment for this condition may also include:  Acupuncture.  Lifestyle changes like avoiding foods that trigger migraine headaches.  Biofeedback.  Cognitive behavioral therapy. Follow these instructions at home: Medicines  Take over-the-counter and prescription medicines only as told by your health care provider.  Ask your health care provider if the medicine prescribed to you: ? Requires you to avoid driving or using heavy machinery. ? Can cause constipation. You may need to take these actions to prevent or treat constipation:  Drink enough fluid to keep your urine pale yellow.  Take over-the-counter or prescription medicines.  Eat foods that are high in fiber, such as beans, whole grains, and fresh fruits and vegetables.  Limit foods that are high in fat and processed sugars, such as fried or sweet foods. Lifestyle  Do not drink alcohol.  Do not use any products that contain nicotine or tobacco, such as cigarettes, e-cigarettes, and chewing tobacco. If you need help quitting, ask your health care provider.  Get at least 8 hours of sleep every night.  Find ways to manage stress, such as  meditation, deep breathing, or yoga. General instructions  Keep a journal to find out what may trigger your migraine headaches. For example, write down: ? What you eat and drink. ? How much sleep you get. ? Any change to your diet or medicines.  If you have a migraine headache: ? Avoid things that make your symptoms worse, such as bright lights. ? It may help to lie down in a dark, quiet room. ? Do not drive or use heavy machinery. ? Ask your health care provider what activities are safe for you while you are experiencing symptoms.  Keep all follow-up visits as told by your health care provider. This is important.      Contact a health care provider if:  You develop symptoms that are different or more severe than your usual migraine headache symptoms.  You have more than 15 headache days in one month. Get help right away if:  Your migraine headache becomes severe.  Your migraine headache lasts longer than 72 hours.  You have a fever.  You have a stiff neck.  You have vision loss.  Your muscles feel weak or like you cannot control them.  You start to lose your balance  often.  You have trouble walking.  You faint.  You have a seizure. Summary  A migraine headache is an intense, throbbing pain on one side or both sides of the head. Migraines may also cause other symptoms, such as nausea, vomiting, and sensitivity to light and noise.  This condition may be treated with medicines and lifestyle changes. You may also need to avoid certain things that trigger a migraine headache.  Keep a journal to find out what may trigger your migraine headaches.  Contact your health care provider if you have more than 15 headache days in a month or you develop symptoms that are different or more severe than your usual migraine headache symptoms. This information is not intended to replace advice given to you by your health care provider. Make sure you discuss any questions you have with  your health care provider. Document Revised: 04/27/2018 Document Reviewed: 02/15/2018 Elsevier Patient Education  2021 [employer] Inc.   Alprazolam tablets What is this medicine? ALPRAZOLAM (al PRAY zoe lam) is a benzodiazepine. It is used to treat anxiety and panic attacks. This medicine may be used for other purposes; ask your health care provider or pharmacist if you have questions. COMMON BRAND NAME(S): Xanax What should I tell my health care provider before I take this medicine? They need to know if you have any of these conditions:  depression or other mental health disease  history of alcohol or medicine abuse or addiction  kidney disease  liver disease  lung disease, asthma, or breathing problem  seizures  suicidal thoughts, plans or attempt  an unusual or allergic reaction to alprazolam, other benzodiazepines, foods, dyes, or preservatives  pregnant or trying to get pregnant  breast-feeding How should I use this medicine? Take this medicine by mouth. Take it as directed on the prescription label. Do not take it more often than directed. Keep taking it unless your health care provider tells you to stop. A special MedGuide will be given to you by the pharmacist with each prescription and refill. Be sure to read this information carefully each time. Talk to your health care provider about the use of this medicine in children. Special care may be needed. Patients over 10 years of age may have a stronger reaction and need a smaller dose. Overdosage: If you think you have taken too much of this medicine contact a poison control center or emergency room at once. NOTE: This medicine is only for you. Do not share this medicine with others. What if I miss a dose? If you miss a dose, take it as soon as you can. If it is almost time for your next dose, take only that dose. Do not take double or extra doses. What may interact with this medicine? Do not take this medicine with any  of the following medications:  certain antivirals for HIV or hepatitis  certain medicines for fungal infections like ketoconazole, itraconazole, or posaconazole  clarithromycin  grapefruit juice  narcotic medicines for cough  sodium oxybate This medicine may also interact with the following medications:  alcohol  antihistamines for allergy, cough and cold  certain medicines for anxiety or sleep  certain medicines for depression like amitriptyline, fluoxetine, fluvoxamine, nefazodone, sertraline  certain medicines for seizures like carbamazepine, phenobarbital, phenytoin, primidone  cimetidine  digoxin  erythromycin  female hormones, like estrogens or progestins and birth control pills, patches, rings, or injections  general anesthetics like halothane, isoflurane, methoxyflurane, propofol  medicines that relax muscles  narcotic medicines for  pain  phenothiazines like chlorpromazine, mesoridazine, prochlorperazine, thioridazine This list may not describe all possible interactions. Give your health care provider a list of all the medicines, herbs, non-prescription drugs, or dietary supplements you use. Also tell them if you smoke, drink alcohol, or use illegal drugs. Some items may interact with your medicine. What should I watch for while using this medicine? Visit your health care provider for regular checks on your progress. Tell your health care provider if your symptoms do not start to get better or if they get worse. Do not stop taking except on your doctor's advice. You may develop a severe reaction. Your doctor will tell you how much medicine to take. You may get drowsy or dizzy. Do not drive, use machinery, or do anything that needs mental alertness until you know how this medicine affects you. To reduce the risk of dizzy and fainting spells, do not stand or sit up quickly, especially if you are an older patient. Alcohol may increase dizziness and drowsiness. Avoid  alcoholic drinks. If you are taking another medicine that also causes drowsiness, you may have more side effects. Give your health care provider a list of all medicines you use. Your doctor will tell you how much medicine to take. Do not take more medicine than directed. Call emergency for help if you have problems breathing or unusual sleepiness. Women should inform their health care provider if they wish to become pregnant or think they might be pregnant. Do not breast-feed while taking this medicine. Talk to your health care provider for more information. What side effects may I notice from receiving this medicine? Side effects that you should report to your doctor or health care professional as soon as possible:  allergic reactions like skin rash, itching or hives, swelling of the face, lips, or tongue  confusion  loss of balance or coordination  signs and symptoms of low blood pressure like dizziness; feeling faint or lightheaded, falls; unusually weak or tired  suicidal thoughts or other mood changes  trouble breathing  trouble saying things clearly Side effects that usually do not require medical attention (report to your doctor or health care professional if they continue or are bothersome):  changes in sex drive  drowsiness  dry mouth  tiredness This list may not describe all possible side effects. Call your doctor for medical advice about side effects. You may report side effects to FDA at 1-800-FDA-1088. Where should I keep my medicine? Keep out of the reach of children and pets. This medicine can be abused. Keep it in a safe place to protect it from theft. Do not share it with anyone. It is only for you. Selling or giving away this medicine is dangerous and against the law. Store at room temperature between 20 and 25 degrees C (68 and 77 degrees F). Get rid of any unused medicine after the expiration date. This medicine may cause harm and death if it is taken by other  adults, children, or pets. It is important to get rid of the medicine as soon as you no longer need it or it is expired. You can do this in two ways:  Take the medicine to a medicine take-back program. Check with your pharmacy or law enforcement to find a location.  If you cannot return the medicine, check the label or package insert to see if the medicine should be thrown out in the garbage or flushed down the toilet. If you are not sure, ask your health care  provider. If it is safe to put it in the trash, take the medicine out of the container. Mix the medicine with cat litter, dirt, coffee grounds, or other unwanted substance. Seal the mixture in a bag or container. Put it in the trash. NOTE: This sheet is a summary. It may not cover all possible information. If you have questions about this medicine, talk to your doctor, pharmacist, or health care provider.  2021 Elsevier/Gold Standard (2019-07-08 15:56:13)   Paroxetine Tablets What is this medicine? PAROXETINE (pa ROX e teen) is used to treat depression. It may also be used to treat anxiety disorders, obsessive compulsive disorder, panic attacks, post traumatic stress, and premenstrual dysphoric disorder (PMDD). This medicine may be used for other purposes; ask your health care provider or pharmacist if you have questions. COMMON BRAND NAME(S): Paxil, Pexeva What should I tell my health care provider before I take this medicine? They need to know if you have any of these conditions:  bipolar disorder or a family history of bipolar disorder  bleeding disorders  glaucoma  heart disease  kidney disease  liver disease  low levels of sodium in the blood  seizures  suicidal thoughts, plans, or attempt; a previous suicide attempt by you or a family member  take MAOIs like Carbex, Eldepryl, Marplan, Nardil, and Parnate  take medicines that treat or prevent blood clots  thyroid disease  an unusual or allergic reaction to  paroxetine, other medicines, foods, dyes, or preservatives  pregnant or trying to get pregnant  breast-feeding How should I use this medicine? Take this medicine by mouth with a glass of water. Follow the directions on the prescription label. You can take it with or without food. Take your medicine at regular intervals. Do not take your medicine more often than directed. Do not stop taking this medicine suddenly except upon the advice of your doctor. Stopping this medicine too quickly may cause serious side effects or your condition may worsen. A special MedGuide will be given to you by the pharmacist with each prescription and refill. Be sure to read this information carefully each time. Talk to your pediatrician regarding the use of this medicine in children. Special care may be needed. Overdosage: If you think you have taken too much of this medicine contact a poison control center or emergency room at once. NOTE: This medicine is only for you. Do not share this medicine with others. What if I miss a dose? If you miss a dose, take it as soon as you can. If it is almost time for your next dose, take only that dose. Do not take double or extra doses. What may interact with this medicine? Do not take this medicine with any of the following medications:  linezolid  MAOIs like Carbex, Eldepryl, Marplan, Nardil, and Parnate  methylene blue (injected into a vein)  pimozide  thioridazine This medicine may also interact with the following medications:  alcohol  amphetamines  aspirin and aspirin-like medicines  atomoxetine  certain medicines for depression, anxiety, or psychotic disturbances  certain medicines for irregular heart beat like propafenone, flecainide, encainide, and quinidine  certain medicines for migraine headache like almotriptan, eletriptan, frovatriptan, naratriptan, rizatriptan, sumatriptan,  zolmitriptan  cimetidine  digoxin  diuretics  fentanyl  fosamprenavir  furazolidone  isoniazid  lithium  medicines that treat or prevent blood clots like warfarin, enoxaparin, and dalteparin  medicines for sleep  NSAIDs, medicines for pain and inflammation, like ibuprofen or naproxen  phenobarbital  phenytoin  procarbazine  rasagiline  ritonavir  supplements like St. John's wort, kava kava, valerian  tamoxifen  tramadol  tryptophan This list may not describe all possible interactions. Give your health care provider a list of all the medicines, herbs, non-prescription drugs, or dietary supplements you use. Also tell them if you smoke, drink alcohol, or use illegal drugs. Some items may interact with your medicine. What should I watch for while using this medicine? Tell your doctor if your symptoms do not get better or if they get worse. Visit your doctor or health care professional for regular checks on your progress. Because it may take several weeks to see the full effects of this medicine, it is important to continue your treatment as prescribed by your doctor. Patients and their families should watch out for new or worsening thoughts of suicide or depression. Also watch out for sudden changes in feelings such as feeling anxious, agitated, panicky, irritable, hostile, aggressive, impulsive, severely restless, overly excited and hyperactive, or not being able to sleep. If this happens, especially at the beginning of treatment or after a change in dose, call your health care professional. Bonita Quin may get drowsy or dizzy. Do not drive, use machinery, or do anything that needs mental alertness until you know how this medicine affects you. Do not stand or sit up quickly, especially if you are an older patient. This reduces the risk of dizzy or fainting spells. Alcohol may interfere with the effect of this medicine. Avoid alcoholic drinks. Your mouth may get dry. Chewing  sugarless gum or sucking hard candy, and drinking plenty of water will help. Contact your doctor if the problem does not go away or is severe. What side effects may I notice from receiving this medicine? Side effects that you should report to your doctor or health care professional as soon as possible:  allergic reactions like skin rash, itching or hives, swelling of the face, lips, or tongue  anxious  black, tarry stools  changes in vision  confusion  elevated mood, decreased need for sleep, racing thoughts, impulsive behavior  eye pain  fast, irregular heartbeat  feeling faint or lightheaded, falls  feeling agitated, angry, or irritable  hallucination, loss of contact with reality  loss of balance or coordination  loss of memory  painful or prolonged erections  restlessness, pacing, inability to keep still  seizures  stiff muscles  suicidal thoughts or other mood changes  trouble sleeping  unusual bleeding or bruising  unusually weak or tired  vomiting Side effects that usually do not require medical attention (report to your doctor or health care professional if they continue or are bothersome):  change in appetite or weight  change in sex drive or performance  diarrhea  dizziness  dry mouth  increased sweating  indigestion, nausea  tired  tremors This list may not describe all possible side effects. Call your doctor for medical advice about side effects. You may report side effects to FDA at 1-800-FDA-1088. Where should I keep my medicine? Keep out of the reach of children. Store at room temperature between 15 and 30 degrees C (59 and 86 degrees F). Keep container tightly closed. Throw away any unused medicine after the expiration date. NOTE: This sheet is a summary. It may not cover all possible information. If you have questions about this medicine, talk to your doctor, pharmacist, or health care provider.  2021 Elsevier/Gold Standard  (2019-11-25 14:25:05)

## 2020-04-02 NOTE — Progress Notes (Signed)
GYN ENCOUNTER NOTE  Subjective:       Mary Parsons is a 30 y.o. [phone] female here for evaluation of increased anxiety, insomnia, and migraines headaches over the last few weeks.   Work and family life are stressful. Currently taking 10 mg Paxil daily, feels like it "is no longer helping".   No SI/HI.    Gynecologic History  Patient's last menstrual period was 08/25/2019 (exact date).  Contraception: tubal ligation  Last Pap: Up to date  Obstetric History  OB History  Gravida Para Term Preterm AB Living  3 2 2   1 2   SAB IAB Ectopic Multiple Live Births  1     0 2    # Outcome Date GA Lbr Len/2nd Weight Sex Delivery Anes PTL Lv  3 Term 04/24/18 [redacted]w[redacted]d  7 lb 0.9 oz (3.2 kg) M CS-LTranv Spinal  LIV  2 SAB 11/18/16 [redacted]w[redacted]d         1 Term 08/21/15 [redacted]w[redacted]d  4 lb 2.2 oz (1.878 kg) M CS-Unspec   LIV     Complications: Fetal Intolerance    Past Medical History:  Diagnosis Date  . Anxiety    NO MEDS  . Complication of anesthesia    HARD TIME WAKING UP  . Family history of adverse reaction to anesthesia    MOM AND SISTER-N/V  . GERD (gastroesophageal reflux disease)    OCC  . Headache    MIGRAINES  . Heavy periods   . History of kidney stones    H/O    Past Surgical History:  Procedure Laterality Date  . CESAREAN SECTION    . CESAREAN SECTION WITH BILATERAL TUBAL LIGATION Bilateral 04/24/2018   Procedure: REPEAT CESAREAN SECTION WITH BILATERAL TUBAL LIGATION;  Surgeon: 06/24/2018, MD;  Location: [facility];  Service: Obstetrics;  Laterality: Bilateral;  TOB: 12:11 Sex: Female Weight: 7 lb 1 oz  . DILATION AND EVACUATION N/A 11/18/2016   Procedure: DILATATION AND EVACUATION;  Surgeon: 13/02/2016, MD;  Location: [facility];  Service: Gynecology;  Laterality: N/A;  . LAPAROSCOPIC UNILATERAL SALPINGO OOPHERECTOMY Left 09/09/2019   Procedure: LAPAROSCOPIC UNILATERAL SALPINGO OOPHORECTOMY;  Surgeon: 09/11/2019, MD;  Location: [facility];  Service: Gynecology;  Laterality:  Left;  . LAPAROSCOPIC VAGINAL HYSTERECTOMY WITH SALPINGO OOPHORECTOMY Left 09/09/2019   Procedure: LAPAROSCOPIC ASSISTED VAGINAL HYSTERECTOMY WITH BILATERAL SALPINGECTOMY;  Surgeon: 09/11/2019, MD;  Location: [facility];  Service: Gynecology;  Laterality: Left;  . LAPAROSCOPY N/A 11/18/2016   Procedure: LAPAROSCOPY DIAGNOSTIC;  Surgeon: 13/02/2016, MD;  Location: [facility];  Service: Gynecology;  Laterality: N/A;  . TONSILLECTOMY      Current Outpatient Medications on File Prior to Visit  Medication Sig Dispense Refill  . phentermine 15 MG capsule Take 1 capsule (15 mg total) by mouth every morning for 14 days, THEN 2 capsules (30 mg total) every morning for 16 days. 46 capsule 0  . topiramate (TOPAMAX) 25 MG tablet Take 1 tablet (25 mg total) by mouth every evening for 14 days, THEN 2 tablets (50 mg total) every evening for 16 days. 46 tablet 0   No current facility-administered medications on file prior to visit.    Allergies  Allergen Reactions  . Adhesive [Tape] Other (See Comments)    Burns skin off--tolerates PAPER TAPE ONLY    Social History   Socioeconomic History  . Marital status: Married    Spouse name: Not on file  . Number of children: Not on file  . Years of education:  Not on file  . Highest education level: Not on file  Occupational History  . Not on file  Tobacco Use  . Smoking status: Never Smoker  . Smokeless tobacco: Never Used  Vaping Use  . Vaping Use: Never used  Substance and Sexual Activity  . Alcohol use: Yes    Comment: occasional   . Drug use: No  . Sexual activity: Yes    Birth control/protection: Surgical    Comment: Tubal ligation  Other Topics Concern  . Not on file  Social History Narrative  . Not on file   Social Determinants of Health   Financial Resource Strain: Not on file  Food Insecurity: Not on file  Transportation Needs: Not on file  Physical Activity: Not on file  Stress: Not on file  Social Connections: Not on file   Intimate Partner Violence: Not on file    Family History  Problem Relation Age of Onset  . Endometriosis Mother   . Endometriosis Maternal Aunt   . Endometriosis Maternal Grandmother   . Healthy Father   . Breast cancer Neg Hx   . Ovarian cancer Neg Hx   . Colon cancer Neg Hx     The following portions of the patient's history were reviewed and updated as appropriate: allergies, current medications, past family history, past medical history, past social history, past surgical history and problem list.  Review of Systems  ROS negative except as noted. Information obtained from patient.   Objective:   BP 128/82   Pulse 69   Resp 16   Ht 5\' 1"  (1.549 m)   Wt 175 lb 1.6 oz (79.4 kg)   LMP 08/25/2019 (Exact Date)   BMI 33.08 kg/m    CONSTITUTIONAL: Well-developed, well-nourished female in no acute distress.   PHYSICAL EXAM: Not indicated.   Depression screen [facility] 2/9 04/02/2020 03/22/2019 10/11/2018 08/07/2018 06/07/2018  Decreased Interest 1 0 0 0 0  Down, Depressed, Hopeless 1 0 1 0 0  PHQ - 2 Score 2 0 1 0 0  Altered sleeping 3 1 2 1 1   Tired, decreased energy 2 1 3 1 1   Change in appetite 1 0 0 0 0  Feeling bad or failure about yourself  1 1 1  0 0  Trouble concentrating 2 1 1 1  0  Moving slowly or fidgety/restless 1 1 0 0 0  Suicidal thoughts 0 0 0 0 0  PHQ-9 Score 12 5 8 3 2   Difficult doing work/chores Very difficult Somewhat difficult Somewhat difficult Somewhat difficult Not difficult at all   GAD 7 : Generalized Anxiety Score 04/02/2020 06/20/2019 03/22/2019 10/11/2018  Nervous, Anxious, on Edge 2 1 2 1   Control/stop worrying 2 0 1 3  Worry too much - different things 2 0 1 3  Trouble relaxing 3 1 2 3   Restless 3 2 2 3   Easily annoyed or irritable 3 1 3 3   Afraid - awful might happen 1 0 0 0  Total GAD 7 Score 16 5 11 16   Anxiety Difficulty Very difficult Somewhat difficult Somewhat difficult Somewhat difficult    Assessment:   1. Anxiety   2. Insomnia,  unspecified type   3. Hx of migraines   Plan:   Patient agrees to increase Paxil to 20 mg PO daily.   Rx Paxil, Xanax, and Maxalt, see orders.   Reviewed red flag symptoms and when to call.   RTC x 4 weeks for TELEVISIT MOOD check or sooner if needed.  Serafina Royals, CNM [facility], [facility] 04/02/20 5:04 PM

## 2020-04-30 ENCOUNTER — Ambulatory Visit (INDEPENDENT_AMBULATORY_CARE_PROVIDER_SITE_OTHER): Payer: BC Managed Care – PPO | Admitting: Certified Nurse Midwife

## 2020-04-30 ENCOUNTER — Other Ambulatory Visit: Payer: Self-pay

## 2020-04-30 ENCOUNTER — Encounter: Payer: Self-pay | Admitting: Certified Nurse Midwife

## 2020-04-30 DIAGNOSIS — F419 Anxiety disorder, unspecified: Secondary | ICD-10-CM | POA: Diagnosis not present

## 2020-04-30 NOTE — Patient Instructions (Signed)
[email or web address]">  Generalized Anxiety Disorder, Adult Generalized anxiety disorder (GAD) is a mental health condition. Unlike normal worries, anxiety related to GAD is not triggered by a specific event. These worries do not fade or get better with time. GAD interferes with relationships, work, and school. GAD symptoms can vary from mild to severe. People with severe GAD can have intense waves of anxiety with physical symptoms that are similar to panic attacks. What are the causes? The exact cause of GAD is not known, but the following are believed to have an impact:  Differences in natural brain chemicals.  Genes passed down from parents to children.  Differences in the way threats are perceived.  Development during childhood.  Personality. What increases the risk? The following factors may make you more likely to develop this condition:  Being female.  Having a family history of anxiety disorders.  Being very shy.  Experiencing very stressful life events, such as the death of a loved one.  Having a very stressful family environment. What are the signs or symptoms? People with GAD often worry excessively about many things in their lives, such as their health and family. Symptoms may also include:  Mental and emotional symptoms: ? Worrying excessively about natural disasters. ? Fear of being late. ? Difficulty concentrating. ? Fears that others are judging your performance.  Physical symptoms: ? Fatigue. ? Headaches, muscle tension, muscle twitches, trembling, or feeling shaky. ? Feeling like your heart is pounding or beating very fast. ? Feeling out of breath or like you cannot take a deep breath. ? Having trouble falling asleep or staying asleep, or experiencing restlessness. ? Sweating. ? Nausea, diarrhea, or irritable bowel syndrome (IBS).  Behavioral symptoms: ? Experiencing erratic moods or irritability. ? Avoidance of new situations. ? Avoidance of  people. ? Extreme difficulty making decisions. How is this diagnosed? This condition is diagnosed based on your symptoms and medical history. You will also have a physical exam. Your health care provider may perform tests to rule out other possible causes of your symptoms. To be diagnosed with GAD, a person must have anxiety that:  Is out of his or her control.  Affects several different aspects of his or her life, such as work and relationships.  Causes distress that makes him or her unable to take part in normal activities.  Includes at least three symptoms of GAD, such as restlessness, fatigue, trouble concentrating, irritability, muscle tension, or sleep problems. Before your health care provider can confirm a diagnosis of GAD, these symptoms must be present more days than they are not, and they must last for 6 months or longer. How is this treated? This condition may be treated with:  Medicine. Antidepressant medicine is usually prescribed for long-term daily control. Anti-anxiety medicines may be added in severe cases, especially when panic attacks occur.  Talk therapy (psychotherapy). Certain types of talk therapy can be helpful in treating GAD by providing support, education, and guidance. Options include: ? Cognitive behavioral therapy (CBT). People learn coping skills and self-calming techniques to ease their physical symptoms. They learn to identify unrealistic thoughts and behaviors and to replace them with more appropriate thoughts and behaviors. ? Acceptance and commitment therapy (ACT). This treatment teaches people how to be mindful as a way to cope with unwanted thoughts and feelings. ? Biofeedback. This process trains you to manage your body's response (physiological response) through breathing techniques and relaxation methods. You will work with a therapist while machines are used to monitor your physical   symptoms.  Stress management techniques. These include yoga,  meditation, and exercise. A mental health specialist can help determine which treatment is best for you. Some people see improvement with one type of therapy. However, other people require a combination of therapies.   Follow these instructions at home: Lifestyle  Maintain a consistent routine and schedule.  Anticipate stressful situations. Create a plan, and allow extra time to work with your plan.  Practice stress management or self-calming techniques that you have learned from your therapist or your health care provider. General instructions  Take over-the-counter and prescription medicines only as told by your health care provider.  Understand that you are likely to have setbacks. Accept this and be kind to yourself as you persist to take better care of yourself.  Recognize and accept your accomplishments, even if you judge them as small.  Keep all follow-up visits as told by your health care provider. This is important. Contact a health care provider if:  Your symptoms do not get better.  Your symptoms get worse.  You have signs of depression, such as: ? A persistently sad or irritable mood. ? Loss of enjoyment in activities that used to bring you joy. ? Change in weight or eating. ? Changes in sleeping habits. ? Avoiding friends or family members. ? Loss of energy for normal tasks. ? Feelings of guilt or worthlessness. Get help right away if:  You have serious thoughts about hurting yourself or others. If you ever feel like you may hurt yourself or others, or have thoughts about taking your own life, get help right away. Go to your nearest emergency department or:  Call your local emergency services (911 in the [place]  Call a suicide crisis helpline, such as the National Suicide Prevention Lifeline at 867-064-99291-772-563-8505. This is open 24 hours a day in the [place]  Text the Crisis Text Line at 915-382-0674741741 (in the [place] Summary  Generalized anxiety disorder (GAD) is a mental  health condition that involves worry that is not triggered by a specific event.  People with GAD often worry excessively about many things in their lives, such as their health and family.  GAD may cause symptoms such as restlessness, trouble concentrating, sleep problems, frequent sweating, nausea, diarrhea, headaches, and trembling or muscle twitching.  A mental health specialist can help determine which treatment is best for you. Some people see improvement with one type of therapy. However, other people require a combination of therapies. This information is not intended to replace advice given to you by your health care provider. Make sure you discuss any questions you have with your health care provider. Document Revised: 10/24/2018 Document Reviewed: 10/24/2018 Elsevier Patient Education  2021 Elsevier Inc.   Paroxetine Tablets What is this medicine? PAROXETINE (pa ROX e teen) is used to treat depression. It may also be used to treat anxiety disorders, obsessive compulsive disorder, panic attacks, post traumatic stress, and premenstrual dysphoric disorder (PMDD). This medicine may be used for other purposes; ask your health care provider or pharmacist if you have questions. COMMON BRAND NAME(S): Paxil, Pexeva What should I tell my health care provider before I take this medicine? They need to know if you have any of these conditions:  bipolar disorder or a family history of bipolar disorder  bleeding disorders  glaucoma  heart disease  kidney disease  liver disease  low levels of sodium in the blood  seizures  suicidal thoughts, plans, or attempt; a previous suicide attempt by you or a  family member  take MAOIs like Carbex, Eldepryl, Marplan, Nardil, and Parnate  take medicines that treat or prevent blood clots  thyroid disease  an unusual or allergic reaction to paroxetine, other medicines, foods, dyes, or preservatives  pregnant or trying to get  pregnant  breast-feeding How should I use this medicine? Take this medicine by mouth with a glass of water. Follow the directions on the prescription label. You can take it with or without food. Take your medicine at regular intervals. Do not take your medicine more often than directed. Do not stop taking this medicine suddenly except upon the advice of your doctor. Stopping this medicine too quickly may cause serious side effects or your condition may worsen. A special MedGuide will be given to you by the pharmacist with each prescription and refill. Be sure to read this information carefully each time. Talk to your pediatrician regarding the use of this medicine in children. Special care may be needed. Overdosage: If you think you have taken too much of this medicine contact a poison control center or emergency room at once. NOTE: This medicine is only for you. Do not share this medicine with others. What if I miss a dose? If you miss a dose, take it as soon as you can. If it is almost time for your next dose, take only that dose. Do not take double or extra doses. What may interact with this medicine? Do not take this medicine with any of the following medications:  linezolid  MAOIs like Carbex, Eldepryl, Marplan, Nardil, and Parnate  methylene blue (injected into a vein)  pimozide  thioridazine This medicine may also interact with the following medications:  alcohol  amphetamines  aspirin and aspirin-like medicines  atomoxetine  certain medicines for depression, anxiety, or psychotic disturbances  certain medicines for irregular heart beat like propafenone, flecainide, encainide, and quinidine  certain medicines for migraine headache like almotriptan, eletriptan, frovatriptan, naratriptan, rizatriptan, sumatriptan, zolmitriptan  cimetidine  digoxin  diuretics  fentanyl  fosamprenavir  furazolidone  isoniazid  lithium  medicines that treat or prevent blood  clots like warfarin, enoxaparin, and dalteparin  medicines for sleep  NSAIDs, medicines for pain and inflammation, like ibuprofen or naproxen  phenobarbital  phenytoin  procarbazine  rasagiline  ritonavir  supplements like St. John's wort, kava kava, valerian  tamoxifen  tramadol  tryptophan This list may not describe all possible interactions. Give your health care provider a list of all the medicines, herbs, non-prescription drugs, or dietary supplements you use. Also tell them if you smoke, drink alcohol, or use illegal drugs. Some items may interact with your medicine. What should I watch for while using this medicine? Tell your doctor if your symptoms do not get better or if they get worse. Visit your doctor or health care professional for regular checks on your progress. Because it may take several weeks to see the full effects of this medicine, it is important to continue your treatment as prescribed by your doctor. Patients and their families should watch out for new or worsening thoughts of suicide or depression. Also watch out for sudden changes in feelings such as feeling anxious, agitated, panicky, irritable, hostile, aggressive, impulsive, severely restless, overly excited and hyperactive, or not being able to sleep. If this happens, especially at the beginning of treatment or after a change in dose, call your health care professional. Bonita Quin may get drowsy or dizzy. Do not drive, use machinery, or do anything that needs mental alertness until you know  how this medicine affects you. Do not stand or sit up quickly, especially if you are an older patient. This reduces the risk of dizzy or fainting spells. Alcohol may interfere with the effect of this medicine. Avoid alcoholic drinks. Your mouth may get dry. Chewing sugarless gum or sucking hard candy, and drinking plenty of water will help. Contact your doctor if the problem does not go away or is severe. What side effects may I  notice from receiving this medicine? Side effects that you should report to your doctor or health care professional as soon as possible:  allergic reactions like skin rash, itching or hives, swelling of the face, lips, or tongue  anxious  black, tarry stools  changes in vision  confusion  elevated mood, decreased need for sleep, racing thoughts, impulsive behavior  eye pain  fast, irregular heartbeat  feeling faint or lightheaded, falls  feeling agitated, angry, or irritable  hallucination, loss of contact with reality  loss of balance or coordination  loss of memory  painful or prolonged erections  restlessness, pacing, inability to keep still  seizures  stiff muscles  suicidal thoughts or other mood changes  trouble sleeping  unusual bleeding or bruising  unusually weak or tired  vomiting Side effects that usually do not require medical attention (report to your doctor or health care professional if they continue or are bothersome):  change in appetite or weight  change in sex drive or performance  diarrhea  dizziness  dry mouth  increased sweating  indigestion, nausea  tired  tremors This list may not describe all possible side effects. Call your doctor for medical advice about side effects. You may report side effects to FDA at 1-800-FDA-1088. Where should I keep my medicine? Keep out of the reach of children. Store at room temperature between 15 and 30 degrees C (59 and 86 degrees F). Keep container tightly closed. Throw away any unused medicine after the expiration date. NOTE: This sheet is a summary. It may not cover all possible information. If you have questions about this medicine, talk to your doctor, pharmacist, or health care provider.  2021 Elsevier/Gold Standard (2019-11-25 14:25:05)   Alprazolam tablets What is this medicine? ALPRAZOLAM (al PRAY zoe lam) is a benzodiazepine. It is used to treat anxiety and panic  attacks. This medicine may be used for other purposes; ask your health care provider or pharmacist if you have questions. COMMON BRAND NAME(S): Xanax What should I tell my health care provider before I take this medicine? They need to know if you have any of these conditions:  depression or other mental health disease  history of alcohol or medicine abuse or addiction  kidney disease  liver disease  lung disease, asthma, or breathing problem  seizures  suicidal thoughts, plans or attempt  an unusual or allergic reaction to alprazolam, other benzodiazepines, foods, dyes, or preservatives  pregnant or trying to get pregnant  breast-feeding How should I use this medicine? Take this medicine by mouth. Take it as directed on the prescription label. Do not take it more often than directed. Keep taking it unless your health care provider tells you to stop. A special MedGuide will be given to you by the pharmacist with each prescription and refill. Be sure to read this information carefully each time. Talk to your health care provider about the use of this medicine in children. Special care may be needed. Patients over 68 years of age may have a stronger reaction and need a  smaller dose. Overdosage: If you think you have taken too much of this medicine contact a poison control center or emergency room at once. NOTE: This medicine is only for you. Do not share this medicine with others. What if I miss a dose? If you miss a dose, take it as soon as you can. If it is almost time for your next dose, take only that dose. Do not take double or extra doses. What may interact with this medicine? Do not take this medicine with any of the following medications:  certain antivirals for HIV or hepatitis  certain medicines for fungal infections like ketoconazole, itraconazole, or posaconazole  clarithromycin  grapefruit juice  narcotic medicines for cough  sodium oxybate This medicine may  also interact with the following medications:  alcohol  antihistamines for allergy, cough and cold  certain medicines for anxiety or sleep  certain medicines for depression like amitriptyline, fluoxetine, fluvoxamine, nefazodone, sertraline  certain medicines for seizures like carbamazepine, phenobarbital, phenytoin, primidone  cimetidine  digoxin  erythromycin  female hormones, like estrogens or progestins and birth control pills, patches, rings, or injections  general anesthetics like halothane, isoflurane, methoxyflurane, propofol  medicines that relax muscles  narcotic medicines for pain  phenothiazines like chlorpromazine, mesoridazine, prochlorperazine, thioridazine This list may not describe all possible interactions. Give your health care provider a list of all the medicines, herbs, non-prescription drugs, or dietary supplements you use. Also tell them if you smoke, drink alcohol, or use illegal drugs. Some items may interact with your medicine. What should I watch for while using this medicine? Visit your health care provider for regular checks on your progress. Tell your health care provider if your symptoms do not start to get better or if they get worse. Do not stop taking except on your doctor's advice. You may develop a severe reaction. Your doctor will tell you how much medicine to take. You may get drowsy or dizzy. Do not drive, use machinery, or do anything that needs mental alertness until you know how this medicine affects you. To reduce the risk of dizzy and fainting spells, do not stand or sit up quickly, especially if you are an older patient. Alcohol may increase dizziness and drowsiness. Avoid alcoholic drinks. If you are taking another medicine that also causes drowsiness, you may have more side effects. Give your health care provider a list of all medicines you use. Your doctor will tell you how much medicine to take. Do not take more medicine than directed.  Call emergency for help if you have problems breathing or unusual sleepiness. Women should inform their health care provider if they wish to become pregnant or think they might be pregnant. Do not breast-feed while taking this medicine. Talk to your health care provider for more information. What side effects may I notice from receiving this medicine? Side effects that you should report to your doctor or health care professional as soon as possible:  allergic reactions like skin rash, itching or hives, swelling of the face, lips, or tongue  confusion  loss of balance or coordination  signs and symptoms of low blood pressure like dizziness; feeling faint or lightheaded, falls; unusually weak or tired  suicidal thoughts or other mood changes  trouble breathing  trouble saying things clearly Side effects that usually do not require medical attention (report to your doctor or health care professional if they continue or are bothersome):  changes in sex drive  drowsiness  dry mouth  tiredness This list  may not describe all possible side effects. Call your doctor for medical advice about side effects. You may report side effects to FDA at 1-800-FDA-1088. Where should I keep my medicine? Keep out of the reach of children and pets. This medicine can be abused. Keep it in a safe place to protect it from theft. Do not share it with anyone. It is only for you. Selling or giving away this medicine is dangerous and against the law. Store at room temperature between 20 and 25 degrees C (68 and 77 degrees F). Get rid of any unused medicine after the expiration date. This medicine may cause harm and death if it is taken by other adults, children, or pets. It is important to get rid of the medicine as soon as you no longer need it or it is expired. You can do this in two ways:  Take the medicine to a medicine take-back program. Check with your pharmacy or law enforcement to find a location.  If you  cannot return the medicine, check the label or package insert to see if the medicine should be thrown out in the garbage or flushed down the toilet. If you are not sure, ask your health care provider. If it is safe to put it in the trash, take the medicine out of the container. Mix the medicine with cat litter, dirt, coffee grounds, or other unwanted substance. Seal the mixture in a bag or container. Put it in the trash. NOTE: This sheet is a summary. It may not cover all possible information. If you have questions about this medicine, talk to your doctor, pharmacist, or health care provider.  2021 Elsevier/Gold Standard (2019-07-08 15:56:13)

## 2020-04-30 NOTE — Progress Notes (Signed)
Virtual Visit via Telephone Note  I connected with [name] on 04/30/20 at  3:45 PM EDT by telephone and verified that I am speaking with the correct person using two identifiers.  Location:  Patient: [place] (home)  Provider: Serafina Royals, CNM ([facility], [facility])   I discussed the limitations, risks, security and privacy concerns of performing an evaluation and management service by telephone and the availability of in person appointments. I also discussed with the patient that there may be a patient responsible charge related to this service. The patient expressed understanding and agreed to proceed.   History of Present Illness:  Patient last seen in office for management of anxiety and insomnia on 04/02/2020; for further details, please see previous note.   Patient feels better taking 20 mg Paxil PO daily. Notes difficulty staying asleep and obtaining restful sleep.   No SI/HI. Concerned about adding additional medications for sleep at this time.   Observations/Objective:  Depression screen [facility] 2/9 04/30/2020 04/02/2020 03/22/2019  Decreased Interest 0 1 0  Down, Depressed, Hopeless 0 1 0  PHQ - 2 Score 0 2 0  Altered sleeping 2 3 1   Tired, decreased energy 1 2 1   Change in appetite 0 1 0  Feeling bad or failure about yourself  0 1 1  Trouble concentrating 1 2 1   Moving slowly or fidgety/restless 0 1 1  Suicidal thoughts 0 0 0  PHQ-9 Score 4 12 5   Difficult doing work/chores Somewhat difficult Very difficult Somewhat difficult    GAD 7 : Generalized Anxiety Score 04/30/2020 04/02/2020 06/20/2019 03/22/2019  Nervous, Anxious, on Edge 1 2 1 2   Control/stop worrying 2 2 0 1  Worry too much - different things 1 2 0 1  Trouble relaxing 2 3 1 2   Restless 1 3 2 2   Easily annoyed or irritable 3 3 1 3   Afraid - awful might happen 0 1 0 0  Total GAD 7 Score 10 16 5 11   Anxiety Difficulty Somewhat difficult Very difficult Somewhat difficult Somewhat  difficult    Assessment  Anxiety  Plan:  Patient agrees to remain on 20 mg Paxil PO daily and will attempt sleep hygiene techniques and increase Xanax to 1 mg PO nightly. Will update CNM if this change does not provide success.   Reviewed red flag symptoms and when to call.   RTC as previously scheduled or sooner if needed.   Follow Up Instructions:    I discussed the assessment and treatment plan with the patient. The patient was provided an opportunity to ask questions and all were answered. The patient agreed with the plan and demonstrated an understanding of the instructions.   The patient was advised to call back or seek an in-person evaluation if the symptoms worsen or if the condition fails to improve as anticipated.  I provided 8 minutes of non-face-to-face time during this encounter.   05/02/2020, CNM  [facility], [facility] 04/30/20 3:38 PM

## 2020-05-29 ENCOUNTER — Ambulatory Visit: Payer: Self-pay

## 2020-05-29 ENCOUNTER — Encounter: Payer: Self-pay | Admitting: Emergency Medicine

## 2020-05-29 ENCOUNTER — Other Ambulatory Visit: Payer: Self-pay | Admitting: Certified Nurse Midwife

## 2020-05-29 ENCOUNTER — Ambulatory Visit
Admission: EM | Admit: 2020-05-29 | Discharge: 2020-05-29 | Disposition: A | Discharge status: Home / Self Care | Payer: BC Managed Care – PPO | Attending: Physician Assistant | Admitting: Physician Assistant

## 2020-05-29 ENCOUNTER — Other Ambulatory Visit: Payer: Self-pay

## 2020-05-29 DIAGNOSIS — R059 Cough, unspecified: Secondary | ICD-10-CM | POA: Insufficient documentation

## 2020-05-29 DIAGNOSIS — J029 Acute pharyngitis, unspecified: Secondary | ICD-10-CM | POA: Diagnosis not present

## 2020-05-29 LAB — GROUP A STREP BY PCR: Group A Strep by PCR: NOT DETECTED

## 2020-05-29 MED ORDER — LIDOCAINE VISCOUS HCL 2 % MT SOLN: 15.0000 mL | OROMUCOSAL | 0 refills | Status: AC | PRN

## 2020-05-29 MED ORDER — PROMETHAZINE-DM 6.25-15 MG/5ML PO SYRP: 5.0000 mL | ORAL_SOLUTION | Freq: Four times a day (QID) | ORAL | 0 refills | Status: DC | PRN

## 2020-05-29 NOTE — ED Triage Notes (Signed)
Patient c/o sore throat that started yesterday.  Patient denies fevers.  

## 2020-05-29 NOTE — ED Provider Notes (Signed)
[facility]    CSN: [id number] Arrival date & time: 05/29/20  1449      History   Chief Complaint Chief Complaint  Patient presents with  . Sore Throat    HPI Mary Parsons is a 30 y.o. female presenting for 2-day history of sore throat and cough.  She denies fever, fatigue, chills, body aches, nasal congestion, chest pain, shortness of breath, nausea/vomiting or diarrhea.  No sick contacts and no known exposure to COVID-19.  Not vaccinated for COVID-19.  Not interested in getting COVID tested today.  Patient concerned about possible strep throat.  Has taken over-the-counter cough syrup.  No other complaints or concerns.  HPI  Past Medical History:  Diagnosis Date  . Anxiety    NO MEDS  . Complication of anesthesia    HARD TIME WAKING UP  . Family history of adverse reaction to anesthesia    MOM AND SISTER-N/V  . GERD (gastroesophageal reflux disease)    OCC  . Headache    MIGRAINES  . Heavy periods   . History of kidney stones    H/O    Patient Active Problem List   Diagnosis Date Noted  . S/P laparoscopic hysterectomy 09/09/2019  . Left ovarian cyst 09/13/2018  . History of miscarriage 10/12/2017  . History of C-section 10/09/2017  . Pelvic pain 09/25/2016  . Dyspareunia in female 09/25/2016  . Family history of endometriosis 08/15/2016  . Eczema 11/11/2013    Past Surgical History:  Procedure Laterality Date  . ABDOMINAL HYSTERECTOMY    . CESAREAN SECTION    . CESAREAN SECTION WITH BILATERAL TUBAL LIGATION Bilateral 04/24/2018   Procedure: REPEAT CESAREAN SECTION WITH BILATERAL TUBAL LIGATION;  Surgeon: Hildred Laserherry, Anika, MD;  Location: [facility];  Service: Obstetrics;  Laterality: Bilateral;  TOB: 12:11 Sex: Female Weight: 7 lb 1 oz  . DILATION AND EVACUATION N/A 11/18/2016   Procedure: DILATATION AND EVACUATION;  Surgeon: Hildred Laserherry, Anika, MD;  Location: [facility];  Service: Gynecology;  Laterality: N/A;  . LAPAROSCOPIC UNILATERAL SALPINGO  OOPHERECTOMY Left 09/09/2019   Procedure: LAPAROSCOPIC UNILATERAL SALPINGO OOPHORECTOMY;  Surgeon: Hildred Laserherry, Anika, MD;  Location: [facility];  Service: Gynecology;  Laterality: Left;  . LAPAROSCOPIC VAGINAL HYSTERECTOMY WITH SALPINGO OOPHORECTOMY Left 09/09/2019   Procedure: LAPAROSCOPIC ASSISTED VAGINAL HYSTERECTOMY WITH BILATERAL SALPINGECTOMY;  Surgeon: Hildred Laserherry, Anika, MD;  Location: [facility];  Service: Gynecology;  Laterality: Left;  . LAPAROSCOPY N/A 11/18/2016   Procedure: LAPAROSCOPY DIAGNOSTIC;  Surgeon: Hildred Laserherry, Anika, MD;  Location: [facility];  Service: Gynecology;  Laterality: N/A;  . TONSILLECTOMY      OB History    Gravida  3   Para  2   Term  2   Preterm      AB  1   Living  2     SAB  1   IAB      Ectopic      Multiple  0   Live Births  2            Home Medications    Prior to Admission medications   Medication Sig Start Date End Date Taking? Authorizing Provider  ALPRAZolam Prudy Feeler(XANAX) 0.5 MG tablet Take 1 tablet (0.5 mg total) by mouth 2 (two) times daily as needed for anxiety or sleep. 04/02/20  Yes Lawhorn, Vanessa DurhamJenkins Michelle, CNM  lidocaine (XYLOCAINE) 2 % solution Use as directed 15 mLs in the mouth or throat every 3 (three) hours as needed for up to 5 days for mouth pain (swish  and spit). 05/29/20 06/03/20 Yes Shirlee Latch, PA-C  PARoxetine (PAXIL) 20 MG tablet Take 1 tablet (20 mg total) by mouth daily. 04/02/20  Yes Lawhorn, Vanessa Darlington, CNM  promethazine-dextromethorphan (PROMETHAZINE-DM) 6.25-15 MG/5ML syrup Take 5 mLs by mouth 4 (four) times daily as needed for cough. 05/29/20  Yes Eusebio Friendly B, PA-C  rizatriptan (MAXALT-MLT) 5 MG disintegrating tablet Take 1 tablet (5 mg total) by mouth as needed for migraine. May repeat in 2 hours if needed 04/02/20  Yes Lawhorn, Vanessa Onset, CNM  topiramate (TOPAMAX) 25 MG tablet Take 1 tablet (25 mg total) by mouth every evening for 14 days, THEN 2 tablets (50 mg total) every evening for 16 days. 11/22/19  12/22/19  Gunnar Bulla, CNM    Family History Family History  Problem Relation Age of Onset  . Endometriosis Mother   . Endometriosis Maternal Aunt   . Endometriosis Maternal Grandmother   . Healthy Father   . Breast cancer Neg Hx   . Ovarian cancer Neg Hx   . Colon cancer Neg Hx     Social History Social History   Tobacco Use  . Smoking status: Never Smoker  . Smokeless tobacco: Never Used  Vaping Use  . Vaping Use: Never used  Substance Use Topics  . Alcohol use: Yes    Comment: occasional   . Drug use: No     Allergies   Adhesive [tape]   Review of Systems Review of Systems  Constitutional: Negative for chills, diaphoresis, fatigue and fever.  HENT: Positive for sore throat. Negative for congestion, ear pain, rhinorrhea, sinus pressure and sinus pain.   Respiratory: Positive for cough. Negative for shortness of breath.   Gastrointestinal: Negative for abdominal pain, nausea and vomiting.  Musculoskeletal: Negative for arthralgias and myalgias.  Skin: Negative for rash.  Neurological: Negative for weakness and headaches.  Hematological: Negative for adenopathy.     Physical Exam Triage Vital Signs ED Triage Vitals  Enc Vitals Group     BP 05/29/20 1503 133/84     Pulse Rate 05/29/20 1503 66     Resp 05/29/20 1503 14     Temp 05/29/20 1503 98.4 F (36.9 C)     Temp Source 05/29/20 1503 Oral     SpO2 05/29/20 1503 99 %     Weight 05/29/20 1459 170 lb (77.1 kg)     Height 05/29/20 1459 5\' 2"  (1.575 m)     Head Circumference --      Peak Flow --      Pain Score 05/29/20 1459 8     Pain Loc --      Pain Edu? --      Excl. in GC? --    No data found.  Updated Vital Signs BP 133/84 (BP Location: Right Arm)   Pulse 66   Temp 98.4 F (36.9 C) (Oral)   Resp 14   Ht 5\' 2"  (1.575 m)   Wt 170 lb (77.1 kg)   LMP 08/25/2019 (Exact Date)   SpO2 99%   BMI 31.09 kg/m       Physical Exam Vitals and nursing note reviewed.   Constitutional:      General: She is not in acute distress.    Appearance: Normal appearance. She is not ill-appearing or toxic-appearing.  HENT:     Head: Normocephalic and atraumatic.     Nose: Congestion present.     Mouth/Throat:     Mouth: Mucous membranes are moist.  Pharynx: Oropharynx is clear. Posterior oropharyngeal erythema present.  Eyes:     General: No scleral icterus.       Right eye: No discharge.        Left eye: No discharge.     Conjunctiva/sclera: Conjunctivae normal.  Cardiovascular:     Rate and Rhythm: Normal rate and regular rhythm.     Heart sounds: Normal heart sounds.  Pulmonary:     Effort: Pulmonary effort is normal. No respiratory distress.     Breath sounds: Normal breath sounds.  Musculoskeletal:     Cervical back: Neck supple.  Lymphadenopathy:     Cervical: No cervical adenopathy.  Skin:    General: Skin is dry.  Neurological:     General: No focal deficit present.     Mental Status: She is alert. Mental status is at baseline.     Motor: No weakness.     Gait: Gait normal.  Psychiatric:        Mood and Affect: Mood normal.        Behavior: Behavior normal.        Thought Content: Thought content normal.      UC Treatments / Results  Labs (all labs ordered are listed, but only abnormal results are displayed) Labs Reviewed  GROUP A STREP BY PCR    EKG   Radiology No results found.  Procedures Procedures (including critical care time)  Medications Ordered in UC Medications - No data to display  Initial Impression / Assessment and Plan / UC Course  I have reviewed the triage vital signs and the nursing notes.  Pertinent labs & imaging results that were available during my care of the patient were reviewed by me and considered in my medical decision making (see chart for details).   30 year old female presenting for sore throat and cough x2 days.  Vitals are all stable in clinic and she is overall well-appearing.  Has  nasal congestion and mild posterior pharyngeal erythema.  Her chest is clear to auscultation heart regular rate rhythm.  Patient declines COVID-19 test.  Molecular strep test performed today with negative result.  Advised patient symptoms consistent with viral illness and supportive care encouraged.  Sent viscous lidocaine and Promethazine DM and also gave her a work note.  Advised to follow-up for any worsening symptoms or if not feeling better after 7 to 10 days.  Advised that she does not want to get tested for COVID-19 she should follow CDC guidelines.   Final Clinical Impressions(s) / UC Diagnoses   Final diagnoses:  Viral pharyngitis  Cough     Discharge Instructions     URI/COLD SYMPTOMS: Your exam today is consistent with a viral illness. Antibiotics are not indicated at this time. Use medications as directed, including cough syrup, nasal saline, and decongestants. Your symptoms should improve over the next few days and resolve within 7-10 days. Increase rest and fluids. F/u if symptoms worsen or predominate such as sore throat, ear pain, productive cough, shortness of breath, or if you develop high fevers or worsening fatigue over the next several days.      ED Prescriptions    Medication Sig Dispense Auth. Provider   lidocaine (XYLOCAINE) 2 % solution Use as directed 15 mLs in the mouth or throat every 3 (three) hours as needed for up to 5 days for mouth pain (swish and spit). 100 mL Eusebio Friendly B, PA-C   promethazine-dextromethorphan (PROMETHAZINE-DM) 6.25-15 MG/5ML syrup Take 5 mLs by mouth 4 (four)  times daily as needed for cough. 118 mL Shirlee Latch, PA-C     PDMP not reviewed this encounter.   Shirlee Latch, PA-C 05/29/20 1556

## 2020-05-29 NOTE — Discharge Instructions (Signed)
URI/COLD SYMPTOMS: Your exam today is consistent with a viral illness. Antibiotics are not indicated at this time. Use medications as directed, including cough syrup, nasal saline, and decongestants. Your symptoms should improve over the next few days and resolve within 7-10 days. Increase rest and fluids. F/u if symptoms worsen or predominate such as sore throat, ear pain, productive cough, shortness of breath, or if you develop high fevers or worsening fatigue over the next several days.    

## 2020-07-09 ENCOUNTER — Encounter: Payer: Self-pay | Admitting: Obstetrics and Gynecology

## 2020-07-09 ENCOUNTER — Other Ambulatory Visit: Payer: Self-pay

## 2020-07-09 ENCOUNTER — Ambulatory Visit (INDEPENDENT_AMBULATORY_CARE_PROVIDER_SITE_OTHER): Payer: BC Managed Care – PPO | Admitting: Obstetrics and Gynecology

## 2020-07-09 VITALS — BP 116/73 | HR 75 | Ht 62.0 in | Wt 180.1 lb

## 2020-07-09 DIAGNOSIS — F419 Anxiety disorder, unspecified: Secondary | ICD-10-CM

## 2020-07-09 DIAGNOSIS — R5383 Other fatigue: Secondary | ICD-10-CM

## 2020-07-09 DIAGNOSIS — R102 Pelvic and perineal pain: Secondary | ICD-10-CM

## 2020-07-09 DIAGNOSIS — R4586 Emotional lability: Secondary | ICD-10-CM

## 2020-07-09 DIAGNOSIS — R238 Other skin changes: Secondary | ICD-10-CM

## 2020-07-09 DIAGNOSIS — R233 Spontaneous ecchymoses: Secondary | ICD-10-CM

## 2020-07-09 NOTE — Progress Notes (Signed)
    GYNECOLOGY PROGRESS NOTE  Subjective:    Patient ID: Mary Parsons, female    DOB: Jan 11, 1991, 30 y.o.   MRN: [id number]  HPI  Patient is a 30 y.o. [id number] female who presents for complaints of fatigue, and easy bruising.  She desires to have her iron levels checked.    Mary Parsons also complains of pelvic cramping for the past 2 weeks.  She has additionally reported feeling more moody lately, and having trouble sleeping due to racing thoughts at night. She has a history of anxiety, currently on Xanax and Paxil. She has a family history of thyroid disease.  Otherwise no other complaints.    The following portions of the patient's history were reviewed and updated as appropriate: allergies, current medications, past family history, past medical history, past social history, past surgical history, and problem list.  Review of Systems Pertinent items noted in HPI and remainder of comprehensive ROS otherwise negative.   Objective:   Blood pressure 116/73, pulse 75, height 5\' 2"  (1.575 m), weight 180 lb 1.6 oz (81.7 kg), last menstrual period 08/25/2019. General appearance: alert and no distress Skin: no rashes, lesions, bruising present.  Remainder of exam deferred.    Assessment:   Plan:   Fatigue - unknown cause. Will check vitamin levels and TSH.  Easy bruising - will perform bleeding studies.  Anxiety and mood changes - discussed options use of meditation, OTC sleep aids in conjunction with Xanax, and/or increasing Xanax dosing to 1 mg in the evening.  Pelvic cramping - patient s/p hysterectomy and left oophorectomy. May be remaining effects after hysterectomy, with right ovary  more active. To monitor symptoms, track on a app if desired.   A total of 15 minutes were spent face-to-face with the patient during this encounter and over half of that time dealt with counseling and coordination of care.   10/25/2019, MD [facility]

## 2020-07-09 NOTE — Progress Notes (Signed)
Pt present to discuss being fatigue, possible iron deficiency, easy to bruise, and hormone replacement from hysterectomy.

## 2020-07-10 ENCOUNTER — Encounter: Payer: Self-pay | Admitting: Obstetrics and Gynecology

## 2020-07-10 ENCOUNTER — Other Ambulatory Visit: Payer: Self-pay | Admitting: Certified Nurse Midwife

## 2020-07-10 NOTE — Patient Instructions (Signed)

## 2020-07-11 LAB — CBC
Hematocrit: 40.2 % (ref 34.0–46.6)
Hemoglobin: 13.8 g/dL (ref 11.1–15.9)
MCH: 30.5 pg (ref 26.6–33.0)
MCHC: 34.3 g/dL (ref 31.5–35.7)
MCV: 89 fL (ref 79–97)
Platelets: 251 10*3/uL (ref 150–450)
RBC: 4.52 x10E6/uL (ref 3.77–5.28)
RDW: 12.3 % (ref 11.7–15.4)
WBC: 6.7 10*3/uL (ref 3.4–10.8)

## 2020-07-11 LAB — IRON,TIBC AND FERRITIN PANEL
Ferritin: 62 ng/mL (ref 15–150)
Iron Saturation: 41 % (ref 15–55)
Iron: 124 ug/dL (ref 27–159)
Total Iron Binding Capacity: 306 ug/dL (ref 250–450)
UIBC: 182 ug/dL (ref 131–425)

## 2020-07-11 LAB — TSH: TSH: 1.98 u[IU]/mL (ref 0.450–4.500)

## 2020-07-11 LAB — COAG STUDIES INTERP REPORT

## 2020-07-11 LAB — VON WILLEBRAND PANEL
Factor VIII Activity: 111 % (ref 56–140)
Von Willebrand Ag: 147 % (ref 50–200)
Von Willebrand Factor: 84 % (ref 50–200)

## 2020-07-11 LAB — VITAMIN D 25 HYDROXY (VIT D DEFICIENCY, FRACTURES): Vit D, 25-Hydroxy: 29 ng/mL — ABNORMAL LOW (ref 30.0–100.0)

## 2020-07-11 LAB — PROTIME-INR
INR: 0.9 (ref 0.9–1.2)
Prothrombin Time: 9.9 s (ref 9.1–12.0)

## 2020-07-11 LAB — APTT: aPTT: 28 s (ref 24–33)

## 2020-07-11 LAB — VITAMIN B12: Vitamin B-12: 494 pg/mL (ref 232–1245)

## 2020-07-15 ENCOUNTER — Encounter: Payer: BC Managed Care – PPO | Admitting: Obstetrics and Gynecology

## 2020-07-30 ENCOUNTER — Encounter: Payer: Self-pay | Admitting: Certified Nurse Midwife

## 2020-07-30 ENCOUNTER — Other Ambulatory Visit: Payer: Self-pay

## 2020-07-30 ENCOUNTER — Ambulatory Visit (INDEPENDENT_AMBULATORY_CARE_PROVIDER_SITE_OTHER): Payer: 59 | Admitting: Certified Nurse Midwife

## 2020-07-30 VITALS — BP 115/76 | HR 69 | Resp 16 | Ht 62.0 in | Wt 181.6 lb

## 2020-07-30 DIAGNOSIS — F419 Anxiety disorder, unspecified: Secondary | ICD-10-CM | POA: Diagnosis not present

## 2020-07-30 DIAGNOSIS — Z01419 Encounter for gynecological examination (general) (routine) without abnormal findings: Secondary | ICD-10-CM

## 2020-07-30 DIAGNOSIS — G4700 Insomnia, unspecified: Secondary | ICD-10-CM | POA: Diagnosis not present

## 2020-07-30 DIAGNOSIS — Z1159 Encounter for screening for other viral diseases: Secondary | ICD-10-CM

## 2020-07-30 DIAGNOSIS — Z6833 Body mass index (BMI) 33.0-33.9, adult: Secondary | ICD-10-CM

## 2020-07-30 MED ORDER — ALPRAZOLAM 1 MG PO TABS: 1.0000 mg | ORAL_TABLET | Freq: Every day | ORAL | 2 refills | Status: DC

## 2020-07-30 NOTE — Progress Notes (Signed)
ANNUAL PREVENTATIVE CARE GYN  ENCOUNTER NOTE  Subjective:       Mary Parsons is a 30 y.o. [phone] female here for a routine annual gynecologic exam.  Current complaints: 1. Medication refill 2. Weight gain unresponsive to lifestyle modifications  Reports symptoms management using Paxil and Xanax.   Denies difficulty breathing or respiratory distress, chest pain, abdominal pain, dysuria, and leg pain or swelling.    Gynecologic History  Patient's last menstrual period was 08/25/2019 (exact date). Period Cycle (Days):  (hysterectomy)  Contraception: status post hysterectomy  Last Pap: 05/2018. Results were: normal  Obstetric History  OB History  Gravida Para Term Preterm AB Living  3 2 2   1 2   SAB IAB Ectopic Multiple Live Births  1     0 2    # Outcome Date GA Lbr Len/2nd Weight Sex Delivery Anes PTL Lv  3 Term 04/24/18 [redacted]w[redacted]d  7 lb 0.9 oz (3.2 kg) M CS-LTranv Spinal  LIV  2 SAB 11/18/16 [redacted]w[redacted]d         1 Term 08/21/15 [redacted]w[redacted]d  4 lb 2.2 oz (1.878 kg) M CS-Unspec   LIV     Complications: Fetal Intolerance    Past Medical History:  Diagnosis Date   Anxiety    NO MEDS   Complication of anesthesia    HARD TIME WAKING UP   Family history of adverse reaction to anesthesia    MOM AND SISTER-N/V   GERD (gastroesophageal reflux disease)    OCC   Headache    MIGRAINES   Heavy periods    History of kidney stones    H/O    Past Surgical History:  Procedure Laterality Date   ABDOMINAL HYSTERECTOMY     CESAREAN SECTION     CESAREAN SECTION WITH BILATERAL TUBAL LIGATION Bilateral 04/24/2018   Procedure: REPEAT CESAREAN SECTION WITH BILATERAL TUBAL LIGATION;  Surgeon: 06/24/2018, MD;  Location: [facility];  Service: Obstetrics;  Laterality: Bilateral;  TOB: 12:11 Sex: Female Weight: 7 lb 1 oz   DILATION AND EVACUATION N/A 11/18/2016   Procedure: DILATATION AND EVACUATION;  Surgeon: 13/02/2016, MD;  Location: [facility];  Service: Gynecology;  Laterality: N/A;   LAPAROSCOPIC  UNILATERAL SALPINGO OOPHERECTOMY Left 09/09/2019   Procedure: LAPAROSCOPIC UNILATERAL SALPINGO OOPHORECTOMY;  Surgeon: 09/11/2019, MD;  Location: [facility];  Service: Gynecology;  Laterality: Left;   LAPAROSCOPIC VAGINAL HYSTERECTOMY WITH SALPINGO OOPHORECTOMY Left 09/09/2019   Procedure: LAPAROSCOPIC ASSISTED VAGINAL HYSTERECTOMY WITH BILATERAL SALPINGECTOMY;  Surgeon: 09/11/2019, MD;  Location: [facility];  Service: Gynecology;  Laterality: Left;   LAPAROSCOPY N/A 11/18/2016   Procedure: LAPAROSCOPY DIAGNOSTIC;  Surgeon: 13/02/2016, MD;  Location: [facility];  Service: Gynecology;  Laterality: N/A;   TONSILLECTOMY      Current Outpatient Medications on File Prior to Visit  Medication Sig Dispense Refill   ALPRAZolam (XANAX) 0.5 MG tablet TAKE 1 TABLET BY MOUTH 2 TIMES DAILY AS NEEDED FOR ANXIETY OR SLEEP. 60 tablet 0   PARoxetine (PAXIL) 20 MG tablet TAKE 1 TABLET BY MOUTH DAILY. 30 tablet 5   No current facility-administered medications on file prior to visit.    Allergies  Allergen Reactions   Adhesive [Tape] Other (See Comments)    Burns skin off--tolerates PAPER TAPE ONLY    Social History   Socioeconomic History   Marital status: Married    Spouse name: Not on file   Number of children: Not on file   Years of education: Not on file  Highest education level: Not on file  Occupational History   Not on file  Tobacco Use   Smoking status: Never   Smokeless tobacco: Never  Vaping Use   Vaping Use: Never used  Substance and Sexual Activity   Alcohol use: Yes    Comment: occasional    Drug use: No   Sexual activity: Yes    Birth control/protection: Surgical    Comment: Tubal ligation/hysterectomy  Other Topics Concern   Not on file  Social History Narrative   Not on file   Social Determinants of Health   Financial Resource Strain: Not on file  Food Insecurity: Not on file  Transportation Needs: Not on file  Physical Activity: Not on file  Stress: Not on file   Social Connections: Not on file  Intimate Partner Violence: Not on file    Family History  Problem Relation Age of Onset   Endometriosis Mother    Endometriosis Maternal Aunt    Endometriosis Maternal Grandmother    Healthy Father    Breast cancer Neg Hx    Ovarian cancer Neg Hx    Colon cancer Neg Hx     The following portions of the patient's history were reviewed and updated as appropriate: allergies, current medications, past family history, past medical history, past social history, past surgical history and problem list.  Review of Systems  ROS negative except as noted above. Information obtained from patient.    Objective:   BP 115/76   Pulse 69   Resp 16   Ht 5\' 2"  (1.575 m)   Wt 181 lb 9.6 oz (82.4 kg)   LMP 08/25/2019 (Exact Date)   BMI 33.22 kg/m   CONSTITUTIONAL: Well-developed, well-nourished female in no acute distress.   PSYCHIATRIC: Normal mood and affect. Normal behavior. Normal judgment and  thought content.  NEUROLGIC: Alert and oriented to person, place, and time. Normal muscle tone coordination. No cranial nerve deficit noted.  HENT:  Normocephalic, atraumatic, External right and left ear normal.   EYES: Conjunctivae and EOM are normal.   NECK: Normal range of motion, supple, no masses.  Normal thyroid.   SKIN: Skin is warm and dry. No rash noted. Not diaphoretic. No erythema. No pallor.  CARDIOVASCULAR: Normal heart rate noted, regular rhythm, no murmur.  RESPIRATORY: Clear to auscultation bilaterally. Effort and breath sounds  normal, no problems with respiration noted.  BREASTS: Symmetric in size. No masses, skin changes, nipple drainage, or lymphadenopathy.  ABDOMEN: Soft, normal bowel sounds, no distention noted.  No tenderness,  rebound or guarding.   PELVIC:  External Genitalia: Normal  Vagina: Normal  Cervix: Normal  Uterus: Normal  Adnexa: Normal  MUSCULOSKELETAL: Normal range of motion. No tenderness.  No cyanosis,  clubbing, or edema.  2+ distal pulses.  GAD 7 : Generalized Anxiety Score 07/30/2020 04/30/2020 04/02/2020 06/20/2019  Nervous, Anxious, on Edge 2 1 2 1   Control/stop worrying 2 2 2  0  Worry too much - different things 2 1 2  0  Trouble relaxing 2 2 3 1   Restless 2 1 3 2   Easily annoyed or irritable 2 3 3 1   Afraid - awful might happen 1 0 1 0  Total GAD 7 Score 13 10 16 5   Anxiety Difficulty Somewhat difficult Somewhat difficult Very difficult Somewhat difficult    Assessment:   Annual gynecologic examination 30 y.o.  Contraception: status post hysterectomy           Obesity 1  Problem List Items Addressed This Visit   None Visit Diagnoses     Well woman exam    -  Primary   Anxiety       Insomnia, unspecified type       BMI 33.0-33.9,adult       Relevant Orders   Amb Ref to Medical Weight Management       Plan:  Pap: Not needed  Labs: Declined  Routine preventative health maintenance measures emphasized: Exercise/Diet/Weight control, Tobacco Warnings, Alcohol/Substance use risks, and Stress Management; see AVS  Reviewed red flag symptoms and when to call  Return to Clinic - 1 Year for [facility] or sooner if needed   Serafina Royals, CNM  [facility], [facility] 07/30/20 6:35 PM

## 2020-07-30 NOTE — Patient Instructions (Addendum)
Preventive Care 21-29 Years Old, Female Preventive care refers to lifestyle choices and visits with your health care provider that can promote health and wellness. This includes: A yearly physical exam. This is also called an annual wellness visit. Regular dental and eye exams. Immunizations. Screening for certain conditions. Healthy lifestyle choices, such as: Eating a healthy diet. Getting regular exercise. Not using drugs or products that contain nicotine and tobacco. Limiting alcohol use. What can I expect for my preventive care visit? Physical exam Your health care provider may check your: Height and weight. These may be used to calculate your BMI (body mass index). BMI is a measurement that tells if you are at a healthy weight. Heart rate and blood pressure. Body temperature. Skin for abnormal spots. Counseling Your health care provider may ask you questions about your: Past medical problems. Family's medical history. Alcohol, tobacco, and drug use. Emotional well-being. Home life and relationship well-being. Sexual activity. Diet, exercise, and sleep habits. Work and work environment. Access to firearms. Method of birth control. Menstrual cycle. Pregnancy history. What immunizations do I need?  Vaccines are usually given at various ages, according to a schedule. Your health care provider will recommend vaccines for you based on your age, medicalhistory, and lifestyle or other factors, such as travel or where you work. What tests do I need?  Blood tests Lipid and cholesterol levels. These may be checked every 5 years starting at age 20. Hepatitis C test. Hepatitis B test. Screening Diabetes screening. This is done by checking your blood sugar (glucose) after you have not eaten for a while (fasting). STD (sexually transmitted disease) testing, if you are at risk. BRCA-related cancer screening. This may be done if you have a family history of breast, ovarian, tubal, or  peritoneal cancers. Pelvic exam and Pap test. This may be done every 3 years starting at age 21. Starting at age 30, this may be done every 5 years if you have a Pap test in combination with an HPV test. Talk with your health care provider about your test results, treatment options,and if necessary, the need for more tests. Follow these instructions at home: Eating and drinking  Eat a healthy diet that includes fresh fruits and vegetables, whole grains, lean protein, and low-fat dairy products. Take vitamin and mineral supplements as recommended by your health care provider. Do not drink alcohol if: Your health care provider tells you not to drink. You are pregnant, may be pregnant, or are planning to become pregnant. If you drink alcohol: Limit how much you have to 0-1 drink a day. Be aware of how much alcohol is in your drink. In the [place] one drink equals one 12 oz bottle of beer (355 mL), one 5 oz glass of wine (148 mL), or one 1 oz glass of hard liquor (44 mL).  Lifestyle Take daily care of your teeth and gums. Brush your teeth every morning and night with fluoride toothpaste. Floss one time each day. Stay active. Exercise for at least 30 minutes 5 or more days each week. Do not use any products that contain nicotine or tobacco, such as cigarettes, e-cigarettes, and chewing tobacco. If you need help quitting, ask your health care provider. Do not use drugs. If you are sexually active, practice safe sex. Use a condom or other form of protection to prevent STIs (sexually transmitted infections). If you do not wish to become pregnant, use a form of birth control. If you plan to become pregnant, see your health care   provider for a prepregnancy visit. Find healthy ways to cope with stress, such as: Meditation, yoga, or listening to music. Journaling. Talking to a trusted person. Spending time with friends and family. Safety Always wear your seat belt while driving or riding in a  vehicle. Do not drive: If you have been drinking alcohol. Do not ride with someone who has been drinking. When you are tired or distracted. While texting. Wear a helmet and other protective equipment during sports activities. If you have firearms in your house, make sure you follow all gun safety procedures. Seek help if you have been physically or sexually abused. What's next? Go to your health care provider once a year for an annual wellness visit. Ask your health care provider how often you should have your eyes and teeth checked. Stay up to date on all vaccines. This information is not intended to replace advice given to you by your health care provider. Make sure you discuss any questions you have with your healthcare provider. Document Revised: 09/01/2019 Document Reviewed: 09/14/2017 Elsevier Patient Education  2022 [employer]  

## 2020-09-30 IMAGING — US OBSTETRIC 14+ WK ULTRASOUND
1 series · 13 of 28 positions shown · non-contrast
Comparison: none

CLINICAL DATA: Evaluate for growth, heavy.

EXAM:
OBSTETRICAL ULTRASOUND >14 WKS

[Series 1: obstetric 14+ wk ultrasound · 13 of 85 slices shown]
[im 4/85]
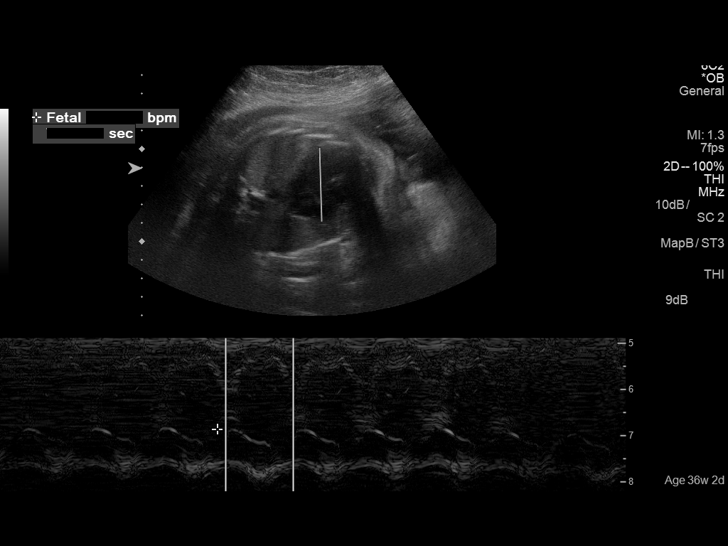
[im 10/85]
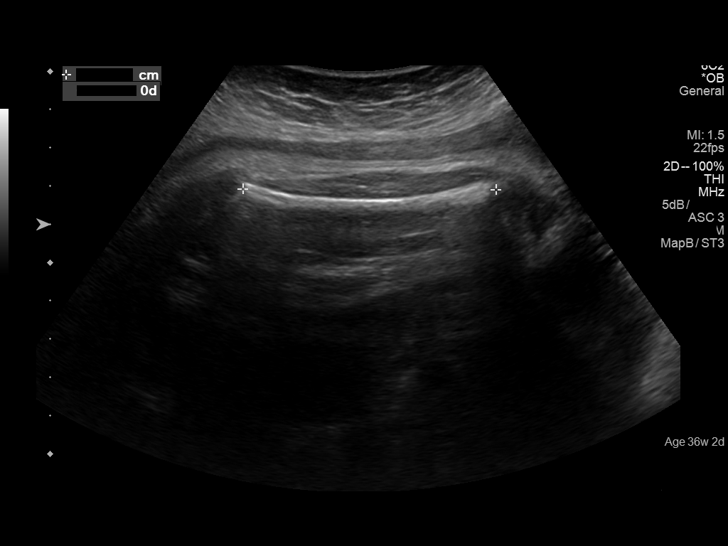
[im 16/85]
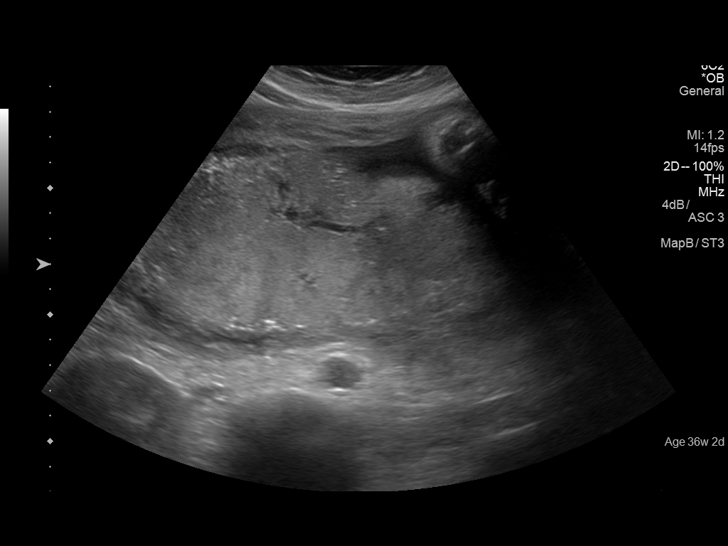
[im 22/85]
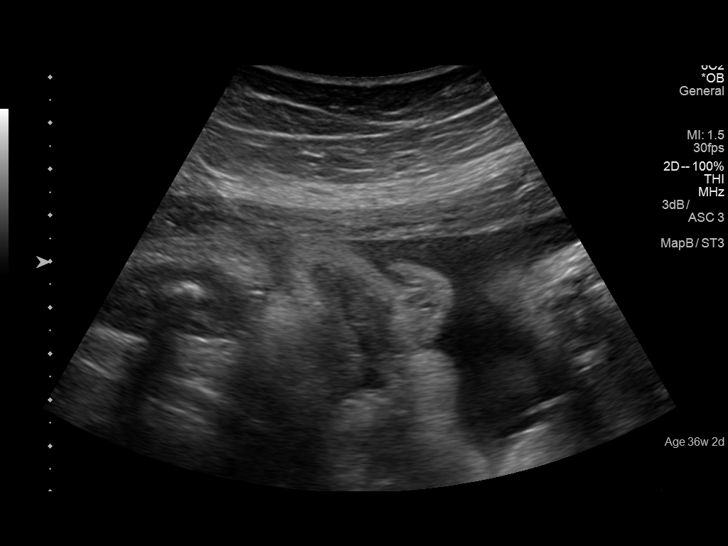
[im 29/85]
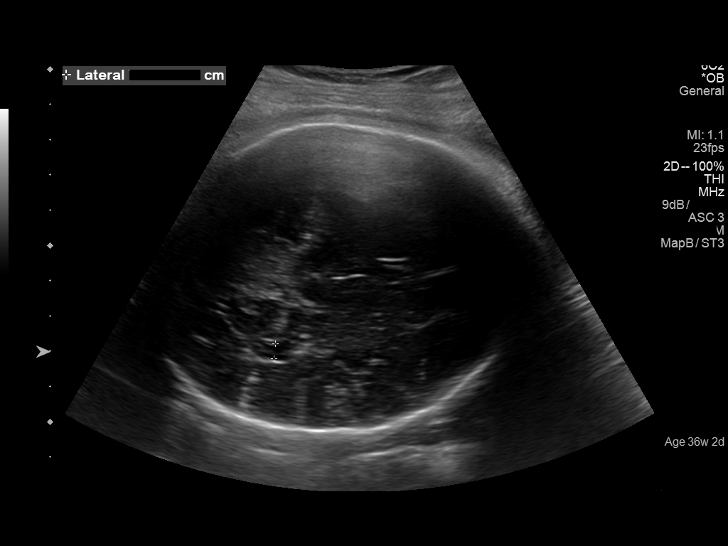
[im 35/85]
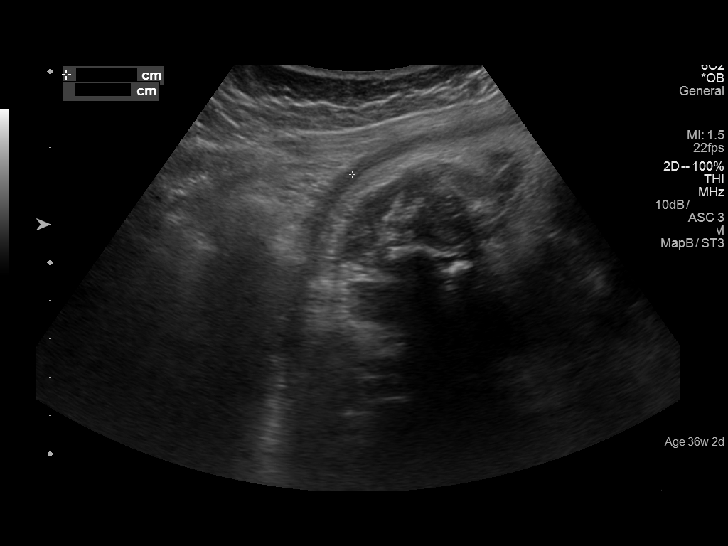
[im 44/85]
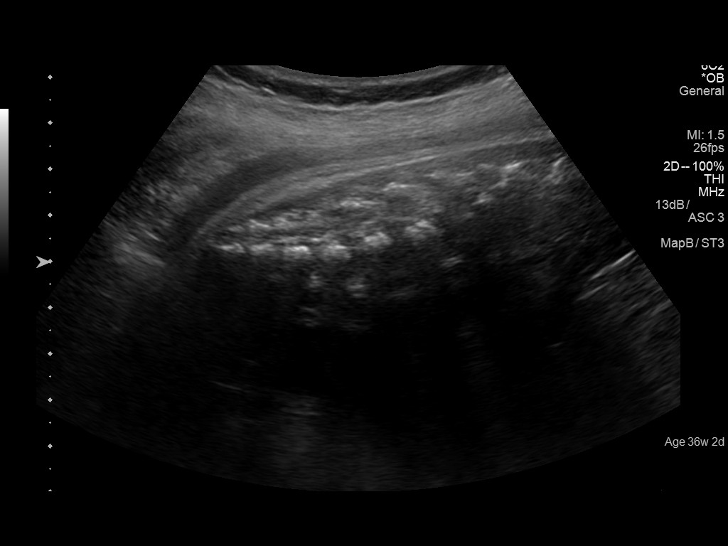
[im 50/85]
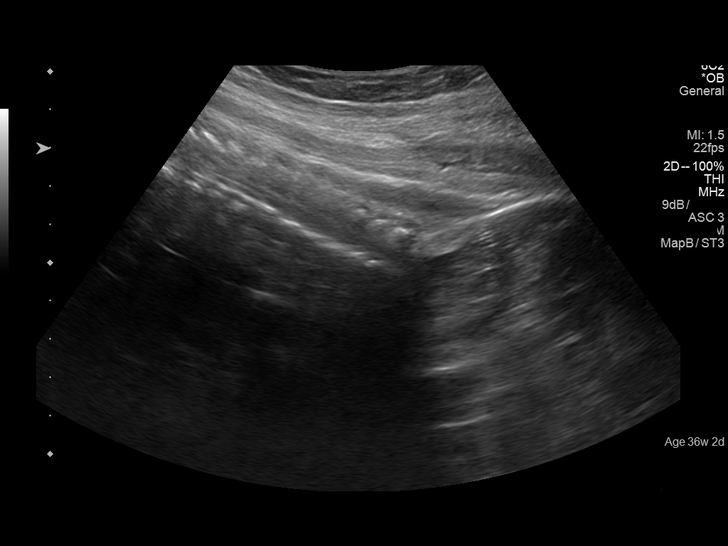
[im 57/85]
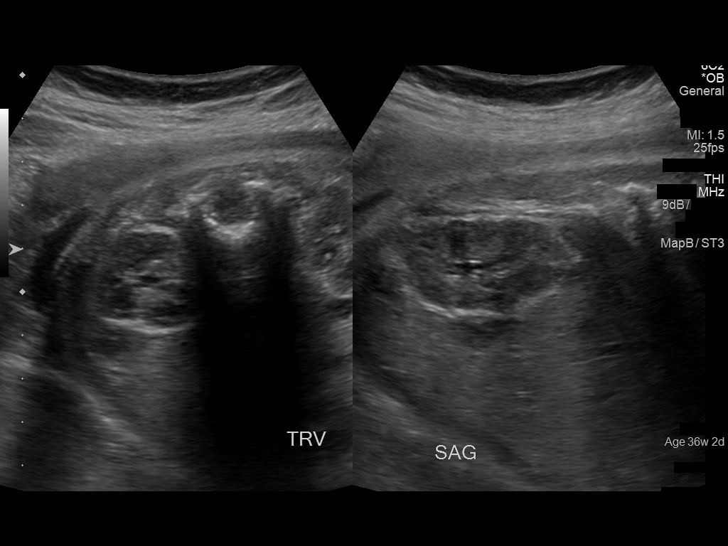
[im 63/85]
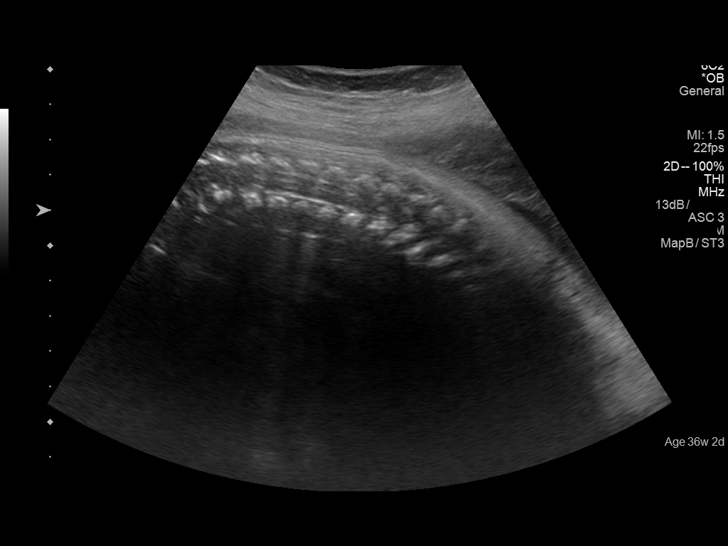
[im 69/85]
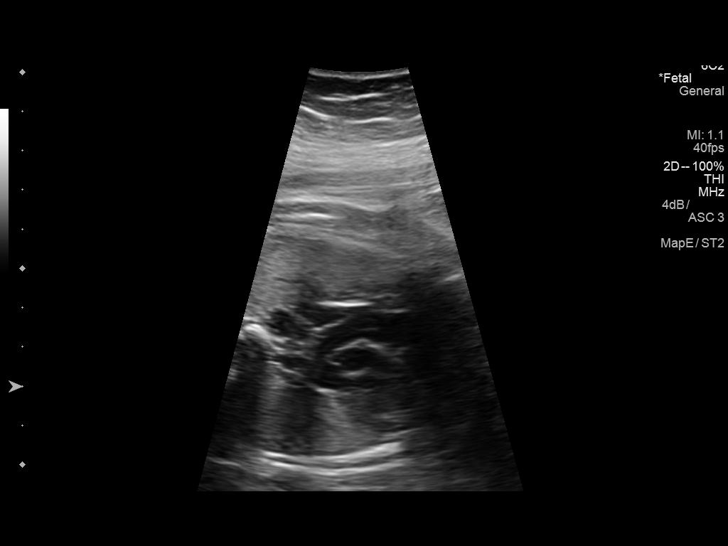
[im 75/85]
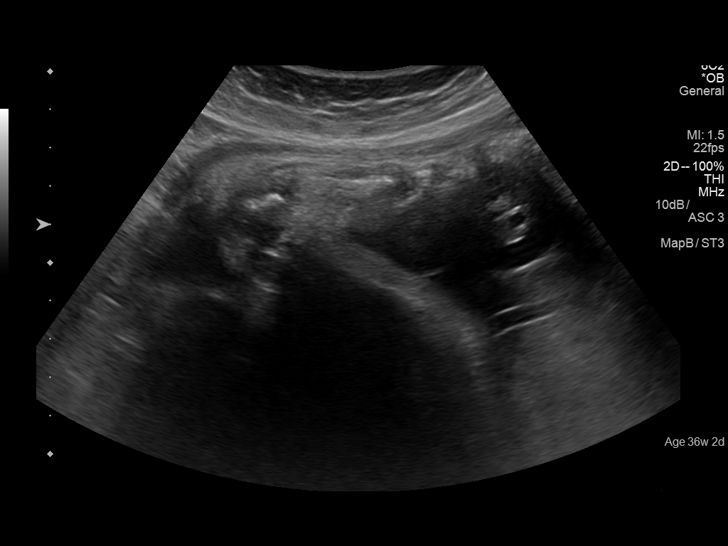
[im 81/85]
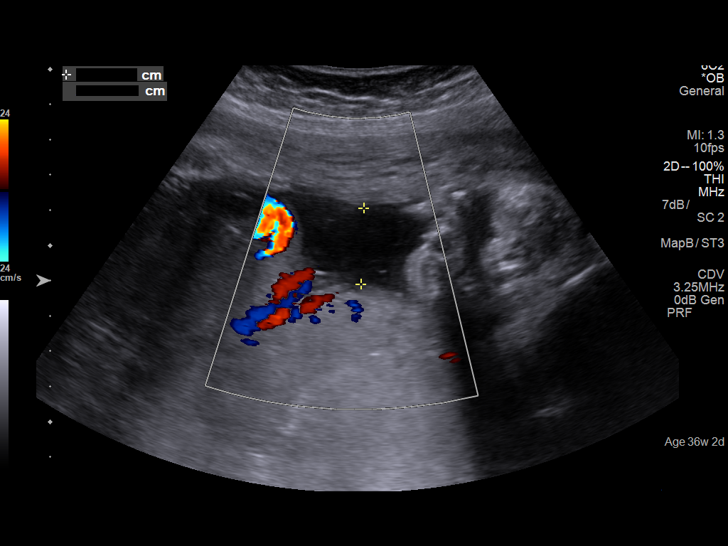

[13 of 28 positions shown; findings below may reference images not displayed]

FINDINGS: Number of Fetuses: 1

Heart Rate:  152 bpm

Movement: Yes

Presentation: Cephalic

Previa: No

Placental Location: Posterior fundal

Amniotic Fluid (Subjective): Normal

Amniotic Fluid (Objective):

AFI = 10.1 cm (5%ile= 7.7 cm, 95%= 24.9 cm for 36 wks)

FETAL BIOMETRY

BPD: 8.52cm 34w 2d

HC:   31.95cm 36w 0d

AC:   32.54cm 36w 3d

FL:   6.64cm 34w 1d

Current Mean GA: 35w 0d US EDC: 05/07/2018

Assigned GA:  5444w 33d Assigned EDC: 04/28/2018

Estimated Fetal Weight:  2718g 33%ile

FETAL ANATOMY

Lateral Ventricles: Appears normal

Thalami/CSP: Appears normal

Posterior Fossa:  Not visualized

Nuchal Region: Not visualized   NFT= N/A > 20 WKS

Upper Lip: Appears normal

Spine: Appears normal

4 Chamber Heart on Left: Appears normal

LVOT: Appears normal

RVOT: Appears normal

Stomach on Left: Appears normal

3 Vessel Cord: Appears normal

Cord Insertion site: Appears normal

Kidneys: Appears normal

Bladder: Appears normal

Extremities: Appears normal

Sex: Male

Technically difficult due to: Advanced gestational age.

Maternal Findings:

Cervix:  Normal cervical length measuring 4.5 cm.  Cervix is closed.
IMPRESSION: Single live intrauterine pregnancy as described above.

## 2020-10-07 MED ORDER — BUPROPION HCL ER (XL) 150 MG PO TB24: 150.0000 mg | ORAL_TABLET | Freq: Every day | ORAL | 3 refills | Status: DC

## 2021-06-03 ENCOUNTER — Encounter: Payer: 59 | Admitting: Obstetrics and Gynecology

## 2021-06-17 ENCOUNTER — Encounter: Payer: Self-pay | Admitting: Obstetrics

## 2021-06-17 ENCOUNTER — Encounter: Payer: 59 | Admitting: Obstetrics and Gynecology

## 2021-06-17 ENCOUNTER — Ambulatory Visit (INDEPENDENT_AMBULATORY_CARE_PROVIDER_SITE_OTHER): Payer: 59 | Admitting: Obstetrics

## 2021-06-17 VITALS — BP 129/83 | HR 82 | Ht 62.0 in | Wt 178.4 lb

## 2021-06-17 DIAGNOSIS — R102 Pelvic and perineal pain: Secondary | ICD-10-CM | POA: Diagnosis not present

## 2021-06-17 DIAGNOSIS — N941 Unspecified dyspareunia: Secondary | ICD-10-CM | POA: Diagnosis not present

## 2021-06-17 DIAGNOSIS — R635 Abnormal weight gain: Secondary | ICD-10-CM

## 2021-06-17 NOTE — Progress Notes (Signed)
GYN ENCOUNTER  Subjective  HPI: Mary Parsons is a 31 y.o. [id number] who presents today for evaluation of vaginal and pelvic pain. She is s/p hysterectomy and left salpingo-oophorectomy for endometriosis. She had 2 cesareans and a D&C prior to her hysterectomy. Recently, she has been feeling increased pelvic/lower abdominal pain on the right side and some pain with intercourse. The abdominal pain worsens cyclically and is most painful at the end of the month. She also reports a sharp pain and a bulge with certain movements in one of the scars from her laparoscopy. She denies vaginal bleeding and s/s of infection. She also would like to discuss her inability to lose weight.  Past Medical History:  Diagnosis Date   Anxiety    NO MEDS   Complication of anesthesia    HARD TIME WAKING UP   Family history of adverse reaction to anesthesia    MOM AND SISTER-N/V   GERD (gastroesophageal reflux disease)    OCC   Headache    MIGRAINES   Heavy periods    History of kidney stones    H/O   Past Surgical History:  Procedure Laterality Date   ABDOMINAL HYSTERECTOMY     CESAREAN SECTION     CESAREAN SECTION WITH BILATERAL TUBAL LIGATION Bilateral 04/24/2018   Procedure: REPEAT CESAREAN SECTION WITH BILATERAL TUBAL LIGATION;  Surgeon: Hildred Laser, MD;  Location: [facility]S;  Service: Obstetrics;  Laterality: Bilateral;  TOB: 12:11 Sex: Female Weight: 7 lb 1 oz   DILATION AND EVACUATION N/A 11/18/2016   Procedure: DILATATION AND EVACUATION;  Surgeon: Hildred Laser, MD;  Location: [facility] ORS;  Service: Gynecology;  Laterality: N/A;   LAPAROSCOPIC UNILATERAL SALPINGO OOPHERECTOMY Left 09/09/2019   Procedure: LAPAROSCOPIC UNILATERAL SALPINGO OOPHORECTOMY;  Surgeon: Hildred Laser, MD;  Location: [facility];  Service: Gynecology;  Laterality: Left;   LAPAROSCOPIC VAGINAL HYSTERECTOMY WITH SALPINGO OOPHORECTOMY Left 09/09/2019   Procedure: LAPAROSCOPIC ASSISTED VAGINAL HYSTERECTOMY WITH BILATERAL SALPINGECTOMY;   Surgeon: Hildred Laser, MD;  Location: [facility];  Service: Gynecology;  Laterality: Left;   LAPAROSCOPY N/A 11/18/2016   Procedure: LAPAROSCOPY DIAGNOSTIC;  Surgeon: Hildred Laser, MD;  Location: [facility];  Service: Gynecology;  Laterality: N/A;   TONSILLECTOMY     OB History     Gravida  3   Para  2   Term  2   Preterm      AB  1   Living  2      SAB  1   IAB      Ectopic      Multiple  0   Live Births  2          Allergies  Allergen Reactions   Adhesive [Tape] Other (See Comments)    Burns skin off--tolerates PAPER TAPE ONLY   ROS:  Negative except as noted in HPI History obtained from the patient  Objective  BP 129/83   Pulse 82   Ht 5\' 2"  (1.575 m)   Wt 178 lb 6.4 oz (80.9 kg)   LMP 08/25/2019 (Exact Date)   BMI 32.63 kg/m   General appearance: alert, cooperative, appears stated age Lungs: clear to auscultation bilaterally Heart: regular rate and rhythm, S1, S2 normal, no murmur, click, rub or gallop Abdomen: soft, no organomegaly, normal bowel sounds. Lower abdomen feels slightly firm, tender to palpation. Laparoscopy scar on left side is tender to palpation, small bulge palpated Pelvic: External genitalia normal, Vagina normal without discharge, vaginal cuff appears slightly erythematous   Assessment 1) Pelvic  pain 2) Dyspareunia 3) Desires weight loss  Plan 1) Discussed comfort measures for pelvic pain and possible adhesions from multiple surgeries. Pelvic US ordered. Recommend follow up with Dr. Valentino Saxon. 2) Discussed increasing lubrication, irritation of vaginal cuff. 3) Refer to Doreene Burke, CNM for weight loss.   Guadlupe Spanish, CNM

## 2021-06-18 LAB — COMPREHENSIVE METABOLIC PANEL
ALT: 17 IU/L (ref 0–32)
AST: 17 IU/L (ref 0–40)
Albumin/Globulin Ratio: 1.9 (ref 1.2–2.2)
Albumin: 4.4 g/dL (ref 3.9–5.0)
Alkaline Phosphatase: 97 IU/L (ref 44–121)
BUN/Creatinine Ratio: 21 (ref 9–23)
BUN: 15 mg/dL (ref 6–20)
Bilirubin Total: 0.3 mg/dL (ref 0.0–1.2)
CO2: 23 mmol/L (ref 20–29)
Calcium: 9.2 mg/dL (ref 8.7–10.2)
Chloride: 103 mmol/L (ref 96–106)
Creatinine, Ser: 0.72 mg/dL (ref 0.57–1.00)
Globulin, Total: 2.3 g/dL (ref 1.5–4.5)
Glucose: 98 mg/dL (ref 70–99)
Potassium: 3.8 mmol/L (ref 3.5–5.2)
Sodium: 141 mmol/L (ref 134–144)
Total Protein: 6.7 g/dL (ref 6.0–8.5)
eGFR: 115 mL/min/{1.73_m2} (ref >59)

## 2021-06-18 LAB — LIPID PANEL
Chol/HDL Ratio: 3.2 ratio (ref 0.0–4.4)
Cholesterol, Total: 185 mg/dL (ref 100–199)
HDL: 57 mg/dL (ref >39)
LDL Chol Calc (NIH): 116 mg/dL — ABNORMAL HIGH (ref 0–99)
Triglycerides: 62 mg/dL (ref 0–149)
VLDL Cholesterol Cal: 12 mg/dL (ref 5–40)

## 2021-06-18 LAB — INSULIN, RANDOM: INSULIN: 58.2 u[IU]/mL — ABNORMAL HIGH (ref 2.6–24.9)

## 2021-06-18 LAB — HEMOGLOBIN A1C
Est. average glucose Bld gHb Est-mCnc: 91 mg/dL
Hgb A1c MFr Bld: 4.8 % (ref 4.8–5.6)

## 2021-06-18 LAB — CBC
Hematocrit: 38.8 % (ref 34.0–46.6)
Hemoglobin: 13.5 g/dL (ref 11.1–15.9)
MCH: 31.3 pg (ref 26.6–33.0)
MCHC: 34.8 g/dL (ref 31.5–35.7)
MCV: 90 fL (ref 79–97)
Platelets: 237 10*3/uL (ref 150–450)
RBC: 4.31 x10E6/uL (ref 3.77–5.28)
RDW: 12.3 % (ref 11.7–15.4)
WBC: 5.8 10*3/uL (ref 3.4–10.8)

## 2021-06-18 LAB — TSH: TSH: 1.6 u[IU]/mL (ref 0.450–4.500)

## 2021-06-22 ENCOUNTER — Other Ambulatory Visit: Payer: 59

## 2021-06-24 ENCOUNTER — Encounter: Payer: 59 | Admitting: Obstetrics and Gynecology

## 2021-07-14 ENCOUNTER — Other Ambulatory Visit (INDEPENDENT_AMBULATORY_CARE_PROVIDER_SITE_OTHER): Payer: 59

## 2021-07-14 ENCOUNTER — Other Ambulatory Visit: Payer: Self-pay | Admitting: Obstetrics and Gynecology

## 2021-07-14 DIAGNOSIS — R102 Pelvic and perineal pain: Secondary | ICD-10-CM | POA: Diagnosis not present

## 2021-07-19 ENCOUNTER — Other Ambulatory Visit: Payer: 59

## 2021-07-21 ENCOUNTER — Encounter: Payer: Self-pay | Admitting: Obstetrics and Gynecology

## 2021-07-21 ENCOUNTER — Ambulatory Visit (INDEPENDENT_AMBULATORY_CARE_PROVIDER_SITE_OTHER): Payer: 59 | Admitting: Obstetrics and Gynecology

## 2021-07-21 VITALS — BP 124/65 | HR 74 | Ht 62.0 in | Wt 180.8 lb

## 2021-07-21 DIAGNOSIS — Z90710 Acquired absence of both cervix and uterus: Secondary | ICD-10-CM

## 2021-07-21 DIAGNOSIS — N941 Unspecified dyspareunia: Secondary | ICD-10-CM | POA: Diagnosis not present

## 2021-07-21 DIAGNOSIS — R102 Pelvic and perineal pain: Secondary | ICD-10-CM

## 2021-07-21 DIAGNOSIS — K439 Ventral hernia without obstruction or gangrene: Secondary | ICD-10-CM

## 2021-07-21 DIAGNOSIS — N808 Other endometriosis: Secondary | ICD-10-CM

## 2021-07-21 NOTE — Progress Notes (Signed)
GYNECOLOGY PROGRESS NOTE  Subjective:    Patient ID: Mary Parsons, female    DOB: 09-17-1990, 31 y.o.   MRN: [id number]  HPI  Patient is a 31 y.o. G60P2012 female who presents for pelvic pain and dyspareunia for the past several months.  She has a prior history LAVH, with LSO.  Also with h/o suspected endometriosis, 2 prior C-sections.  Wonders if her pain is possible adhesions from multiple surgeries. She has been feeling increased pelvic/lower abdominal pain on the right side and some pain with intercourse. The abdominal pain worsens cyclically and is most painful at the end of the month. She also reports a sharp pain and a bulge with certain movements in one of the scars from her laparoscopy. She denies vaginal bleeding and s/s of infection. Ultrasound performed on 07/14/2021.  Indications:pain; S/P Hysterectomy; endometriosis Findings:  The uterus is surgically absent. There are peristalsing loops of bowel seen throughout.   Right Ovary measures 3.9 x 2.1 x 1.9 cm; 1.3 cm dominant follicle. Left Ovary and tube are surgically absent. Survey of the adnexa demonstrates no adnexal masses. There is no free fluid in the cul de sac.   Impression: 1. Normal Right Ovary. 2. Normal appearing post hysterectomy pelvis.   Recommendations: 1.Clinical correlation with the patient's History and Physical Exam.    The following portions of the patient's history were reviewed and updated as appropriate: allergies, current medications, past family history, past medical history, past social history, past surgical history, and problem list.  Review of Systems Pertinent items noted in HPI and remainder of comprehensive ROS otherwise negative.   Objective:   Blood pressure 124/65, pulse 74, height 5\' 2"  (1.575 m), weight 180 lb 12.8 oz (82 kg), last menstrual period 08/25/2019.  Body mass index is 33.07 kg/m. General appearance: alert and no distress Abdomen: soft, non-tender; bowel sounds  normal; no masses,  no organomegaly.  Small palpable lump or nodule located near left laparoscopic port site.  Pelvic: deferred. Please see exam performed by 10/25/2019, CNM on 06/17/2021. No significant findings noted.  Extremities: extremities normal, atraumatic, no cyanosis or edema Neurologic: Grossly normal   Assessment:   1. Pelvic pain   2. History of hysterectomy   3. Other endometriosis   4. Dyspareunia in female   5. Hernia of anterior abdominal wall      Plan:   Patient with h/o pelvic pain and dyspareunia, is now s/p hysterectomy with recurrence of pain. Patient notes pain feels like it did when she had her left ovary.  No findings noted on recent ultrasound. Discussed that this could be a possible flare of endometriosis (although no implants noted on last surgery, diagnosis based on symptoms only). Discussed option of referral back to physical therapy, however patient notes it was not beneficial last time. Offered trial of Orilissa for hormonal suppression. Discussed risks and benefits of medication. Patient ok to try. Will f/u in 1 month to reassess symptoms. Has had recent LFTs.  Patient concerned of possible hernia, noting a bulge most often when picking up her small child, also notes some tightening of left side of her abdomen at times.  Small palpable nodule vs other defect noted near left laparoscopic port site. Based on patient's h/o multiple surgeries and current symptoms, could possibly be developing a hernia. Will order CT scan.     A total of 38 minutes were spent during this encounter, including review of previous progress notes, recent imaging and labs, face-to-face with time  with patient involving counseling and coordination of care, as well as documentation for current visit.     Hildred Laser, MD [facility]

## 2021-07-23 ENCOUNTER — Encounter: Payer: Self-pay | Admitting: Obstetrics and Gynecology

## 2021-07-23 DIAGNOSIS — R102 Pelvic and perineal pain: Secondary | ICD-10-CM

## 2021-08-12 ENCOUNTER — Ambulatory Visit
Admission: RE | Admit: 2021-08-12 | Discharge: 2021-08-12 | Disposition: A | Discharge status: Home / Self Care | Payer: 59 | Source: Ambulatory Visit | Attending: Obstetrics and Gynecology | Admitting: Obstetrics and Gynecology

## 2021-08-12 DIAGNOSIS — R102 Pelvic and perineal pain: Secondary | ICD-10-CM | POA: Insufficient documentation

## 2021-08-12 MED ORDER — IOHEXOL 300 MG/ML  SOLN
100.0000 mL | Freq: Once | INTRAMUSCULAR | Status: AC | PRN
  Administered 2021-08-12: 100 mL via INTRAVENOUS

## 2021-08-24 NOTE — Progress Notes (Unsigned)
    GYNECOLOGY PROGRESS NOTE  Subjective:    Patient ID: Mary Parsons, female    DOB: 1990/12/15, 31 y.o.   MRN: [id number]  HPI  Patient is a 31 y.o. G9P2012 female who presents for follow up on Orlissa.  {Common ambulatory SmartLinks:19316}  Review of Systems {ros; complete:30496}   Objective:   Last menstrual period 08/25/2019. There is no height or weight on file to calculate BMI. General appearance: {general exam:16600} Abdomen: {abdominal exam:16834} Pelvic: {pelvic exam:16852::"cervix normal in appearance","external genitalia normal","no adnexal masses or tenderness","no cervical motion tenderness","rectovaginal septum normal","uterus normal size, shape, and consistency","vagina normal without discharge"} Extremities: {extremity exam:5109} Neurologic: {neuro exam:17854}   Assessment:   No diagnosis found.   Plan:   There are no diagnoses linked to this encounter.   Hildred Laser, MD [facility]

## 2021-08-25 ENCOUNTER — Encounter: Payer: Self-pay | Admitting: Obstetrics and Gynecology

## 2021-08-25 ENCOUNTER — Ambulatory Visit (INDEPENDENT_AMBULATORY_CARE_PROVIDER_SITE_OTHER): Payer: 59 | Admitting: Obstetrics and Gynecology

## 2021-08-25 VITALS — BP 108/61 | HR 73 | Resp 16 | Ht 62.0 in | Wt 174.3 lb

## 2021-08-25 DIAGNOSIS — Z9889 Other specified postprocedural states: Secondary | ICD-10-CM | POA: Diagnosis not present

## 2021-08-25 DIAGNOSIS — N808 Other endometriosis: Secondary | ICD-10-CM

## 2021-08-25 DIAGNOSIS — R102 Pelvic and perineal pain: Secondary | ICD-10-CM | POA: Diagnosis not present

## 2021-08-25 DIAGNOSIS — N941 Unspecified dyspareunia: Secondary | ICD-10-CM | POA: Diagnosis not present

## 2021-08-25 MED ORDER — ORILISSA 200 MG PO TABS: 1.0000 | ORAL_TABLET | Freq: Every day | ORAL | 0 refills | Status: DC

## 2021-08-25 MED ORDER — DANAZOL 200 MG PO CAPS: 200.0000 mg | ORAL_CAPSULE | Freq: Two times a day (BID) | ORAL | 0 refills | Status: DC

## 2021-09-28 ENCOUNTER — Encounter: Payer: Self-pay | Admitting: Obstetrics and Gynecology

## 2021-09-28 ENCOUNTER — Telehealth (INDEPENDENT_AMBULATORY_CARE_PROVIDER_SITE_OTHER): Payer: 59 | Admitting: Obstetrics and Gynecology

## 2021-09-28 VITALS — Resp 16 | Ht 62.0 in | Wt 164.0 lb

## 2021-09-28 DIAGNOSIS — N941 Unspecified dyspareunia: Secondary | ICD-10-CM

## 2021-09-28 DIAGNOSIS — R102 Pelvic and perineal pain: Secondary | ICD-10-CM

## 2021-09-28 DIAGNOSIS — N808 Other endometriosis: Secondary | ICD-10-CM | POA: Diagnosis not present

## 2021-09-28 MED ORDER — DIAZEPAM 10 MG PO TABS
ORAL_TABLET | ORAL | 3 refills | Status: DC
Start: 2021-09-28 — End: 2022-02-22

## 2021-09-28 NOTE — Progress Notes (Signed)
Virtual Visit via Video Note  I connected with [name] on 09/28/21 at  4:00 PM EDT by a video enabled telemedicine application and verified that I am speaking with the correct person using two identifiers.  Location: Patient: Work Provider: Office   I discussed the limitations of evaluation and management by telemedicine and the availability of in person appointments. The patient expressed understanding and agreed to proceed.  History of Present Illness: Mary Parsons is a 31 y.o. [id number] who presents for 1 month follow-up of pelvic pain.  Patient was initiated on danazol last visit after discontinuing Mary Parsons for suspected endometriosis (although not confirmed on laparoscopy).  Patient reports that neither medication has helped much and continues to feel intense pelvic pressure each month.  Notes that it feels "like I am giving birth" every month. Also continues to note dyspareunia.  Patient does have a history of LAVH with LSO.   Observations/Objective: Resp. rate 16, height 5\' 2"  (1.575 m), weight 164 lb (74.4 kg), last menstrual period 08/25/2019.   Assessment and Plan: Pelvic pain -has not been responsive to hysterectomy, 10/25/2019, or danazol so far.  Patient also has a remote history of attempting physical therapy prior to having her surgery, which she also notes was not helpful.  I discussed that perhaps her pelvic pressure/pain may be more neuropathic or due to some level of pelvic floor dysfunction.  I discussed alternative nontraditional approaches such as chiropractic acupuncture.  Also discussed the option of use of vaginal diazepam to see if pelvic relaxation would be helpful for her.  Patient is willing to try this.  Will prescribe.  Additionally notes that she is feeling some level of frustration as she has had negative imaging, surgical findings, and failed trials of medications without any real resolution to her pain.  Notes that this could potentially have an effect  on her history of depression however does not desire to go back on medications at this time.  I did discuss natural over-the-counter supplements that might be beneficial for her to manage the mild mood changes.  Follow Up Instructions:   RTC in 2 -3 months for follow up.   I discussed the assessment and treatment plan with the patient. The patient was provided an opportunity to ask questions and all were answered. The patient agreed with the plan and demonstrated an understanding of the instructions.   The patient was advised to call back or seek an in-person evaluation if the symptoms worsen or if the condition fails to improve as anticipated.  I provided 12 minutes of non-face-to-face time during this encounter.   Mary Hatch, MD [facility]

## 2021-09-28 NOTE — Patient Instructions (Signed)
NATURAL MOOD BOOSTING SUPPLEMENTS  OLLY Hello Happy Gummies Ashwaganda SAM-e Happy Healthy Hippie product: Joy-Filled ([employer])

## 2021-10-22 ENCOUNTER — Telehealth: Payer: Self-pay | Admitting: Obstetrics and Gynecology

## 2021-10-22 NOTE — Telephone Encounter (Signed)
I contacted patient via phone. I left voicemail for patient to call back to be scheduled.   

## 2021-10-22 NOTE — Telephone Encounter (Signed)
-----   Message from Rubie Maid, MD sent at 09/28/2021  4:59 PM EDT ----- Regarding: schedule appointment Please schedule patient for f/u appointment in 2 months for pelvic pain.   Dr. Marcelline Mates

## 2022-02-21 NOTE — Progress Notes (Unsigned)
Diona Fanti, CNM   No chief complaint on file.   HPI:      Mary Parsons is a 32 y.o. [id number] whose LMP was Patient's last menstrual period was 08/25/2019 (exact date)., presents today for ***  She was started on Orlissa for pelvic pain and dyspareunia. She has a prior history LAVH, with LSO.  Also with h/o suspected endometriosis, 2 prior C-sections. She reports that Edward Qualia is not working well for her. She continues to have pain with intercourse and cramping. Pain is mostly right sided.  Has done physical therapy in the past which she also notes did not help.   Patient Active Problem List   Diagnosis Date Noted   S/P laparoscopic hysterectomy 09/09/2019   Left ovarian cyst 09/13/2018   History of miscarriage 10/12/2017   History of C-section 10/09/2017   Pelvic pain 09/25/2016   Dyspareunia in female 09/25/2016   Family history of endometriosis 08/15/2016   Eczema 11/11/2013    Past Surgical History:  Procedure Laterality Date   ABDOMINAL HYSTERECTOMY     CESAREAN SECTION     CESAREAN SECTION WITH BILATERAL TUBAL LIGATION Bilateral 04/24/2018   Procedure: REPEAT CESAREAN SECTION WITH BILATERAL TUBAL LIGATION;  Surgeon: Rubie Maid, MD;  Location: [facility];  Service: Obstetrics;  Laterality: Bilateral;  TOB: 12:11 Sex: Female Weight: 7 lb 1 oz   DILATION AND EVACUATION N/A 11/18/2016   Procedure: DILATATION AND EVACUATION;  Surgeon: Rubie Maid, MD;  Location: [facility];  Service: Gynecology;  Laterality: N/A;   LAPAROSCOPIC UNILATERAL SALPINGO OOPHERECTOMY Left 09/09/2019   Procedure: LAPAROSCOPIC UNILATERAL SALPINGO OOPHORECTOMY;  Surgeon: Rubie Maid, MD;  Location: [facility];  Service: Gynecology;  Laterality: Left;   LAPAROSCOPIC VAGINAL HYSTERECTOMY WITH SALPINGO OOPHORECTOMY Left 09/09/2019   Procedure: LAPAROSCOPIC ASSISTED VAGINAL HYSTERECTOMY WITH BILATERAL SALPINGECTOMY;  Surgeon: Rubie Maid, MD;  Location: [facility];  Service: Gynecology;   Laterality: Left;   LAPAROSCOPY N/A 11/18/2016   Procedure: LAPAROSCOPY DIAGNOSTIC;  Surgeon: Rubie Maid, MD;  Location: [facility];  Service: Gynecology;  Laterality: N/A;   TONSILLECTOMY      Family History  Problem Relation Age of Onset   Endometriosis Mother    Endometriosis Maternal Aunt    Endometriosis Maternal Grandmother    Healthy Father    Breast cancer Neg Hx    Ovarian cancer Neg Hx    Colon cancer Neg Hx     Social History   Socioeconomic History   Marital status: Married    Spouse name: Not on file   Number of children: Not on file   Years of education: Not on file   Highest education level: Not on file  Occupational History   Not on file  Tobacco Use   Smoking status: Never   Smokeless tobacco: Never  Vaping Use   Vaping Use: Never used  Substance and Sexual Activity   Alcohol use: Yes    Comment: occasional    Drug use: No   Sexual activity: Yes    Birth control/protection: Surgical    Comment: Tubal ligation/hysterectomy  Other Topics Concern   Not on file  Social History Narrative   Not on file   Social Determinants of Health   Financial Resource Strain: Not on file  Food Insecurity: Not on file  Transportation Needs: Not on file  Physical Activity: Not on file  Stress: Not on file  Social Connections: Not on file  Intimate Partner Violence: Not on file    Outpatient Medications  Prior to Visit  Medication Sig Dispense Refill   danazol (DANOCRINE) 200 MG capsule Take 1 capsule (200 mg total) by mouth 2 (two) times daily. Take 1 tablet daily for first week, then increase to 1 tablet twice daily. 90 capsule 0   diazepam (VALIUM) 10 MG tablet Apply 1 tablet vaginally every night for 4 weeks, then decrease to 1 tablet once or twice weekly. 30 tablet 3   No facility-administered medications prior to visit.      ROS:  Review of Systems BREAST: No symptoms   OBJECTIVE:   Vitals:  LMP 08/25/2019 (Exact Date)   Physical  Exam  Results: No results found for this or any previous visit (from the past 24 hour(s)).   Assessment/Plan: No diagnosis found.    No orders of the defined types were placed in this encounter.     No follow-ups on file.  Jhana Giarratano B. Keante Urizar, PA-C 02/21/2022 4:56 PM

## 2022-02-22 ENCOUNTER — Encounter: Payer: Self-pay | Admitting: Obstetrics and Gynecology

## 2022-02-22 ENCOUNTER — Ambulatory Visit (INDEPENDENT_AMBULATORY_CARE_PROVIDER_SITE_OTHER): Payer: 59 | Admitting: Obstetrics and Gynecology

## 2022-02-22 VITALS — BP 100/60 | Ht 62.0 in | Wt 167.0 lb

## 2022-02-22 DIAGNOSIS — R1031 Right lower quadrant pain: Secondary | ICD-10-CM | POA: Diagnosis not present

## 2022-02-22 DIAGNOSIS — N9089 Other specified noninflammatory disorders of vulva and perineum: Secondary | ICD-10-CM | POA: Diagnosis not present

## 2022-02-22 DIAGNOSIS — Z124 Encounter for screening for malignant neoplasm of cervix: Secondary | ICD-10-CM

## 2022-02-22 DIAGNOSIS — G8929 Other chronic pain: Secondary | ICD-10-CM

## 2022-02-22 DIAGNOSIS — Z1151 Encounter for screening for human papillomavirus (HPV): Secondary | ICD-10-CM

## 2022-02-22 MED ORDER — BACLOFEN 5 MG PO TABS: 10.0000 mg | ORAL_TABLET | Freq: Two times a day (BID) | ORAL | 0 refills | Status: DC | PRN

## 2022-02-22 NOTE — Patient Instructions (Signed)
I value your feedback and you entrusting us with your care. If you get a [facility] patient survey, I would appreciate you taking the time to let us know about your experience today. Thank you! ? ? ?

## 2022-12-26 NOTE — Progress Notes (Unsigned)
    GYNECOLOGY PROGRESS NOTE  Subjective:    Patient ID: Mary Parsons, female    DOB: 1990-11-15, 32 y.o.   MRN: [id number]  HPI  Patient is a 32 y.o. G78P2012 female who presents for recurrent bartholin's cyst.  She has been seen in the remote past for Bartholin's cysts however has not had to deal with one in some time. Notes that over the past 3 months she has had one, each rupturing or resolving on its own prior to being able to be seen.  She feels pressure and cramping with lesion. It has white mucous drainage.  Notes that she has not tried sitz baths/warm compresses. Denies increased vag discharge, odor. Most recent recurrence she believes may have started draining 2 days ago.   Patient also reports issues with pelvic pain. Notes history of pain and dyspareunia in the past, is s/p hysterectomy. Also had issues with ovarian cysts.  Thinks another one may be developing.    The following portions of the patient's history were reviewed and updated as appropriate: allergies, current medications, past family history, past medical history, past social history, past surgical history, and problem list.  Review of Systems Pertinent items are noted in HPI.   Objective:   Blood pressure 121/64, pulse 75, resp. rate 16, height 5\' 2"  (1.575 m), weight 148 lb 4.8 oz (67.3 kg), last menstrual period 08/25/2019.  Body mass index is 27.12 kg/m.  General appearance: alert, cooperative, and no distress Abdomen:  soft, mildly tender in LRQ with palpation. No distention.   Pelvic: external genitalia normal, without lesions, slight irregularity noted of right labia minora with a hint of swelling. No drainage expressed on palpation. Internal exam not performed.  Extremities: extremities normal, atraumatic, no cyanosis or edema Neurologic: Grossly normal   Assessment:   1. Bartholin gland cyst   2. Pelvic pain      Plan:   1. Bartholin gland cyst - Patient reports h/o recurrent Bartholin's cysts  over the past several months. Also with h/o cysts in the past. Discussed home care remedies and hygiene (decrease use of thong underwear for a while). No obvious cyst or drainage noted on exam. Discussed treatment with oral antibiotic to clear vaginal flora of bacteria, patient ok to try. Can also use tea-tree oil topically.   2. Pelvic pain - Patient with h/o pelvic pain, ovarian cysts in the past. Now with new pain. Will order pelvic ultrasound.  - POCT Urinalysis Dipstick - US PELVIC COMPLETE WITH TRANSVAGINAL; Future    Hildred Laser, MD [facility]

## 2022-12-27 ENCOUNTER — Ambulatory Visit (INDEPENDENT_AMBULATORY_CARE_PROVIDER_SITE_OTHER): Payer: Self-pay | Admitting: Obstetrics and Gynecology

## 2022-12-27 ENCOUNTER — Encounter: Payer: Self-pay | Admitting: Obstetrics and Gynecology

## 2022-12-27 VITALS — BP 121/64 | HR 75 | Resp 16 | Ht 62.0 in | Wt 148.3 lb

## 2022-12-27 DIAGNOSIS — R102 Pelvic and perineal pain: Secondary | ICD-10-CM

## 2022-12-27 DIAGNOSIS — N750 Cyst of Bartholin's gland: Secondary | ICD-10-CM

## 2023-01-09 DIAGNOSIS — H66001 Acute suppurative otitis media without spontaneous rupture of ear drum, right ear: Secondary | ICD-10-CM | POA: Diagnosis not present

## 2023-01-16 DIAGNOSIS — H6091 Unspecified otitis externa, right ear: Secondary | ICD-10-CM | POA: Diagnosis not present

## 2023-01-24 ENCOUNTER — Other Ambulatory Visit: Payer: Self-pay

## 2023-02-13 ENCOUNTER — Telehealth: Payer: Self-pay | Admitting: Physician Assistant

## 2023-02-13 DIAGNOSIS — J111 Influenza due to unidentified influenza virus with other respiratory manifestations: Secondary | ICD-10-CM | POA: Diagnosis not present

## 2023-02-13 MED ORDER — OSELTAMIVIR PHOSPHATE 75 MG PO CAPS: 75.0000 mg | ORAL_CAPSULE | Freq: Two times a day (BID) | ORAL | 0 refills | Status: AC

## 2023-02-13 NOTE — Patient Instructions (Signed)
  Mary Parsons, thank you for joining Tylene Fantasia Ward, PA-C for today's virtual visit.  While this provider is not your primary care provider (PCP), if your PCP is located in our provider database this encounter information will be shared with them immediately following your visit.   A [facility] MyChart account gives you access to today's visit and all your visits, tests, and labs performed at [facility] " click here if you don't have a [facility] MyChart account or go to [email or web address]  Consent: (Patient) Mary Parsons provided verbal consent for this virtual visit at the beginning of the encounter.  Current Medications:  Current Outpatient Medications:    oseltamivir (TAMIFLU) 75 MG capsule, Take 1 capsule (75 mg total) by mouth 2 (two) times daily for 5 days., Disp: 10 capsule, Rfl: 0   Medications ordered in this encounter:  Meds ordered this encounter  Medications   oseltamivir (TAMIFLU) 75 MG capsule    Sig: Take 1 capsule (75 mg total) by mouth 2 (two) times daily for 5 days.    Dispense:  10 capsule    Refill:  0    Supervising Provider:   Merrilee Jansky [[id number]]     *If you need refills on other medications prior to your next appointment, please contact your pharmacy*  Follow-Up: Call back or seek an in-person evaluation if the symptoms worsen or if the condition fails to improve as anticipated.  [facility] [phone]  Other Instructions Take Tamiflu as prescribed.  Continue with supportive care.  Recommend Flonase and Mucinex for congestion.  Recommend Delsym cough syrup which is over the counter. Can take Ibuprofen or Tylenol as needed for body aches or fever. Drink plenty of fluids, rest.     If you have been instructed to have an in-person evaluation today at a local Urgent Care facility, please use the link below. It will take you to a list of all of our available [facility], including address, phone  number and hours of operation. Please do not delay care.  [facility]  If you or a family member do not have a primary care provider, use the link below to schedule a visit and establish care. When you choose a [facility] primary care physician or advanced practice provider, you gain a long-term partner in health. Find a Primary Care Provider  Learn more about [facility]'s in-office and virtual care options: [facility] - Get Care Now

## 2023-02-13 NOTE — Progress Notes (Signed)
Virtual Visit Consent   Mary Parsons, you are scheduled for a virtual visit with a [facility] provider today. Just as with appointments in the office, your consent must be obtained to participate. Your consent will be active for this visit and any virtual visit you may have with one of our providers in the next 365 days. If you have a MyChart account, a copy of this consent can be sent to you electronically.  As this is a virtual visit, video technology does not allow for your provider to perform a traditional examination. This may limit your provider's ability to fully assess your condition. If your provider identifies any concerns that need to be evaluated in person or the need to arrange testing (such as labs, EKG, etc.), we will make arrangements to do so. Although advances in technology are sophisticated, we cannot ensure that it will always work on either your end or our end. If the connection with a video visit is poor, the visit may have to be switched to a telephone visit. With either a video or telephone visit, we are not always able to ensure that we have a secure connection.  By engaging in this virtual visit, you consent to the provision of healthcare and authorize for your insurance to be billed (if applicable) for the services provided during this visit. Depending on your insurance coverage, you may receive a charge related to this service.  I need to obtain your verbal consent now. Are you willing to proceed with your visit today? Mary Parsons has provided verbal consent on 02/13/2023 for a virtual visit (video or telephone). Tylene Fantasia Ward, PA-C  Date: 02/13/2023 6:35 PM  Virtual Visit via Video Note   I, Tylene Fantasia Ward, connected with  Mary Parsons  ([id number], August 10, 1990) on 02/13/23 at  6:30 PM EST by a video-enabled telemedicine application and verified that I am speaking with the correct person using two identifiers.  Location: Patient: Virtual Visit Location  Patient: Home Provider: Virtual Visit Location Provider: Home Office   I discussed the limitations of evaluation and management by telemedicine and the availability of in person appointments. The patient expressed understanding and agreed to proceed.    History of Present Illness: Mary Parsons is a 33 y.o. who identifies as a female who was assigned female at birth, and is being seen today for body aches, chills, fever, congestion that started started today.  She reports mild cough has started today.  She denies wheezing or cough.  Her son recently tested positive for Flu A.  HPI: HPI  Problems:  Patient Active Problem List   Diagnosis Date Noted   S/P laparoscopic hysterectomy 09/09/2019   Left ovarian cyst 09/13/2018   History of miscarriage 10/12/2017   History of C-section 10/09/2017   Pelvic pain 09/25/2016   Dyspareunia in female 09/25/2016   Family history of endometriosis 08/15/2016   Eczema 11/11/2013    Allergies:  Allergies  Allergen Reactions   Adhesive [Tape] Other (See Comments)    Burns skin off--tolerates PAPER TAPE ONLY   Medications:  Current Outpatient Medications:    oseltamivir (TAMIFLU) 75 MG capsule, Take 1 capsule (75 mg total) by mouth 2 (two) times daily for 5 days., Disp: 10 capsule, Rfl: 0  Observations/Objective: Patient is well-developed, well-nourished in no acute distress.  Resting comfortably  at home.  Head is normocephalic, atraumatic.  No labored breathing.  Speech is clear and coherent with logical content.  Patient is alert and oriented at baseline.  Assessment and Plan: 1. Influenza (Primary)  Pt with positive exposure, symptomatic.  Will send in tamiflu.  Supportive care discussed.  Pt overall well appearing, no acute distress.   Follow Up Instructions: I discussed the assessment and treatment plan with the patient. The patient was provided an opportunity to ask questions and all were answered. The patient agreed with the  plan and demonstrated an understanding of the instructions.  A copy of instructions were sent to the patient via MyChart unless otherwise noted below.     The patient was advised to call back or seek an in-person evaluation if the symptoms worsen or if the condition fails to improve as anticipated.    Tylene Fantasia Ward, PA-C

## 2023-02-24 ENCOUNTER — Telehealth: Payer: Self-pay | Admitting: Obstetrics and Gynecology

## 2023-02-24 ENCOUNTER — Other Ambulatory Visit: Payer: BC Managed Care – PPO

## 2023-02-24 NOTE — Telephone Encounter (Signed)
 Reached out to pt about GYN US  that was scheduled on 02/24/2023 at 4:00 (per Dr. Denman Fischer).  Left message for pt to call back.

## 2023-02-27 ENCOUNTER — Encounter: Payer: Self-pay | Admitting: Obstetrics and Gynecology

## 2023-02-27 NOTE — Telephone Encounter (Signed)
 Reached out to pt (2x) about GYN US  that was scheduled on 02/24/2023 at 4:00 (per Dr. Denman Fischer).  Gave pt the number for Centralized Schedule so she could schedule at  her convenience.

## 2023-03-07 DIAGNOSIS — S8002XA Contusion of left knee, initial encounter: Secondary | ICD-10-CM | POA: Diagnosis not present

## 2023-03-17 ENCOUNTER — Encounter: Payer: Self-pay | Admitting: Obstetrics and Gynecology

## 2023-03-17 NOTE — Telephone Encounter (Signed)
 Can you send in an antibiotic for bartholin cyst or should she be seen. She has had in the past. Please advise

## 2023-03-23 DIAGNOSIS — T7840XA Allergy, unspecified, initial encounter: Secondary | ICD-10-CM | POA: Diagnosis not present

## 2023-03-24 DIAGNOSIS — J301 Allergic rhinitis due to pollen: Secondary | ICD-10-CM | POA: Diagnosis not present

## 2023-03-24 DIAGNOSIS — J305 Allergic rhinitis due to food: Secondary | ICD-10-CM | POA: Diagnosis not present

## 2023-10-04 DIAGNOSIS — J029 Acute pharyngitis, unspecified: Secondary | ICD-10-CM | POA: Diagnosis not present

## 2023-10-04 DIAGNOSIS — B0223 Postherpetic polyneuropathy: Secondary | ICD-10-CM | POA: Diagnosis not present
